# Patient Record
Sex: Female | Born: 1985 | Race: White | Hispanic: No | Smoking: Current every day smoker
Health system: Southern US, Community
[De-identification: ages and names within clinical notes are randomized; demographics above are authoritative.]

## PROBLEM LIST (undated history)

## (undated) ENCOUNTER — Ambulatory Visit: Discharge status: Absent without leave | Payer: 59

## (undated) DIAGNOSIS — Z8742 Personal history of other diseases of the female genital tract: Secondary | ICD-10-CM

## (undated) DIAGNOSIS — N200 Calculus of kidney: Secondary | ICD-10-CM

## (undated) DIAGNOSIS — J45909 Unspecified asthma, uncomplicated: Secondary | ICD-10-CM

## (undated) DIAGNOSIS — F419 Anxiety disorder, unspecified: Secondary | ICD-10-CM

## (undated) DIAGNOSIS — Z1379 Encounter for other screening for genetic and chromosomal anomalies: Secondary | ICD-10-CM

## (undated) DIAGNOSIS — G43909 Migraine, unspecified, not intractable, without status migrainosus: Secondary | ICD-10-CM

## (undated) DIAGNOSIS — R519 Headache, unspecified: Secondary | ICD-10-CM

## (undated) DIAGNOSIS — G932 Benign intracranial hypertension: Secondary | ICD-10-CM

## (undated) DIAGNOSIS — Z87442 Personal history of urinary calculi: Secondary | ICD-10-CM

## (undated) DIAGNOSIS — N390 Urinary tract infection, site not specified: Secondary | ICD-10-CM

## (undated) HISTORY — DX: Encounter for other screening for genetic and chromosomal anomalies: Z13.79

## (undated) HISTORY — PX: COLONOSCOPY WITH ESOPHAGOGASTRODUODENOSCOPY (EGD): SHX5779

## (undated) HISTORY — DX: Anxiety disorder, unspecified: F41.9

## (undated) HISTORY — PX: TONSILLECTOMY: SUR1361

## (undated) HISTORY — DX: Urinary tract infection, site not specified: N39.0

## (undated) HISTORY — DX: Calculus of kidney: N20.0

## (undated) HISTORY — PX: KNEE SURGERY: SHX244

## (undated) HISTORY — DX: Benign intracranial hypertension: G93.2

## (undated) HISTORY — DX: Headache, unspecified: R51.9

## (undated) HISTORY — DX: Personal history of other diseases of the female genital tract: Z87.42

---

## 2017-06-15 DIAGNOSIS — F419 Anxiety disorder, unspecified: Secondary | ICD-10-CM | POA: Insufficient documentation

## 2017-06-24 ENCOUNTER — Ambulatory Visit (INDEPENDENT_AMBULATORY_CARE_PROVIDER_SITE_OTHER): Payer: 59 | Admitting: Family Medicine

## 2017-06-24 ENCOUNTER — Encounter: Payer: Self-pay | Admitting: Family Medicine

## 2017-06-24 VITALS — BP 122/82 | HR 80 | Temp 97.6°F | Ht 66.5 in | Wt 256.0 lb

## 2017-06-24 DIAGNOSIS — Z13220 Encounter for screening for lipoid disorders: Secondary | ICD-10-CM

## 2017-06-24 DIAGNOSIS — Z23 Encounter for immunization: Secondary | ICD-10-CM | POA: Diagnosis not present

## 2017-06-24 DIAGNOSIS — N946 Dysmenorrhea, unspecified: Secondary | ICD-10-CM

## 2017-06-24 DIAGNOSIS — F410 Panic disorder [episodic paroxysmal anxiety] without agoraphobia: Secondary | ICD-10-CM

## 2017-06-24 DIAGNOSIS — Z7689 Persons encountering health services in other specified circumstances: Secondary | ICD-10-CM

## 2017-06-24 DIAGNOSIS — L709 Acne, unspecified: Secondary | ICD-10-CM | POA: Diagnosis not present

## 2017-06-24 DIAGNOSIS — Z86010 Personal history of colonic polyps: Secondary | ICD-10-CM

## 2017-06-24 DIAGNOSIS — L918 Other hypertrophic disorders of the skin: Secondary | ICD-10-CM | POA: Diagnosis not present

## 2017-06-24 DIAGNOSIS — Z6841 Body Mass Index (BMI) 40.0 and over, adult: Secondary | ICD-10-CM | POA: Diagnosis not present

## 2017-06-24 DIAGNOSIS — F339 Major depressive disorder, recurrent, unspecified: Secondary | ICD-10-CM

## 2017-06-24 DIAGNOSIS — G932 Benign intracranial hypertension: Secondary | ICD-10-CM

## 2017-06-24 LAB — COMPREHENSIVE METABOLIC PANEL
ALT: 15 U/L (ref 0–35)
AST: 12 U/L (ref 0–37)
Albumin: 4.5 g/dL (ref 3.5–5.2)
Alkaline Phosphatase: 60 U/L (ref 39–117)
BUN: 10 mg/dL (ref 6–23)
CO2: 26 mEq/L (ref 19–32)
Calcium: 9.9 mg/dL (ref 8.4–10.5)
Chloride: 109 mEq/L (ref 96–112)
Creatinine, Ser: 0.8 mg/dL (ref 0.40–1.20)
GFR: 87.71 mL/min (ref >60.00)
Glucose, Bld: 93 mg/dL (ref 70–99)
Potassium: 4.9 mEq/L (ref 3.5–5.1)
Sodium: 144 mEq/L (ref 135–145)
Total Bilirubin: 0.7 mg/dL (ref 0.2–1.2)
Total Protein: 7 g/dL (ref 6.0–8.3)

## 2017-06-24 LAB — HEMOGLOBIN A1C: Hgb A1c MFr Bld: 5.4 % (ref 4.6–6.5)

## 2017-06-24 LAB — VITAMIN D 25 HYDROXY (VIT D DEFICIENCY, FRACTURES): VITD: 44.12 ng/mL (ref 30.00–100.00)

## 2017-06-24 LAB — CBC
HCT: 45.2 % (ref 36.0–46.0)
Hemoglobin: 14.9 g/dL (ref 12.0–15.0)
MCHC: 32.9 g/dL (ref 30.0–36.0)
MCV: 91 fl (ref 78.0–100.0)
Platelets: 163 10*3/uL (ref 150.0–400.0)
RBC: 4.97 Mil/uL (ref 3.87–5.11)
RDW: 14 % (ref 11.5–15.5)
WBC: 8.1 10*3/uL (ref 4.0–10.5)

## 2017-06-24 LAB — LIPID PANEL
CHOL/HDL RATIO: 6
Cholesterol: 189 mg/dL (ref 0–200)
HDL: 31.6 mg/dL — AB (ref >39.00)
LDL CALC: 132 mg/dL — AB (ref 0–99)
NONHDL: 157.34
TRIGLYCERIDES: 125 mg/dL (ref 0.0–149.0)
VLDL: 25 mg/dL (ref 0.0–40.0)

## 2017-06-24 LAB — TSH: TSH: 2.16 u[IU]/mL (ref 0.35–4.50)

## 2017-06-24 MED ORDER — ALPRAZOLAM 0.5 MG PO TABS: 0.5000 mg | ORAL_TABLET | Freq: Every day | ORAL | 0 refills | Status: DC

## 2017-06-24 NOTE — Progress Notes (Signed)
Subjective:    Patient ID: Yesenia Beasley, female    DOB: 1986/07/19, 31 y.o.   MRN: [id number]  HPI This is a 31 yo female who presents today to establish care. She is originally  from [place]. She moved her with her boyfriend from [place] last month. 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 Has two dogs, enjoys sports, crafts.   Pseudotumor cerebri- Has history of pseudotumor cerebri. Has regular opthalmologic care, but has not see neurologist in long time. Is getting name of local ophthalmologist from prior provider but requests referral to neuro.   Depression/anxiety- "predispositioned" to stress, has panic disorder. Has been in CBT for many years, has appointment with Dr. Ala Bent (psychiatrist) next week. She lost her mother in 2016, has been rough couple of years. Feels like higher dose of sertraline makes her flat. Currently taking 50 mg. Takes alprazolam 0.5 mg at bedtime. When she tries to decrease, she has vertigo, nausea, increased anxiety. Used to take 0.5 mg QID. Recently has had increased anxiety around getting her medications.   Nephrolithiasis- kidney stones 2008, no recent issues  GI problems started after kidney stones, had colonoscopy and was found to have polyps. - She is due for colonoscopy, requests referral   Skin tag- on left eyelid, wishes to have removed, no drainage, no redness, getting bigger  Adult acne- thinks related to increased stress  Dysmenorrhea- unable to use hormonal contraception due to pseudotumor cerebri. Currently uses condoms. Interested in copper IUD, will let me know is she wants referral.   Past Medical History:  Diagnosis Date  . Anxiety    Past Surgical History:  Procedure Laterality Date  . KNEE SURGERY Bilateral 2002,2003,2014   Family History  Problem Relation Age of Onset  . Cancer Mother   . Heart disease Father    Social History  Substance Use Topics  . Smoking status: Current Every Day Smoker  . Smokeless tobacco: Never Used  .  Alcohol use Yes     Comment: pcc      Review of Systems Per HPI    Objective:   Physical Exam Physical Exam  Constitutional: Oriented to person, place, and time. She appears well-developed and well-nourished.  HENT:  Head: Normocephalic and atraumatic.  Eyes: Conjunctivae are normal.  Neck: Normal range of motion. Neck supple.  Cardiovascular: Normal rate, regular rhythm and normal heart sounds.   Pulmonary/Chest: Effort normal and breath sounds normal.  Musculoskeletal: Normal range of motion.  Neurological: Alert and oriented to person, place, and time.  Skin: Skin is warm and dry. Skin tag on left upper eye lid. Scattered acne along jaw line.  Psychiatric: Normal mood and affect. Behavior is normal. Judgment and thought content normal.  Vitals reviewed.     BP 122/82 (BP Location: Left Arm, Patient Position: Sitting, Cuff Size: Large)   Pulse 80   Temp 97.6 F (36.4 C) (Oral)   Ht 5' 6.5" (1.689 m)   Wt 256 lb (116.1 kg)   LMP 06/12/2017   SpO2 96%   BMI 40.70 kg/m      Assessment & Plan:  1. Encounter to establish care - will request records - Discussed and encouraged healthy lifestyle choices- adequate sleep, regular exercise, stress management and healthy food choices.   2. Dysmenorrhea - CBC - Comprehensive metabolic panel - TSH  3. Skin tag - Ambulatory referral to Dermatology  4. Adult acne - Ambulatory referral to Dermatology  5. Panic disorder - [employer] revealed one fill  of alprazolam 06/08/17 for #30 - Discussed potential for abuse and tolerance - establish with psychiatry as planned - ALPRAZolam (XANAX) 0.5 MG tablet; Take 1 tablet (0.5 mg total) by mouth at bedtime.  Dispense: 30 tablet; Refill: 0 - TSH - encouraged regular exercise, adequate sleep, healthy food choices  6. BMI 40.0-44.9, adult (HCC) - CBC - Comprehensive metabolic panel - Hemoglobin A1c - Lipid panel - TSH - VITAMIN D 25 Hydroxy (Vit-D Deficiency,  Fractures)  7. Need for influenza vaccination - Flu Vaccine QUAD 6+ mos PF IM (Fluarix Quad PF)  8. Pseudotumor cerebri - she will let me know name of local opthalamologist - Ambulatory referral to Neurology  9. History of colon polyps - Ambulatory referral to Gastroenterology  - follow up in 3 months Clarene Reamer, FNP-BC  [facility]  06/25/2017 12:19 PM

## 2017-06-24 NOTE — Patient Instructions (Signed)
It was a pleasure to meet you today! I look forward to partnering with you for your health care needs  Please schedule a follow up appointment in 3 months  Return your paperwork so I can request records  I will put in referral to dermatology, neurology, gastroenterology  Please get name of opthomalogist and I will place referral

## 2017-06-25 DIAGNOSIS — F339 Major depressive disorder, recurrent, unspecified: Secondary | ICD-10-CM | POA: Insufficient documentation

## 2017-06-25 DIAGNOSIS — F410 Panic disorder [episodic paroxysmal anxiety] without agoraphobia: Secondary | ICD-10-CM | POA: Insufficient documentation

## 2017-06-25 DIAGNOSIS — Z86010 Personal history of colonic polyps: Secondary | ICD-10-CM | POA: Insufficient documentation

## 2017-06-25 DIAGNOSIS — G932 Benign intracranial hypertension: Secondary | ICD-10-CM | POA: Insufficient documentation

## 2017-06-25 DIAGNOSIS — N946 Dysmenorrhea, unspecified: Secondary | ICD-10-CM | POA: Insufficient documentation

## 2017-07-02 ENCOUNTER — Encounter: Payer: Self-pay | Admitting: Family Medicine

## 2017-07-03 ENCOUNTER — Telehealth: Payer: Self-pay | Admitting: Family Medicine

## 2017-07-03 ENCOUNTER — Other Ambulatory Visit: Payer: Self-pay | Admitting: Family Medicine

## 2017-07-03 MED ORDER — ALBUTEROL SULFATE HFA 108 (90 BASE) MCG/ACT IN AERS: 2.0000 | INHALATION_SPRAY | Freq: Four times a day (QID) | RESPIRATORY_TRACT | 2 refills | Status: DC | PRN

## 2017-07-03 NOTE — Telephone Encounter (Signed)
Pt is asking for a prescription for Pro Air rescue inhaler, due to insurance no longer covering Ventolin inhaler.Pt can not find her current inhaler, which Ventolin and wants to have an inhaler for the weekend. Pt is willing to have a refill of Ventolin, and pay the cost her insurance will not cover in order to have a rescue inhaler for the weekend.

## 2017-07-03 NOTE — Telephone Encounter (Signed)
Please call patient and let her know that I sent in refill of albuterol- pharmacy should fill whatever generic is on her formulary.

## 2017-07-03 NOTE — Telephone Encounter (Signed)
Copied from [place]. Topic: Quick Communication - See Telephone Encounter >> Jul 03, 2017 11:49 AM Bea Graff, NT wrote: CRM for notification. See Telephone encounter for:  07/03/17. Patient is needing refill of her rescue inhaler before the weekend. Insurance wants her to use ProAir. Please call pt when ready for pick up.

## 2017-07-03 NOTE — Telephone Encounter (Signed)
Called and spoke with pt. Understanding verbalized nothing further needed at this time.

## 2017-08-05 ENCOUNTER — Other Ambulatory Visit: Payer: Self-pay | Admitting: Family Medicine

## 2017-08-05 DIAGNOSIS — F410 Panic disorder [episodic paroxysmal anxiety] without agoraphobia: Secondary | ICD-10-CM

## 2017-08-11 ENCOUNTER — Telehealth: Payer: Self-pay | Admitting: Family Medicine

## 2017-08-11 ENCOUNTER — Other Ambulatory Visit: Payer: Self-pay | Admitting: Family Medicine

## 2017-08-11 DIAGNOSIS — F410 Panic disorder [episodic paroxysmal anxiety] without agoraphobia: Secondary | ICD-10-CM

## 2017-08-11 NOTE — Telephone Encounter (Signed)
Copied from [place] [id number]. Topic: Quick Communication - See Telephone Encounter >> Aug 11, 2017  4:12 PM Bea Graff, NT wrote: CRM for notification. See Telephone encounter for: Pt requesting a refill of her Xanax. [facility] [address]. She only has 1 pill left.  08/11/17.

## 2017-08-12 ENCOUNTER — Other Ambulatory Visit: Payer: Self-pay | Admitting: Family Medicine

## 2017-08-12 DIAGNOSIS — F410 Panic disorder [episodic paroxysmal anxiety] without agoraphobia: Secondary | ICD-10-CM

## 2017-08-12 MED ORDER — ALPRAZOLAM 0.5 MG PO TABS: 0.5000 mg | ORAL_TABLET | Freq: Every day | ORAL | 1 refills | Status: DC

## 2017-08-12 NOTE — Telephone Encounter (Signed)
Pt requesting xanax refill.

## 2017-08-12 NOTE — Telephone Encounter (Signed)
Per phone note pt requesting refill xanax; pt has one pill left. Last refilled # 30 on 06/24/17 and pt new pt established on 06/24/17.Please advise.

## 2017-08-12 NOTE — Progress Notes (Signed)
Prescription called in to pharmacy

## 2017-08-12 NOTE — Telephone Encounter (Signed)
I have placed order to be phoned in today.

## 2017-08-12 NOTE — Telephone Encounter (Signed)
See refill request 08/11/17.

## 2017-08-25 ENCOUNTER — Other Ambulatory Visit: Payer: Self-pay

## 2017-08-25 DIAGNOSIS — Z86010 Personal history of colonic polyps: Secondary | ICD-10-CM

## 2017-08-25 NOTE — Progress Notes (Signed)
Gastroenterology Pre-Procedure Review  Request Date: 10/05/17 Requesting Physician: Dr. Vicente Males  PATIENT REVIEW QUESTIONS: The patient responded to the following health history questions as indicated:    1. Are you having any GI issues? no 2. Do you have a personal history of Polyps? yes (3-5 years ago) 3. Do you have a family history of Colon Cancer or Polyps? no 4. Diabetes Mellitus? no 5. Joint replacements in the past 12 months?no 6. Major health problems in the past 3 months?no 7. Any artificial heart valves, MVP, or defibrillator?no    MEDICATIONS & ALLERGIES:    Patient reports the following regarding taking any anticoagulation/antiplatelet therapy:   Plavix, Coumadin, Eliquis, Xarelto, Lovenox, Pradaxa, Brilinta, or Effient? no Aspirin? no  Patient confirms/reports the following medications:  Current Outpatient Medications  Medication Sig Dispense Refill  . albuterol (PROVENTIL HFA;VENTOLIN HFA) 108 (90 Base) MCG/ACT inhaler Inhale 2 puffs into the lungs every 6 (six) hours as needed for wheezing or shortness of breath. 1 Inhaler 2  . ALPRAZolam (XANAX) 0.5 MG tablet Take 1 tablet (0.5 mg total) by mouth at bedtime. 30 tablet 1  . sertraline (ZOLOFT) 50 MG tablet sertraline 50 mg tablet  1 tablet daily     No current facility-administered medications for this visit.     Patient confirms/reports the following allergies:  No Known Allergies  No orders of the defined types were placed in this encounter.   AUTHORIZATION INFORMATION Primary Insurance: 1D#: Group #:  Secondary Insurance: 1D#: Group #:  SCHEDULE INFORMATION: Date: 10/05/17 Time: Location:[facility]

## 2017-09-10 ENCOUNTER — Ambulatory Visit: Payer: 59 | Admitting: Neurology

## 2017-09-11 ENCOUNTER — Encounter: Payer: Self-pay | Admitting: Neurology

## 2017-10-05 ENCOUNTER — Other Ambulatory Visit: Payer: Self-pay

## 2017-10-05 ENCOUNTER — Ambulatory Visit
Admission: RE | Admit: 2017-10-05 | Discharge: 2017-10-05 | Disposition: A | Discharge status: Home / Self Care | Payer: 59 | Source: Ambulatory Visit | Attending: Gastroenterology | Admitting: Gastroenterology

## 2017-10-05 ENCOUNTER — Encounter: Payer: Self-pay | Admitting: Anesthesiology

## 2017-10-05 ENCOUNTER — Encounter
Admission: RE | Disposition: A | Discharge status: Home / Self Care | Payer: Self-pay | Source: Ambulatory Visit | Attending: Gastroenterology

## 2017-10-05 ENCOUNTER — Ambulatory Visit: Payer: 59 | Admitting: Anesthesiology

## 2017-10-05 DIAGNOSIS — F17200 Nicotine dependence, unspecified, uncomplicated: Secondary | ICD-10-CM | POA: Diagnosis not present

## 2017-10-05 DIAGNOSIS — Z8249 Family history of ischemic heart disease and other diseases of the circulatory system: Secondary | ICD-10-CM | POA: Insufficient documentation

## 2017-10-05 DIAGNOSIS — E669 Obesity, unspecified: Secondary | ICD-10-CM | POA: Diagnosis not present

## 2017-10-05 DIAGNOSIS — F419 Anxiety disorder, unspecified: Secondary | ICD-10-CM | POA: Diagnosis not present

## 2017-10-05 DIAGNOSIS — Z1211 Encounter for screening for malignant neoplasm of colon: Secondary | ICD-10-CM | POA: Insufficient documentation

## 2017-10-05 DIAGNOSIS — D125 Benign neoplasm of sigmoid colon: Secondary | ICD-10-CM | POA: Insufficient documentation

## 2017-10-05 DIAGNOSIS — Z809 Family history of malignant neoplasm, unspecified: Secondary | ICD-10-CM | POA: Insufficient documentation

## 2017-10-05 DIAGNOSIS — Z7951 Long term (current) use of inhaled steroids: Secondary | ICD-10-CM | POA: Insufficient documentation

## 2017-10-05 DIAGNOSIS — Z6841 Body Mass Index (BMI) 40.0 and over, adult: Secondary | ICD-10-CM | POA: Insufficient documentation

## 2017-10-05 DIAGNOSIS — Z86010 Personal history of colonic polyps: Secondary | ICD-10-CM | POA: Diagnosis not present

## 2017-10-05 DIAGNOSIS — F329 Major depressive disorder, single episode, unspecified: Secondary | ICD-10-CM | POA: Insufficient documentation

## 2017-10-05 DIAGNOSIS — Z79899 Other long term (current) drug therapy: Secondary | ICD-10-CM | POA: Diagnosis not present

## 2017-10-05 HISTORY — PX: COLONOSCOPY WITH PROPOFOL: SHX5780

## 2017-10-05 LAB — POCT PREGNANCY, URINE: Preg Test, Ur: NEGATIVE

## 2017-10-05 SURGERY — COLONOSCOPY WITH PROPOFOL
Anesthesia: General

## 2017-10-05 MED ORDER — PROPOFOL 500 MG/50ML IV EMUL
INTRAVENOUS | Status: AC
  Filled 2017-10-05: qty 50

## 2017-10-05 MED ORDER — PROPOFOL 10 MG/ML IV BOLUS
INTRAVENOUS | Status: DC | PRN
  Administered 2017-10-05: 30 mg via INTRAVENOUS
  Administered 2017-10-05: 20 mg via INTRAVENOUS
  Administered 2017-10-05: 40 mg via INTRAVENOUS
  Administered 2017-10-05: 80 mg via INTRAVENOUS

## 2017-10-05 MED ORDER — SODIUM CHLORIDE 0.9 % IV SOLN
INTRAVENOUS | Status: DC
  Administered 2017-10-05: 1000 mL via INTRAVENOUS

## 2017-10-05 MED ORDER — PROPOFOL 500 MG/50ML IV EMUL
INTRAVENOUS | Status: DC | PRN
  Administered 2017-10-05: 150 ug/kg/min via INTRAVENOUS

## 2017-10-05 NOTE — Op Note (Signed)
[facility] Patient Name: Yesenia Beasley Procedure Date: 10/05/2017 7:38 AM MRN: [id number] Account #: [id number] Date of Birth: 1986-01-02 Admit Type: Outpatient Age: 32 Room: [facility] ENDO ROOM 4 Gender: Female Note Status: Finalized Procedure:            Colonoscopy Indications:          High risk colon cancer surveillance: Personal history                        of colonic polyps Providers:            Jonathon Bellows MD, MD Referring MD:         Elby Beck (Referring MD) Medicines:            Monitored Anesthesia Care Complications:        No immediate complications. Procedure:            Pre-Anesthesia Assessment:                       - Prior to the procedure, a History and Physical was                        performed, and patient medications, allergies and                        sensitivities were reviewed. The patient's tolerance of                        previous anesthesia was reviewed.                       - The risks and benefits of the procedure and the                        sedation options and risks were discussed with the                        patient. All questions were answered and informed                        consent was obtained.                       - ASA Grade Assessment: III - A patient with severe                        systemic disease.                       After obtaining informed consent, the colonoscope was                        passed under direct vision. Throughout the procedure,                        the patient's blood pressure, pulse, and oxygen                        saturations were monitored continuously. The                        Colonoscope  was introduced through the anus and                        advanced to the the cecum, identified by the                        appendiceal orifice, IC valve and transillumination.                        The colonoscopy was performed with ease. The patient               tolerated the procedure well. The quality of the bowel                        preparation was good. Findings:      A 3 mm polyp was found in the sigmoid colon. The polyp was sessile. The       polyp was removed with a cold biopsy forceps. Resection and retrieval       were complete.      The exam was otherwise without abnormality on direct and retroflexion       views. Impression:           - One 3 mm polyp in the sigmoid colon, removed with a                        cold biopsy forceps. Resected and retrieved.                       - The examination was otherwise normal on direct and                        retroflexion views. Recommendation:       - Discharge patient to home (with spouse).                       - Resume previous diet.                       - Continue present medications.                       - Await pathology results.                       - Repeat colonoscopy in 5 years for surveillance.                       - I will refer her for genetic testing with the                        Oncology department as she is very young to have had                        polyps and multiple cancers affected her family members. Procedure Code(s):    --- Professional ---                       [phone], Colonoscopy, flexible; with biopsy, single or  multiple Diagnosis Code(s):    --- Professional ---                       Z86.010, Personal history of colonic polyps                       D12.5, Benign neoplasm of sigmoid colon CPT copyright 2016 American Medical Association. All rights reserved. The codes documented in this report are preliminary and upon coder review may  be revised to meet current compliance requirements. Jonathon Bellows, MD Jonathon Bellows MD, MD 10/05/2017 8:25:50 AM This report has been signed electronically. Number of Addenda: 0 Note Initiated On: 10/05/2017 7:38 AM Scope Withdrawal Time: 0 hours 13 minutes 48 seconds  Total Procedure  Duration: 0 hours 16 minutes 48 seconds       [facility]

## 2017-10-05 NOTE — Anesthesia Procedure Notes (Signed)
Date/Time: 10/05/2017 8:22 AM Performed by: Nelda Marseille, CRNA Pre-anesthesia Checklist: Patient identified, Emergency Drugs available, Suction available, Patient being monitored and Timeout performed Oxygen Delivery Method: Nasal cannula

## 2017-10-05 NOTE — Transfer of Care (Signed)
Immediate Anesthesia Transfer of Care Note  Patient: Karena Kinker  Procedure(s) Performed: COLONOSCOPY WITH PROPOFOL (N/A )  Patient Location: PACU  Anesthesia Type:General  Level of Consciousness: sedated  Airway & Oxygen Therapy: Patient Spontanous Breathing and Patient connected to nasal cannula oxygen  Post-op Assessment: Report given to RN and Post -op Vital signs reviewed and stable  Post vital signs: Reviewed and stable  Last Vitals:  Vitals:   10/05/17 0710 10/05/17 0827  BP: 124/86 107/70  Pulse: 95 77  Resp: (!) 97 (!) 22  Temp: 37.3 C (!) 35.9 C  SpO2: 97% 95%    Last Pain:  Vitals:   10/05/17 0827  TempSrc: Tympanic         Complications: No apparent anesthesia complications

## 2017-10-05 NOTE — Anesthesia Preprocedure Evaluation (Signed)
Anesthesia Evaluation  Patient identified by MRN, date of birth, ID band Patient awake    Reviewed: Allergy & Precautions, NPO status , Patient's Chart, lab work & pertinent test results, reviewed documented beta blocker date and time   Airway Mallampati: III  TM Distance: >3 FB     Dental  (+) Chipped   Pulmonary Current Smoker,           Cardiovascular      Neuro/Psych PSYCHIATRIC DISORDERS Anxiety Depression    GI/Hepatic   Endo/Other    Renal/GU      Musculoskeletal   Abdominal   Peds  Hematology   Anesthesia Other Findings Obese.  Reproductive/Obstetrics                             Anesthesia Physical Anesthesia Plan  ASA: III  Anesthesia Plan: General   Post-op Pain Management:    Induction: Intravenous  PONV Risk Score and Plan:   Airway Management Planned:   Additional Equipment:   Intra-op Plan:   Post-operative Plan:   Informed Consent: I have reviewed the patients History and Physical, chart, labs and discussed the procedure including the risks, benefits and alternatives for the proposed anesthesia with the patient or authorized representative who has indicated his/her understanding and acceptance.     Plan Discussed with: CRNA  Anesthesia Plan Comments:         Anesthesia Quick Evaluation

## 2017-10-05 NOTE — Anesthesia Postprocedure Evaluation (Signed)
Anesthesia Post Note  Patient: Yesenia Beasley  Procedure(s) Performed: COLONOSCOPY WITH PROPOFOL (N/A )  Patient location during evaluation: Endoscopy Anesthesia Type: General Level of consciousness: awake and alert Pain management: pain level controlled Vital Signs Assessment: post-procedure vital signs reviewed and stable Respiratory status: spontaneous breathing, nonlabored ventilation, respiratory function stable and patient connected to nasal cannula oxygen Cardiovascular status: blood pressure returned to baseline and stable Postop Assessment: no apparent nausea or vomiting Anesthetic complications: no     Last Vitals:  Vitals:   10/05/17 0847 10/05/17 0857  BP: 122/72 110/75  Pulse: 72 61  Resp: 14 17  Temp:    SpO2: 100% 98%    Last Pain:  Vitals:   10/05/17 0827  TempSrc: Tympanic                 Jen Eppinger S

## 2017-10-05 NOTE — H&P (Signed)
  Jonathon Bellows, MD [address], [place], [place], [place], [address] [address], [place], [place], [place], [address] Phone: [phone]  Fax: [phone]  Primary Care Physician:  Elby Beck, FNP   Pre-Procedure History & Physical: HPI:  Yesenia Beasley is a 32 y.o. female is here for an colonoscopy.   Past Medical History:  Diagnosis Date  . Anxiety     Past Surgical History:  Procedure Laterality Date  . COLONOSCOPY WITH ESOPHAGOGASTRODUODENOSCOPY (EGD)    . KNEE SURGERY Bilateral 2002,2003,2014    Prior to Admission medications   Medication Sig Start Date End Date Taking? Authorizing Provider  ALPRAZolam Duanne Moron) 0.5 MG tablet Take 1 tablet (0.5 mg total) by mouth at bedtime. 08/12/17  Yes Elby Beck, FNP  sertraline (ZOLOFT) 50 MG tablet sertraline 50 mg tablet  1 tablet daily   Yes [provider]  albuterol (PROVENTIL HFA;VENTOLIN HFA) 108 (90 Base) MCG/ACT inhaler Inhale 2 puffs into the lungs every 6 (six) hours as needed for wheezing or shortness of breath. 07/03/17   Elby Beck, FNP    Allergies as of 08/25/2017  . (No Known Allergies)    Family History  Problem Relation Age of Onset  . Cancer Mother   . Heart disease Father     Social History   Socioeconomic History  . Marital status: Divorced    Spouse name: Not on file  . Number of children: Not on file  . Years of education: Not on file  . Highest education level: Not on file  Social Needs  . Financial resource strain: Not on file  . Food insecurity - worry: Not on file  . Food insecurity - inability: Not on file  . Transportation needs - medical: Not on file  . Transportation needs - non-medical: Not on file  Occupational History  . Not on file  Tobacco Use  . Smoking status: Current Every Day Smoker  . Smokeless tobacco: Never Used  Substance and Sexual Activity  . Alcohol use: Yes    Comment: pcc  . Drug use: No  . Sexual activity: Yes  Other Topics  Concern  . Not on file  Social History Narrative  . Not on file    Review of Systems: See HPI, otherwise negative ROS  Physical Exam: BP 124/86   Pulse 95   Temp 99.2 F (37.3 C) (Tympanic)   Resp (!) 97   Ht 5\' 5"  (1.651 m)   Wt 250 lb (113.4 kg)   SpO2 97%   BMI 41.60 kg/m  General:   Alert,  pleasant and cooperative in NAD Head:  Normocephalic and atraumatic. Neck:  Supple; no masses or thyromegaly. Lungs:  Clear throughout to auscultation, normal respiratory effort.    Heart:  +S1, +S2, Regular rate and rhythm, No edema. Abdomen:  Soft, nontender and nondistended. Normal bowel sounds, without guarding, and without rebound.   Neurologic:  Alert and  oriented x4;  grossly normal neurologically.  Impression/Plan: Yesenia Beasley is here for an colonoscopy to be performed for surveillance due to prior history of colon polyps   Risks, benefits, limitations, and alternatives regarding  colonoscopy have been reviewed with the patient.  Questions have been answered.  All parties agreeable.   Jonathon Bellows, MD  10/05/2017, 8:01 AM

## 2017-10-05 NOTE — Anesthesia Post-op Follow-up Note (Signed)
Anesthesia QCDR form completed.        

## 2017-10-06 ENCOUNTER — Encounter: Payer: Self-pay | Admitting: Gastroenterology

## 2017-10-06 LAB — SURGICAL PATHOLOGY

## 2017-10-07 ENCOUNTER — Other Ambulatory Visit: Payer: Self-pay

## 2017-10-08 ENCOUNTER — Telehealth: Payer: Self-pay | Admitting: Genetic Counselor

## 2017-10-08 NOTE — Telephone Encounter (Signed)
Dr. Vicente Males is referring Yesenia Beasley for genetic counseling due to a personal and family history of colon polyps. I spoke with her today to schedule this telegenetics visit to be done by phone and she opted for an appointment on Friday 10/09/17 at 10:30am.   Steele Berg, [place], [place] Genetic Counselor Phone: [phone]

## 2017-10-09 ENCOUNTER — Encounter: Payer: Self-pay | Admitting: Genetic Counselor

## 2017-10-09 ENCOUNTER — Other Ambulatory Visit: Payer: Self-pay | Admitting: Family Medicine

## 2017-10-09 ENCOUNTER — Telehealth: Payer: Self-pay | Admitting: Genetic Counselor

## 2017-10-09 DIAGNOSIS — F410 Panic disorder [episodic paroxysmal anxiety] without agoraphobia: Secondary | ICD-10-CM

## 2017-10-09 NOTE — Telephone Encounter (Signed)
[facility] Initial Visit    Patient Name: Yesenia Beasley Patient DOB: 02-17-1986 Patient Age: 32 y.o. Phone Call Date: 10/09/2017  Referring Provider: Jonathon Bellows, MD  Reason for Visit: Evaluate for hereditary susceptibility to cancer    Assessment and Plan:  . Yesenia Beasley's history may be somewhat suggestive of a hereditary predisposition to cancer, but it is unclear what type of polyps she and her father reportedly had in their 38s. If these were all hyperplastic, she does not have a very high likelihood of having a hereditary predisposition to cancer/polyps. Given her young age and other cancers in the family, a genetic evaluation is indicated.   . Testing is recommended to determine whether she has a pathogenic mutation that will impact her screening and risk-reduction for cancer. A negative result will be generally reassuring, but she understood the importance of following Dr. Georgeann Oppenheim recommendations for when to have her next colonoscopy..  . Yesenia Beasley wished to pursue genetic testing and will schedule a lab visit for a blood sample. Analysis will include the 83 genes on [facility]'s Multi-Cancer panel (ALK, APC, ATM, AXIN2, BAP1, BARD1, BLM, BMPR1A, BRCA1, BRCA2, BRIP1, CASR, CDC73, CDH1, CDK4, CDKN1B, CDKN1C, CDKN2A, CEBPA, CHEK2, CTNNA1, DICER1, DIS3L2, EGFR, EPCAM, FH, FLCN, GATA2, GPC3, GREM1, HOXB13, HRAS, KIT, MAX, MEN1, MET, MITF, MLH1, MSH2, MSH3, MSH6, MUTYH, NBN, NF1, NF2, NTHL1, PALB2, PDGFRA, PHOX2B, PMS2, POLD1, POLE, POT1, PRKAR1A, PTCH1, PTEN, RAD50, RAD51C, RAD51D, RB1, RECQL4, RET, RUNX1, SDHA, SDHAF2, SDHB, SDHC, SDHD, SMAD4, SMARCA4, SMARCB1, SMARCE1, STK11, SUFU, TERC, TERT, TMEM127, TP53, TSC1, TSC2, VHL, WRN, WT1).   . Results should be available in approximately 2-3 weeks, at which point we will contact her and address implications for her as well as address genetic testing for at-risk family members, if needed.     Dr. Grayland Ormond  was available for questions concerning this case. Total time spent by counseling by phone was approximately 35 minutes.   _____________________________________________________________________   History of Present Illness: Yesenia Beasley, a 32 y.o. female, was referred for genetic counseling to discuss the possibility of a hereditary predisposition to cancer and discuss whether genetic testing is warranted. This was a telegenetics visit via phone.  Yesenia Beasley has no personal history of cancer. She recently had a colonoscopy where one polyp was removed from the sigmoid colon. Pathology was not yet available. She reported that at age 40 she had a colonoscopy due to GI issues and an anal fissure. This was done when she was living in [place]. She stated that 6 polyps were removed, but she did not know what type(s). At that time, she was told to return for another colonoscopy in 5 years.  Past Medical History:  Diagnosis Date  . Anxiety     Past Surgical History:  Procedure Laterality Date  . COLONOSCOPY WITH ESOPHAGOGASTRODUODENOSCOPY (EGD)    . COLONOSCOPY WITH PROPOFOL N/A 10/05/2017   Procedure: COLONOSCOPY WITH PROPOFOL;  Surgeon: Jonathon Bellows, MD;  Location: [facility] ENDOSCOPY;  Service: Gastroenterology;  Laterality: N/A;  . KNEE SURGERY Bilateral 2002,2003,2014    Family History: Significant diagnoses include the following:  Family History  Problem Relation Age of Onset  . Leukemia Mother        CMML; dx 31s; deceased 75  . Heart disease Father   . Colon polyps Father        initially in  7s; #/type unknown  . Wilm's tumor Sister        deceased at 2  . Brain cancer Maternal Grandfather        astrocytoma; deceased 83  . Heart attack Paternal Grandfather        deceased at 18  . Thyroid cancer Maternal Uncle        dx 54s  . Non-Hodgkin's lymphoma Maternal Aunt     Additionally, [name] has no children. Her living sisters are fraternal twins (age 64). Her mother had a  sister and 3 brothers. Her father had only one sister. There are no cancers in any of her grandparents.  Yesenia Beasley ancestry is Caucasian - NOS. There is no known Jewish ancestry and no consanguinity.  Discussion: We reviewed the characteristics, features and inheritance patterns of hereditary cancer syndromes with a focus on genes associated with hereditary colon cancer and polyps. We discussed her risk of harboring a mutation in the context of her personal and family history. We discussed the process of genetic testing, insurance coverage and implications of results: positive, negative and variant of unknown significance (VUS).   Yesenia Beasley questions were answered to her satisfaction today and she is welcome to call with any additional questions or concerns. Thank you for the referral and allowing [place] to share in the care of your patient.    Steele Berg, MS, [place] Certified Genetic Counselor phone: [phone]

## 2017-10-12 NOTE — Telephone Encounter (Signed)
Patient would like a refill for Alprazolam. Last refill 08/12/2017. Last OV 06/24/2017. Please advise. Thank you.

## 2017-10-18 ENCOUNTER — Encounter: Payer: Self-pay | Admitting: Gastroenterology

## 2017-10-21 ENCOUNTER — Inpatient Hospital Stay: Payer: 59 | Attending: Oncology

## 2017-11-09 ENCOUNTER — Encounter: Payer: Self-pay | Admitting: Genetic Counselor

## 2017-11-09 ENCOUNTER — Telehealth: Payer: Self-pay | Admitting: Genetic Counselor

## 2017-11-09 DIAGNOSIS — Z1379 Encounter for other screening for genetic and chromosomal anomalies: Secondary | ICD-10-CM

## 2017-11-09 HISTORY — DX: Encounter for other screening for genetic and chromosomal anomalies: Z13.79

## 2017-11-09 NOTE — Telephone Encounter (Signed)
Cancer Genetics             Telegenetics Results Disclosure   Patient Name: Yesenia Beasley Patient DOB: 1986/03/13 Patient Age: 32 y.o. Phone Call Date: 11/09/2017  Referring Provider: Jonathon Bellows, MD    [name] was called today to discuss genetic test results. Please see the Genetics telephone note from 10/09/2017 for a detailed discussion of her personal and family histories and the recommendations provided.  Genetic Testing: At the time of [name]'s telegenetics visit, she decided to pursue genetic testing of multiple genes associated with hereditary susceptibility to cancer. Testing included sequencing and deletion/duplication analysis. Testing did not reveal a pathogenic mutation in any of the genes analyzed.  A copy of the genetic test report will be scanned into Epic under the Media tab.  The genes analyzed were the 83 genes on [facility]'s Multi-Cancer panel (ALK, APC, ATM, AXIN2, BAP1, BARD1, BLM, BMPR1A, BRCA1, BRCA2, BRIP1, CASR, CDC73, CDH1, CDK4, CDKN1B, CDKN1C, CDKN2A, CEBPA, CHEK2, CTNNA1, DICER1, DIS3L2, EGFR, EPCAM, FH, FLCN, GATA2, GPC3, GREM1, HOXB13, HRAS, KIT, MAX, MEN1, MET, MITF, MLH1, MSH2, MSH3, MSH6, MUTYH, NBN, NF1, NF2, NTHL1, PALB2, PDGFRA, PHOX2B, PMS2, POLD1, POLE, POT1, PRKAR1A, PTCH1, PTEN, RAD50, RAD51C, RAD51D, RB1, RECQL4, RET, RUNX1, SDHA, SDHAF2, SDHB, SDHC, SDHD, SMAD4, SMARCA4, SMARCB1, SMARCE1, STK11, SUFU, TERC, TERT, TMEM127, TP53, TSC1, TSC2, VHL, WRN, WT1).  Since the current test is not perfect, it is possible that there may be a gene mutation that current testing cannot detect, but that chance is small. It is possible that a different genetic factor, which has not yet been discovered or is not on this panel, is responsible for the cancer diagnoses in the family. Again, the likelihood of this is low. No additional testing is recommended at this time for [name].  Cancer Screening: These results are generally reassuring. [name]  is recommended to follow the cancer screening guidelines provided by her physicians.   Family Members: Family members may be at some increased risk of developing colon polyps and cancer, over the general population risk, simply due to the family history. They are recommended to speak with their own providers about appropriate cancer screenings.  Any relative who had cancer at a young age or had a particularly rare cancer may also wish to pursue genetic testing. Genetic counselors can be located in other cities, by visiting the website of the [employer] ([employer]) and Field seismologist for a Dietitian by zip code.    Lastly, cancer genetics is a rapidly advancing field and it is possible that new genetic tests will be appropriate for [name] in the future. We encourage [name] to remain in contact with Genetics on an annual basis so her personal and family histories can be updated.    Steele Berg, MS, [place] Certified Genetic Counselor phone: [phone]

## 2018-01-05 ENCOUNTER — Encounter: Payer: Self-pay | Admitting: Gastroenterology

## 2018-01-05 ENCOUNTER — Ambulatory Visit (INDEPENDENT_AMBULATORY_CARE_PROVIDER_SITE_OTHER): Payer: 59 | Admitting: Gastroenterology

## 2018-01-05 VITALS — BP 118/83 | HR 76 | Ht 66.5 in | Wt 250.0 lb

## 2018-01-05 DIAGNOSIS — K219 Gastro-esophageal reflux disease without esophagitis: Secondary | ICD-10-CM | POA: Diagnosis not present

## 2018-01-05 MED ORDER — OMEPRAZOLE 40 MG PO CPDR: 40.0000 mg | DELAYED_RELEASE_CAPSULE | Freq: Every day | ORAL | 3 refills | Status: DC

## 2018-01-05 NOTE — Patient Instructions (Signed)

## 2018-01-05 NOTE — Progress Notes (Signed)
Jonathon Bellows MD, MRCP(U.K) [address]  [place], [place] [address]  Main: [phone]  Fax: [phone]   Gastroenterology Consultation  Referring Provider:     Elby Beck, FNP Primary Care Physician:  Elby Beck, FNP Primary Gastroenterologist:  Dr. Jonathon Bellows  Reason for Consultation:     GERD         HPI:   Zehava Beasley is a 32 y.o. y/o female referred for consultation & management  by Dr. Carlean Purl, Dalbert Batman, FNP.    She underwent a colonoscopy by me in 09/2017 due to personal history of colon polyps. I found one inflammatory polyp. Subsequent Genetic testing did not show any abnormalities for the cause of polyps at such a young age. She has been presently been referred for GERD.    Reflux:  Onset : Began a month - says she had an "attack " worst burning fire in her chest , started throwing up. She started taking prilosec, zantac, changed her diet , stopped coffee and says it took a few days to work and was feeling better. Stopped after 2 weeks of PPI and had another attack. Right now completed a month of prilosec - feels something at the back of her throat  Symptoms: as above  Recent weight gain: lost weight 10 lbs because she is terrified to eat  Medications: none  Narcotics or anticholinergics use : none  PPI /H2 blockers or Antacid  use and timing :none - Took prilosec intiially after meals but subsequently took it in the morning on an empty stomach  Dinner time : trying to eat earlier- not laying down right away   Prior EGD: yes many years back - didn't see anything  Family history of esophageal cancer:none.      Past Medical History:  Diagnosis Date  . Anxiety   . Genetic testing 11/09/2017   Multi-Cancer panel (83 genes) @ [facility] - No pathogenic mutations detected    Past Surgical History:  Procedure Laterality Date  . COLONOSCOPY WITH ESOPHAGOGASTRODUODENOSCOPY (EGD)    . COLONOSCOPY WITH PROPOFOL N/A 10/05/2017   Procedure:  COLONOSCOPY WITH PROPOFOL;  Surgeon: Jonathon Bellows, MD;  [facility];  Service: Gastroenterology;  Laterality: N/A;  . KNEE SURGERY Bilateral 2002,2003,2014    Prior to Admission medications   Medication Sig Start Date End Date Taking? Authorizing Provider  albuterol (PROVENTIL HFA;VENTOLIN HFA) 108 (90 Base) MCG/ACT inhaler Inhale 2 puffs into the lungs every 6 (six) hours as needed for wheezing or shortness of breath. 07/03/17   Elby Beck, FNP  ALPRAZolam Duanne Moron) 0.5 MG tablet TAKE 1 TABLET BY MOUTH AT BEDTIME 10/12/17   Elby Beck, FNP  Clindamycin Phosphate foam Apply ONCE daily TO affected AREA OF inner THIGHS 08/12/17   [provider]  doxycycline (VIBRAMYCIN) 100 MG capsule TAKE 1 CAPSULE BY MOUTH 2 TIMES EVERY DAY 11/08/17   [provider]  Doxycycline Hyclate 150 MG TABS TAKE ONE tablet by MOUTH ONCE daily WITH food AND drink 08/12/17   [provider]  predniSONE (DELTASONE) 20 MG tablet TAKE 2 TAB(S) ORALLY ONCE A DAY (IN THE MORNING) 11/01/17   [provider]  sertraline (ZOLOFT) 50 MG tablet sertraline 50 mg tablet  1 tablet daily    [provider]    Family History  Problem Relation Age of Onset  . Leukemia Mother        CMML; dx 18s; deceased 45  . Heart disease Father   .  Colon polyps Father        initially in 73s; #/type unknown  . Wilm's tumor Sister        deceased at 2  . Brain cancer Maternal Grandfather        astrocytoma; deceased 33  . Heart attack Paternal Grandfather        deceased at 26  . Thyroid cancer Maternal Uncle        dx 57s  . Non-Hodgkin's lymphoma Maternal Aunt      Social History   Tobacco Use  . Smoking status: Current Every Day Smoker  . Smokeless tobacco: Never Used  Substance Use Topics  . Alcohol use: Yes    Comment: pcc  . Drug use: No    Allergies as of 01/05/2018  . (No Known Allergies)    Review of Systems:    All systems reviewed and negative  except where noted in HPI.   Physical Exam:  There were no vitals taken for this visit. No LMP recorded. Psych:  Alert and cooperative. Normal mood and affect. General:   Alert,  Well-developed, well-nourished, pleasant and cooperative in NAD Head:  Normocephalic and atraumatic. Eyes:  Sclera clear, no icterus.   Conjunctiva pink. Ears:  Normal auditory acuity. Nose:  No deformity, discharge, or lesions. Mouth:  No deformity or lesions,oropharynx pink & moist. Neck:  Supple; no masses or thyromegaly. Lungs:  Respirations even and unlabored.  Clear throughout to auscultation.   No wheezes, crackles, or rhonchi. No acute distress. Heart:  Regular rate and rhythm; no murmurs, clicks, rubs, or gallops. Abdomen:  Normal bowel sounds.  No bruits.  Soft, non-tender and non-distended without masses, hepatosplenomegaly or hernias noted.  No guarding or rebound tenderness.    Neurologic:  Alert and oriented x3;  grossly normal neurologically. Skin:  Intact without significant lesions or rashes. No jaundice. Lymph Nodes:  No significant cervical adenopathy. Psych:  Alert and cooperative. Normal mood and affect.  Imaging Studies: No results found.  Assessment and Plan:   Yesenia Beasley is a 32 y.o. y/o female has been referred for GERD  GERD : Counseled on life style changes, suggest to use PPI first thing in the morning on empty stomach and eat 30 minutes after. Advised on the use of a wedge pillow at night , avoid meals for 2 hours prior to bed time. Weight loss .Discussed the risks and benefits of long term PPI use including but not limited to bone loss, chronic kidney disease, infections , low magnesium . Aim to use at the lowest dose for the shortest period of time . Commenced on Prilosec 40 ng daily and zantac 150 mg at night .If no better will consider an EGD  Follow up in 8 weeks  Dr Jonathon Bellows MD,MRCP(U.K)

## 2018-01-06 ENCOUNTER — Encounter: Payer: Self-pay | Admitting: Family Medicine

## 2018-01-06 ENCOUNTER — Ambulatory Visit (INDEPENDENT_AMBULATORY_CARE_PROVIDER_SITE_OTHER): Payer: 59 | Admitting: Family Medicine

## 2018-01-06 VITALS — BP 122/78 | HR 78 | Temp 97.7°F | Ht 66.5 in | Wt 250.5 lb

## 2018-01-06 DIAGNOSIS — F339 Major depressive disorder, recurrent, unspecified: Secondary | ICD-10-CM

## 2018-01-06 DIAGNOSIS — F410 Panic disorder [episodic paroxysmal anxiety] without agoraphobia: Secondary | ICD-10-CM | POA: Diagnosis not present

## 2018-01-06 DIAGNOSIS — K219 Gastro-esophageal reflux disease without esophagitis: Secondary | ICD-10-CM | POA: Insufficient documentation

## 2018-01-06 MED ORDER — ALPRAZOLAM 0.5 MG PO TABS: ORAL_TABLET | ORAL | 2 refills | Status: DC

## 2018-01-06 MED ORDER — SERTRALINE HCL 100 MG PO TABS: 100.0000 mg | ORAL_TABLET | Freq: Every day | ORAL | 3 refills | Status: DC

## 2018-01-06 NOTE — Progress Notes (Signed)
ooooooooooooooooooooooooooooooooooooooooooooooooooooooooooooooooooooooooooooooooooo

## 2018-01-06 NOTE — Patient Instructions (Signed)
Good to see you today!!  Hang in there.   For acute GERD, can try Gaviscon or Mylanta (liquid formulas that work quickly)  Please schedule follow up in 2 months to see how increased sertraline is working and also appointment in 6 months for complete physical  Please be in touch if worsening symptoms, or concerns.

## 2018-01-06 NOTE — Progress Notes (Signed)
Subjective:    Patient ID: Yesenia Beasley, female    DOB: 18-Sep-1985, 32 y.o.   MRN: [id number]  HPI This is a 32 yo female who presents today for follow up of GERD and anxiety.   GERD- has had several bad episodes. Saw GI (Dr. Jonathon Bellows) yesterday and was started on omeprazole 40 mg qam and ranitidine 150 mg qpm. Was also given recommendations about lifestyle modifications to decrease symptoms.   Anxiety- Has seen psychiatry in [place], can't remember name, but did not feel that it was a good fit. Has an appointment with different provider in June (not sure who). Continues to take alprazolam at bed time. Has occasionally taken an extra dose for panic attacks. She is under increased stress related to her position being eliminated at work and not knowing what is going to happen. Her boyfriend is very supportive. She is meditating and walking daily. She has many years of CBT and feels that she would not benefit from additional therapy at this time. She is requesting increase in sertraline from 50 mg to 100. Has previously been on 150 but was able to slowly wean herself down. Feels like she needs a little more to help with sense of panic she is currently having.   Past Medical History:  Diagnosis Date  . Anxiety   . Genetic testing 11/09/2017   Multi-Cancer panel (83 genes) @ [facility] - No pathogenic mutations detected   Past Surgical History:  Procedure Laterality Date  . COLONOSCOPY WITH ESOPHAGOGASTRODUODENOSCOPY (EGD)    . COLONOSCOPY WITH PROPOFOL N/A 10/05/2017   Procedure: COLONOSCOPY WITH PROPOFOL;  Surgeon: Jonathon Bellows, MD;  Location: [facility] ENDOSCOPY;  Service: Gastroenterology;  Laterality: N/A;  . KNEE SURGERY Bilateral 2002,2003,2014   Family History  Problem Relation Age of Onset  . Leukemia Mother        CMML; dx 67s; deceased 43  . Heart disease Father   . Colon polyps Father        initially in 63s; #/type unknown  . Wilm's tumor Sister        deceased at 2  . Brain cancer  Maternal Grandfather        astrocytoma; deceased 36  . Heart attack Paternal Grandfather        deceased at 60  . Thyroid cancer Maternal Uncle        dx 37s  . Non-Hodgkin's lymphoma Maternal Aunt    Social History   Tobacco Use  . Smoking status: Current Every Day Smoker  . Smokeless tobacco: Never Used  Substance Use Topics  . Alcohol use: Yes    Comment: pcc  . Drug use: No      Review of Systems Per HPI    Objective:   Physical Exam Physical Exam  Vitals reviewed. Constitutional: Oriented to person, place, and time. Appears well-developed and well-nourished.  HENT:  Head: Normocephalic and atraumatic.  Eyes: Conjunctivae are normal.  Neck: Normal range of motion. Neck supple.  Cardiovascular: Normal rate.   Pulmonary/Chest: Effort normal.  Musculoskeletal: Normal range of motion.  Neurological: Alert and oriented to person, place, and time.  Psychiatric: Normal mood and affect. Behavior is normal. Judgment and thought content normal.   BP 122/78 (BP Location: Right Arm, Patient Position: Sitting, Cuff Size: Large)   Pulse 78   Temp 97.7 F (36.5 C) (Oral)   Ht 5' 6.5" (1.689 m)   Wt 250 lb 8 oz (113.6 kg)   LMP 12/29/2017   SpO2 94%  BMI 39.83 kg/m  Wt Readings from Last 3 Encounters:  01/06/18 250 lb 8 oz (113.6 kg)  01/05/18 250 lb (113.4 kg)  10/05/17 250 lb (113.4 kg)   Depression screen [facility] 2/9 01/06/2018 06/24/2017  Decreased Interest 1 1  Down, Depressed, Hopeless 1 1  PHQ - 2 Score 2 2  Altered sleeping 3 2  Tired, decreased energy 2 2  Change in appetite 2 2  Feeling bad or failure about yourself  0 1  Trouble concentrating 1 2  Moving slowly or fidgety/restless 1 0  Suicidal thoughts 0 0  PHQ-9 Score 11 11  Difficult doing work/chores Somewhat difficult -       Assessment & Plan:  1. Panic disorder - encouraged continued self care measures - follow up with psychiatry as scheduled - will increase sertraline to 100 mg daily -  follow up in 2 months, sooner if worsening symptoms - ALPRAZolam (XANAX) 0.5 MG tablet; Take 1-2 tablets daily as needed for anxiety and sleep  Dispense: 45 tablet; Refill: 2 - sertraline (ZOLOFT) 100 MG tablet; Take 1 tablet (100 mg total) by mouth daily.  Dispense: 90 tablet; Refill: 3  2. Depression, recurrent ([place]) - sertraline (ZOLOFT) 100 MG tablet; Take 1 tablet (100 mg total) by mouth daily.  Dispense: 90 tablet; Refill: 3  3. Gastroesophageal reflux disease, esophagitis presence not specified - continue treatment and follow up per GI   Clarene Reamer, FNP-BC  [facility], [place]  01/06/2018 8:38 AM

## 2018-02-05 ENCOUNTER — Other Ambulatory Visit: Payer: Self-pay | Admitting: Family Medicine

## 2018-02-26 ENCOUNTER — Encounter (HOSPITAL_COMMUNITY): Payer: Self-pay | Admitting: Emergency Medicine

## 2018-02-26 ENCOUNTER — Emergency Department (HOSPITAL_COMMUNITY)
Admission: EM | Admit: 2018-02-26 | Discharge: 2018-02-26 | Disposition: A | Discharge status: Home / Self Care | Payer: 59 | Attending: Emergency Medicine | Admitting: Emergency Medicine

## 2018-02-26 ENCOUNTER — Emergency Department (HOSPITAL_COMMUNITY): Payer: 59

## 2018-02-26 DIAGNOSIS — Z79899 Other long term (current) drug therapy: Secondary | ICD-10-CM | POA: Insufficient documentation

## 2018-02-26 DIAGNOSIS — F17210 Nicotine dependence, cigarettes, uncomplicated: Secondary | ICD-10-CM | POA: Insufficient documentation

## 2018-02-26 DIAGNOSIS — R079 Chest pain, unspecified: Secondary | ICD-10-CM | POA: Diagnosis present

## 2018-02-26 LAB — CBC
HCT: 46.9 % — ABNORMAL HIGH (ref 36.0–46.0)
Hemoglobin: 15.1 g/dL — ABNORMAL HIGH (ref 12.0–15.0)
MCH: 28.3 pg (ref 26.0–34.0)
MCHC: 32.2 g/dL (ref 30.0–36.0)
MCV: 88 fL (ref 78.0–100.0)
PLATELETS: 179 10*3/uL (ref 150–400)
RBC: 5.33 MIL/uL — AB (ref 3.87–5.11)
RDW: 12.7 % (ref 11.5–15.5)
WBC: 8.5 10*3/uL (ref 4.0–10.5)

## 2018-02-26 LAB — I-STAT TROPONIN, ED
TROPONIN I, POC: 0.01 ng/mL (ref 0.00–0.08)
Troponin i, poc: 0 ng/mL (ref 0.00–0.08)

## 2018-02-26 LAB — BASIC METABOLIC PANEL
Anion gap: 8 (ref 5–15)
BUN: 9 mg/dL (ref 6–20)
CALCIUM: 9.2 mg/dL (ref 8.9–10.3)
CHLORIDE: 108 mmol/L (ref 101–111)
CO2: 22 mmol/L (ref 22–32)
CREATININE: 0.86 mg/dL (ref 0.44–1.00)
GFR calc non Af Amer: >60 mL/min (ref >60)
Glucose, Bld: 100 mg/dL — ABNORMAL HIGH (ref 65–99)
Potassium: 4.2 mmol/L (ref 3.5–5.1)
SODIUM: 138 mmol/L (ref 135–145)

## 2018-02-26 LAB — I-STAT BETA HCG BLOOD, ED (MC, WL, AP ONLY)

## 2018-02-26 IMAGING — CR DG CHEST 2V
2 series · 2 of 2 positions shown · non-contrast
Comparison: No prior.

CLINICAL DATA: Chest pain.

EXAM:
CHEST - 2 VIEW

[chest pa]
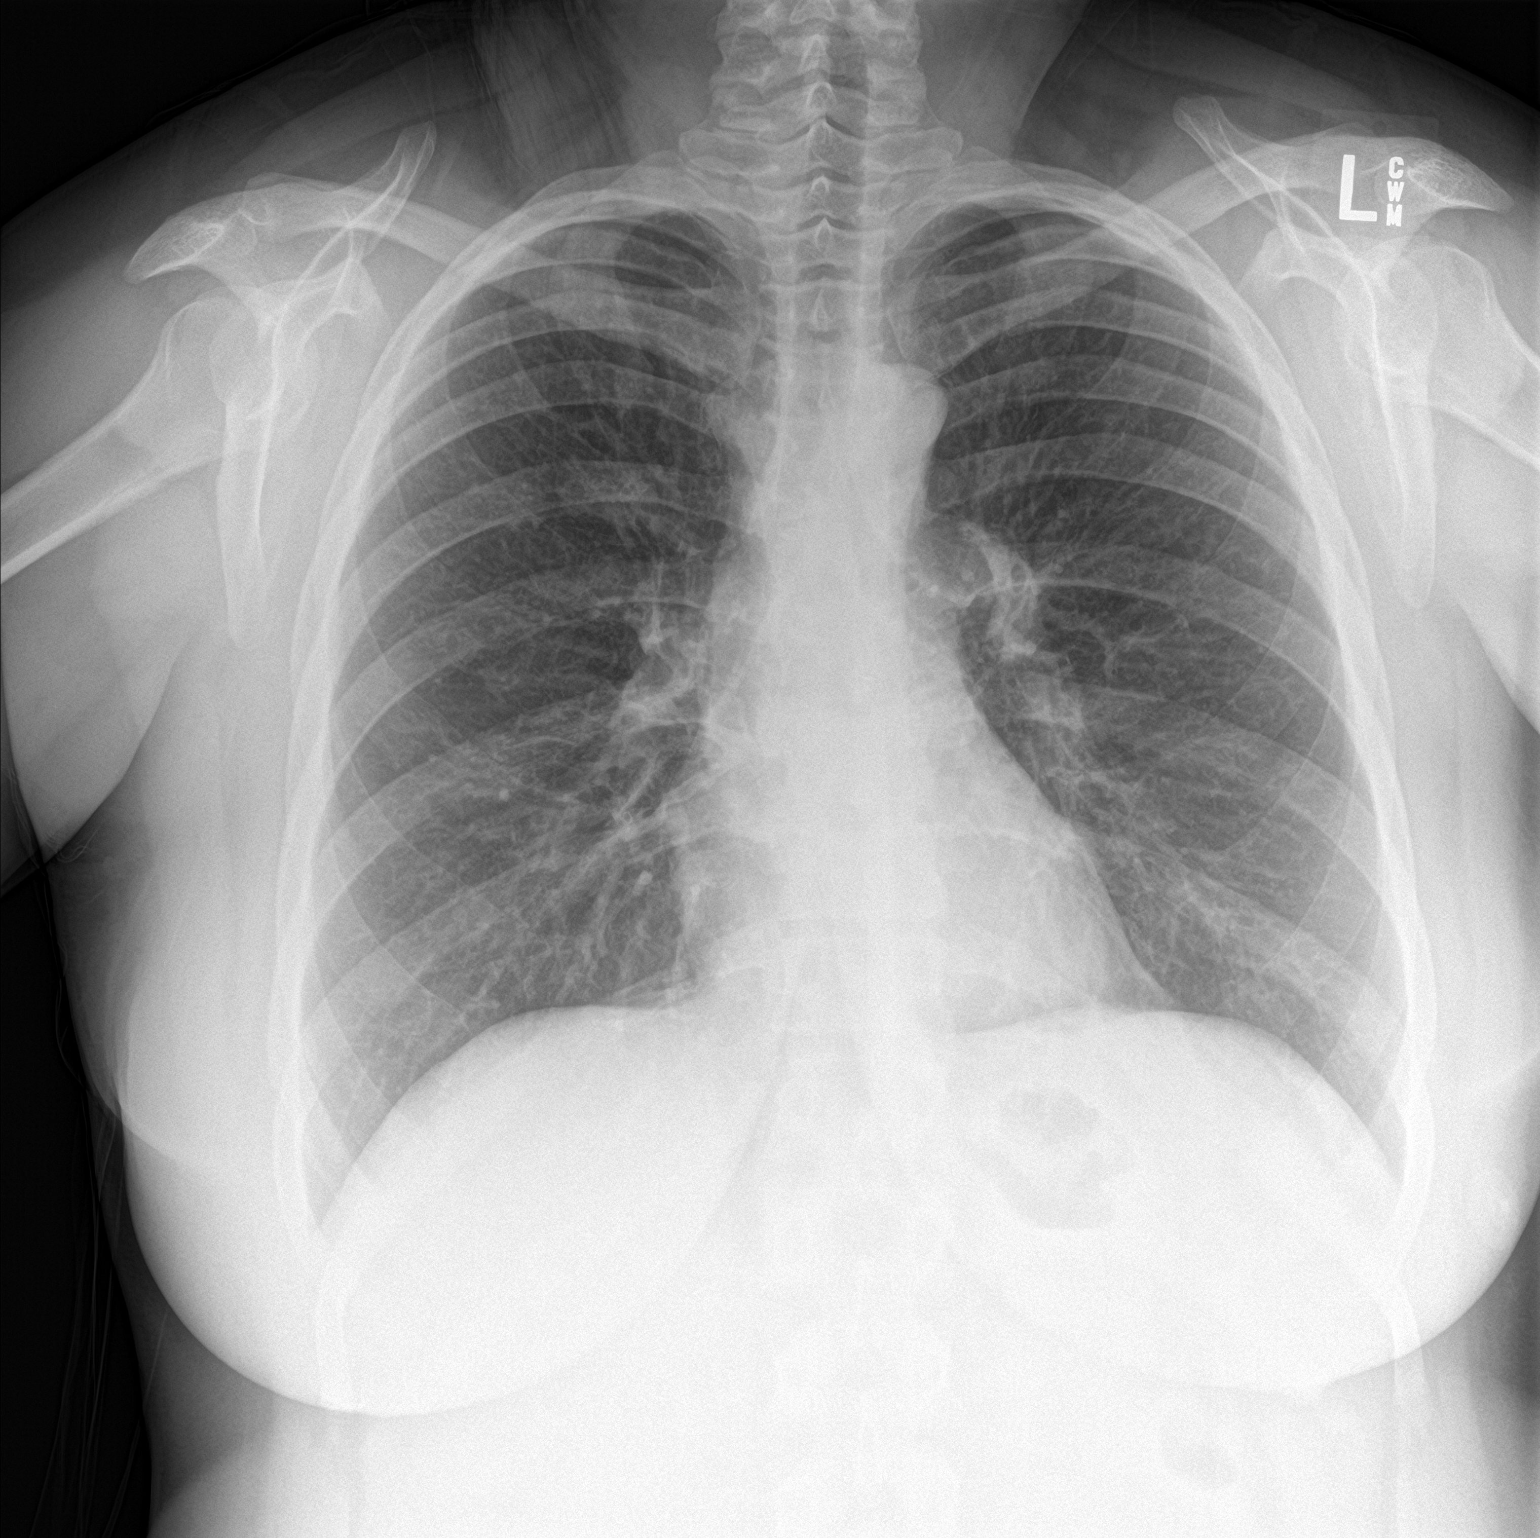

[chest lat]
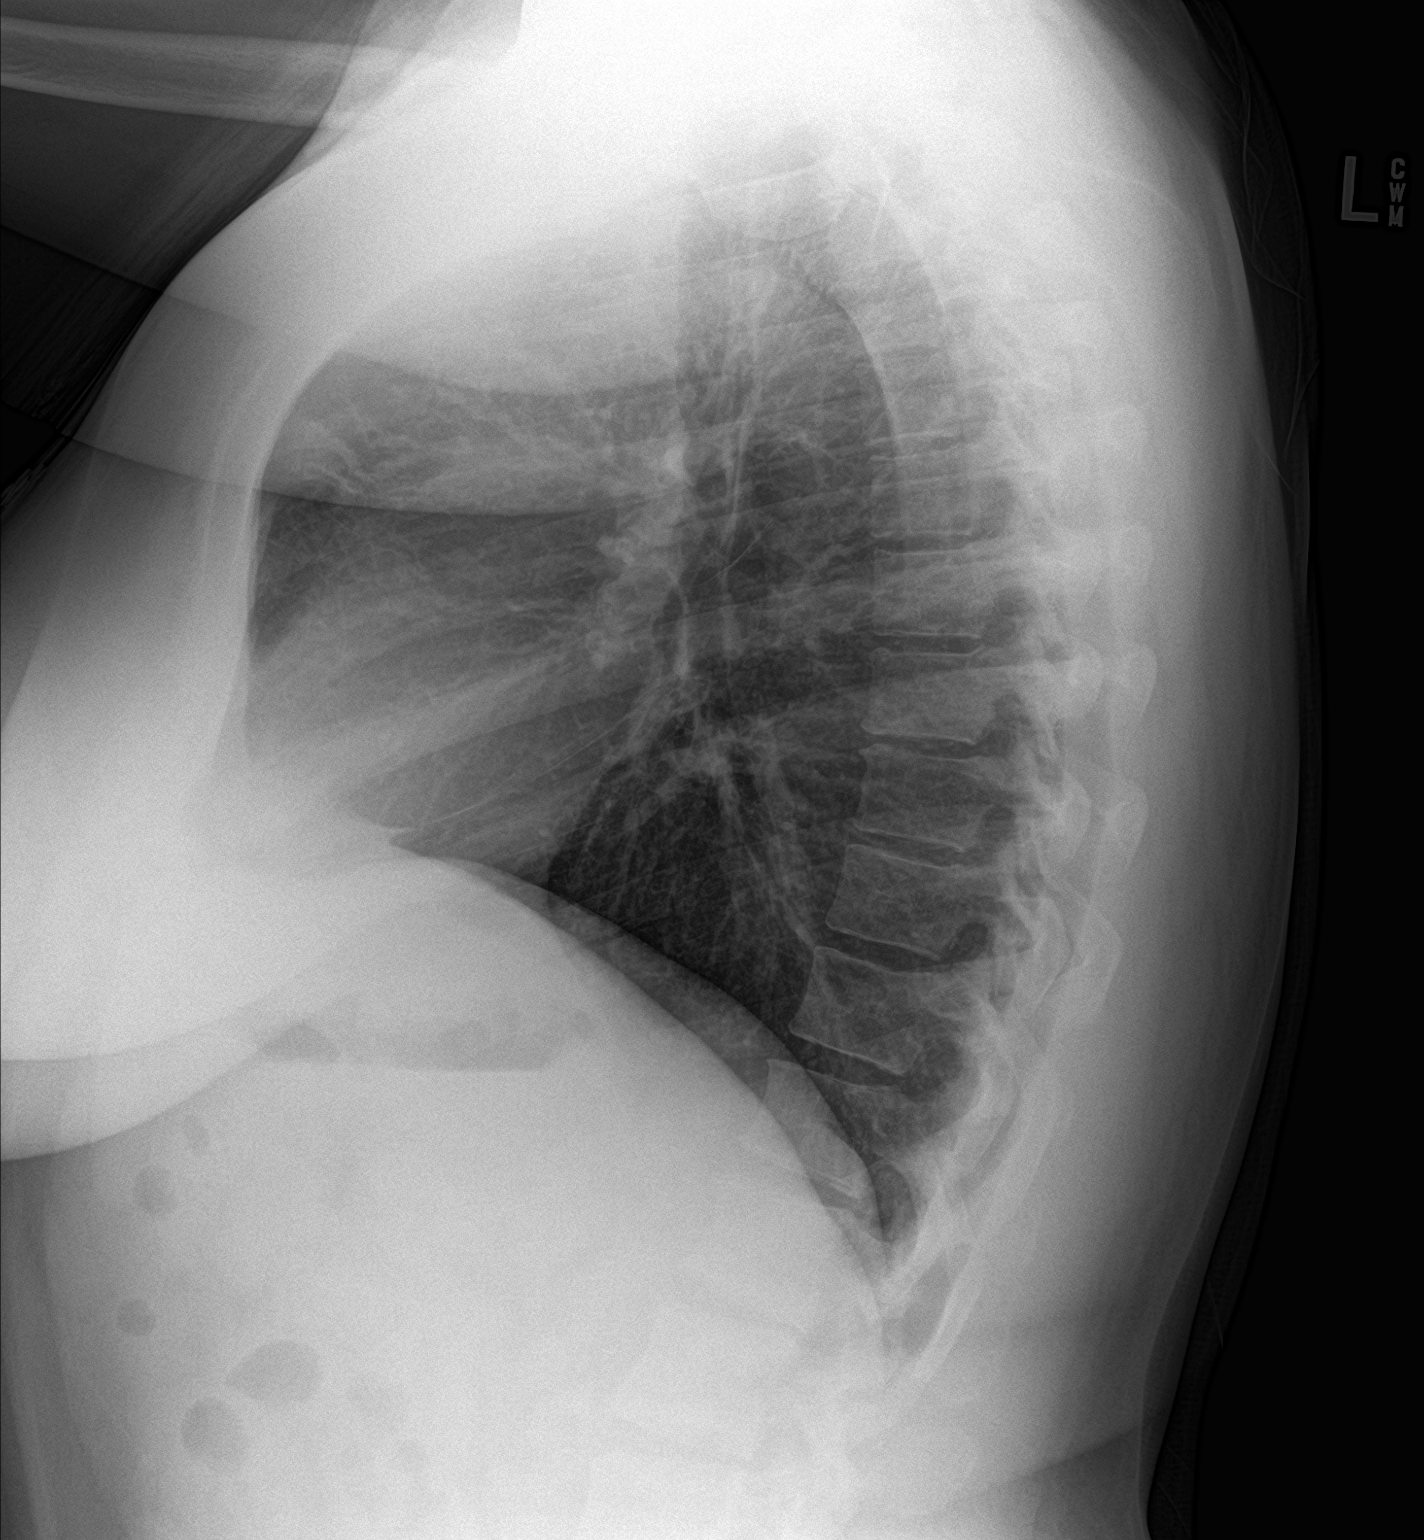

[2 of 2 positions shown; findings below may reference images not displayed]

FINDINGS: Mediastinum and hilar structures normal. Heart size normal. Mild
atelectatic changes noted anteriorly over the lungs on lateral view.
Atelectatic changes may be in the left perihilar region. No focal
alveolar infiltrate. No pleural effusion or pneumothorax.
IMPRESSION: Mild atelectatic changes noted anteriorly over the lungs on lateral
view. Atelectatic changes may be in the left perihilar region. No
focal alveolar infiltrate. Heart size normal.

## 2018-02-26 MED ORDER — METHOCARBAMOL 500 MG PO TABS: 500.0000 mg | ORAL_TABLET | Freq: Two times a day (BID) | ORAL | 0 refills | Status: DC

## 2018-02-26 MED ORDER — ALBUTEROL SULFATE (2.5 MG/3ML) 0.083% IN NEBU
5.0000 mg | INHALATION_SOLUTION | Freq: Once | RESPIRATORY_TRACT | Status: AC
Start: 2018-02-26 — End: 2018-02-26
  Administered 2018-02-26: 5 mg via RESPIRATORY_TRACT
  Filled 2018-02-26: qty 6

## 2018-02-26 MED ORDER — IPRATROPIUM BROMIDE 0.02 % IN SOLN
0.5000 mg | Freq: Once | RESPIRATORY_TRACT | Status: AC
  Administered 2018-02-26: 0.5 mg via RESPIRATORY_TRACT
  Filled 2018-02-26: qty 2.5

## 2018-02-26 NOTE — ED Provider Notes (Signed)
[place] EMERGENCY DEPARTMENT Provider Note   CSN: [id number] Arrival date & time: 02/26/18  1052     History   Chief Complaint Chief Complaint  Patient presents with  . Chest Pain    HPI Yesenia Beasley is a 32 y.o. female with a history of pseudotumor cerebri, asthma, history of colon polyps, dysmenorrhea, panic disorder, anxiety, and depression who presents to the emergency department with a chief complaint of chest pain.  The patient endorses left-sided chest pain that began last night and has been constant since onset.  She reports that after the onset of the left-sided chest pain that she noticed that it began to radiate to the right side of her chest.  She characterizes the pain as achy.  She also endorses some right-sided rib pain that radiates to the right posterior ribs.  No known aggravating or alleviating factors.  She denies dyspnea, neck pain, pain down the left arm, but she does state that her left arm has intermittently felt tingly, but she thinks that it may be related to her anxiety because the numbness did not start until after she started thinking about coming to the ED this morning. Tingling has since resolved.  He also reports that she felt kind of clammy when the pain began, which is since improved.  She has a history of GERD, but states that the current symptoms feel different than her heartburn.  She denies abdominal pain, nausea, vomiting, diarrhea, back pain, palpitations, cough, or leg swelling.  No treatment prior to arrival.  No history of similar pain.  She denies recent dyspnea or chest pain with exertion.  She states that her paternal grandfather passed away at age 19 from an MI and her father had an MI in his late 8s.  She is followed by gastroenterology and primary care but does not have a cardiologist.  The history is provided by the patient. No language interpreter was used.  Chest Pain   Associated symptoms include numbness. Pertinent  negatives include no abdominal pain, no back pain, no cough, no fever, no headaches, no nausea, no shortness of breath, no vomiting and no weakness.    Past Medical History:  Diagnosis Date  . Anxiety   . Genetic testing 11/09/2017   Multi-Cancer panel (83 genes) @ [facility] - No pathogenic mutations detected    Patient Active Problem List   Diagnosis Date Noted  . Gastroesophageal reflux disease 01/06/2018  . Genetic testing 11/09/2017  . Dysmenorrhea 06/25/2017  . Panic disorder 06/25/2017  . Pseudotumor cerebri 06/25/2017  . History of colon polyps 06/25/2017  . Depression, recurrent ([place]) 06/25/2017  . Anxiety 06/15/2017    Past Surgical History:  Procedure Laterality Date  . COLONOSCOPY WITH ESOPHAGOGASTRODUODENOSCOPY (EGD)    . COLONOSCOPY WITH PROPOFOL N/A 10/05/2017   Procedure: COLONOSCOPY WITH PROPOFOL;  Surgeon: Jonathon Bellows, MD;  Location: [facility] ENDOSCOPY;  Service: Gastroenterology;  Laterality: N/A;  . KNEE SURGERY Bilateral 2002,2003,2014     OB History   None      Home Medications    Prior to Admission medications   Medication Sig Start Date End Date Taking? Authorizing Provider  ALPRAZolam Duanne Moron) 0.5 MG tablet Take 1-2 tablets daily as needed for anxiety and sleep 01/06/18   Elby Beck, FNP  Clindamycin Phosphate foam Apply ONCE daily TO affected AREA OF inner THIGHS 08/12/17   [provider]  omeprazole (PRILOSEC) 40 MG capsule Take 1 capsule (40 mg total) by mouth daily. 01/05/18 03/07/18  Jonathon Bellows, MD  PROAIR HFA 108 [phone] Base) MCG/ACT inhaler TAKE 2 PUFFS BY MOUTH EVERY 6 HOURS AS NEEDED FOR WHEEZE OR SHORTNESS OF BREATH 02/05/18   Elby Beck, FNP  sertraline (ZOLOFT) 100 MG tablet Take 1 tablet (100 mg total) by mouth daily. 01/06/18   Elby Beck, FNP    Family History Family History  Problem Relation Age of Onset  . Leukemia Mother        CMML; dx 73s; deceased 71  . Heart disease Father   . Colon polyps Father         initially in 74s; #/type unknown  . Wilm's tumor Sister        deceased at 2  . Brain cancer Maternal Grandfather        astrocytoma; deceased 5  . Heart attack Paternal Grandfather        deceased at 2  . Thyroid cancer Maternal Uncle        dx 47s  . Non-Hodgkin's lymphoma Maternal Aunt     Social History Social History   Tobacco Use  . Smoking status: Current Every Day Smoker  . Smokeless tobacco: Never Used  Substance Use Topics  . Alcohol use: Yes    Comment: pcc  . Drug use: No     Allergies   Patient has no known allergies.   Review of Systems Review of Systems  Constitutional: Negative for activity change, chills and fever.  Respiratory: Negative for cough and shortness of breath.   Cardiovascular: Positive for chest pain.  Gastrointestinal: Negative for abdominal distention, abdominal pain, blood in stool, diarrhea, nausea and vomiting.  Genitourinary: Negative for dysuria.  Musculoskeletal: Negative for back pain.  Skin: Negative for rash.  Allergic/Immunologic: Negative for immunocompromised state.  Neurological: Positive for numbness. Negative for syncope, weakness and headaches.  Psychiatric/Behavioral: Negative for confusion.     Physical Exam Updated Vital Signs BP 110/71   Pulse 63   Temp 98.1 F (36.7 C)   Resp 12   LMP 02/26/2018   SpO2 100%   Physical Exam  Constitutional: No distress.  Comfortable appearing.  No acute distress.  HENT:  Head: Normocephalic.  Eyes: Pupils are equal, round, and reactive to light. Conjunctivae and EOM are normal. Right eye exhibits no discharge. Left eye exhibits no discharge. No scleral icterus.  Neck: Normal range of motion. Neck supple. No JVD present.  Cardiovascular: Normal rate, regular rhythm, normal heart sounds and intact distal pulses. Exam reveals no gallop and no friction rub.  No murmur heard. Pulmonary/Chest: Effort normal. No stridor. No respiratory distress. She has wheezes. She has no  rales. She exhibits no tenderness.  Wheezing present in the bilateral mid lungs and bases with inspiration and expiration.  Abdominal: Soft. She exhibits no distension and no mass. There is no tenderness. There is no rebound and no guarding. No hernia.  Musculoskeletal: Normal range of motion. She exhibits no edema, tenderness or deformity.  No lower extremity edema.  Neurological: She is alert.  Skin: Skin is warm. No rash noted. She is not diaphoretic.  Psychiatric: Her behavior is normal.  Nursing note and vitals reviewed.  ED Treatments / Results  Labs (all labs ordered are listed, but only abnormal results are displayed) Labs Reviewed  BASIC METABOLIC PANEL - Abnormal; Notable for the following components:      Result Value   Glucose, Bld 100 (*)    All other components within normal limits  CBC - Abnormal; Notable  for the following components:   RBC 5.33 (*)    Hemoglobin 15.1 (*)    HCT 46.9 (*)    All other components within normal limits  I-STAT TROPONIN, ED  I-STAT BETA HCG BLOOD, ED ([facility], [facility], [facility] ONLY)  I-STAT TROPONIN, ED    EKG EKG Interpretation  Date/Time:  Friday February 26 2018 10:58:23 EDT Ventricular Rate:  74 PR Interval:  142 QRS Duration: 78 QT Interval:  382 QTC Calculation: 424 R Axis:   46 Text Interpretation:  Normal sinus rhythm Cannot rule out Anterior infarct , age undetermined Abnormal ECG Confirmed by Dene Gentry [phone]) on 02/26/2018 12:48:01 PM   Radiology Dg Chest 2 View  Result Date: 02/26/2018 CLINICAL DATA:  Chest pain. EXAM: CHEST - 2 VIEW COMPARISON:  No prior. FINDINGS: Mediastinum and hilar structures normal. Heart size normal. Mild atelectatic changes noted anteriorly over the lungs on lateral view. Atelectatic changes may be in the left perihilar region. No focal alveolar infiltrate. No pleural effusion or pneumothorax. IMPRESSION: Mild atelectatic changes noted anteriorly over the lungs on lateral view. Atelectatic changes may be in  the left perihilar region. No focal alveolar infiltrate. Heart size normal. Electronically Signed   By: Marcello Moores  Register   On: 02/26/2018 11:32    Procedures Procedures (including critical care time)  Medications Ordered in ED Medications  albuterol (PROVENTIL) (2.5 MG/3ML) 0.083% nebulizer solution 5 mg (5 mg Nebulization Given 02/26/18 1349)  ipratropium (ATROVENT) nebulizer solution 0.5 mg (0.5 mg Nebulization Given 02/26/18 1349)     Initial Impression / Assessment and Plan / ED Course  I have reviewed the triage vital signs and the nursing notes.  Pertinent labs & imaging results that were available during my care of the patient were reviewed by me and considered in my medical decision making (see chart for details).     32 year old female with a history of pseudotumor cerebri, asthma, history of colon polyps, dysmenorrhea, panic disorder, anxiety, and depression presents to the emergency department with a chief complaint of chest pain, onset last night.  On exam, the patient did have wheezing in the bilateral lung fields.  DuoNeb given.  On re-evaluation, wheezing has resolved.  Patient is to be discharged with recommendation to follow up with PCP in regards to today's hospital visit. Chest pain is not likely of cardiac or pulmonary etiology d/t presentation, PERC negative, VSS, no tracheal deviation, no JVD or new murmur, RRR, breath sounds equal bilaterally, EKG without acute abnormalities, negative troponin, and negative CXR. Pt has been advised to return to the ED if CP becomes exertional, associated with diaphoresis or nausea, radiates to left jaw/arm, worsens or becomes concerning in any way.   Pt appears reliable for follow up and is agreeable to discharge.  Will discharge with Robaxin and Tylenol for musculoskeletal pain and follow-up to her PCP.  Final Clinical Impressions(s) / ED Diagnoses   Final diagnoses:  Nonspecific chest pain    ED Discharge Orders    None        Joanne Gavel, PA-C 02/26/18 1436    Valarie Merino, MD 02/28/18 1655

## 2018-02-26 NOTE — ED Triage Notes (Signed)
Pt reports centralized/left chest pain starting last night, 7/10 that is constant pressure. No shortness of breath, no nausea, or dizziness.

## 2018-02-26 NOTE — Discharge Instructions (Addendum)
Thank you for allowing me to provide your care today in the emergency department.  Take thousand milligrams of Tylenol every 8 hours for the next 5 days to see if your symptoms improve.  You can also take 1 tablet of Robaxin 2 times daily.  This is a muscle relaxer and can help with musculoskeletal pain.  Use 2 puffs of your albuterol every 4 hours as needed for wheezing or shortness of breath.  Follow-up with your primary care provider if your symptoms do not improve in the next 4 to 5 days with this regimen.  Return to the emergency department if you develop difficulty breathing, severe shortness of breath, chest pain that worsens with activity, if you pass out, or develop other new, concerning symptoms.

## 2018-02-26 NOTE — ED Notes (Signed)
Pt stable, ambulatory, states understanding of discharge instructions 

## 2018-03-05 ENCOUNTER — Encounter: Payer: Self-pay | Admitting: Family Medicine

## 2018-03-05 ENCOUNTER — Ambulatory Visit (INDEPENDENT_AMBULATORY_CARE_PROVIDER_SITE_OTHER): Payer: 59 | Admitting: Family Medicine

## 2018-03-05 VITALS — BP 114/76 | HR 77 | Temp 97.6°F | Ht 66.5 in | Wt 247.0 lb

## 2018-03-05 DIAGNOSIS — N3090 Cystitis, unspecified without hematuria: Secondary | ICD-10-CM | POA: Diagnosis not present

## 2018-03-05 DIAGNOSIS — R300 Dysuria: Secondary | ICD-10-CM

## 2018-03-05 LAB — POC URINALSYSI DIPSTICK (AUTOMATED)
Bilirubin, UA: NEGATIVE
Glucose, UA: NEGATIVE
KETONES UA: NEGATIVE
LEUKOCYTES UA: NEGATIVE
Nitrite, UA: NEGATIVE
PH UA: 6.5 (ref 5.0–8.0)
PROTEIN UA: NEGATIVE
RBC UA: NEGATIVE
Spec Grav, UA: <=1.005 — AB (ref 1.010–1.025)
Urobilinogen, UA: 0.2 E.U./dL

## 2018-03-05 MED ORDER — SULFAMETHOXAZOLE-TRIMETHOPRIM 800-160 MG PO TABS: 1.0000 | ORAL_TABLET | Freq: Two times a day (BID) | ORAL | 0 refills | Status: DC

## 2018-03-05 NOTE — Patient Instructions (Addendum)
Good to see you today  If fever over 101, worsening pain, vomiting that won't stop- go to ER  For pain- tylenol, advil (if your stomach can tolerate)    Urinary Tract Infection, Adult A urinary tract infection (UTI) is an infection of any part of the urinary tract. The urinary tract includes the:  Kidneys.  Ureters.  Bladder.  Urethra.  These organs make, store, and get rid of pee (urine) in the body. Follow these instructions at home:  Take over-the-counter and prescription medicines only as told by your doctor.  If you were prescribed an antibiotic medicine, take it as told by your doctor. Do not stop taking the antibiotic even if you start to feel better.  Avoid the following drinks: ? Alcohol. ? Caffeine. ? Tea. ? Carbonated drinks.  Drink enough fluid to keep your pee clear or pale yellow.  Keep all follow-up visits as told by your doctor. This is important.  Make sure to: ? Empty your bladder often and completely. Do not to hold pee for long periods of time. ? Empty your bladder before and after sex. ? Wipe from front to back after a bowel movement if you are female. Use each tissue one time when you wipe. Contact a doctor if:  You have back pain.  You have a fever.  You feel sick to your stomach (nauseous).  You throw up (vomit).  Your symptoms do not get better after 3 days.  Your symptoms go away and then come back. Get help right away if:  You have very bad back pain.  You have very bad lower belly (abdominal) pain.  You are throwing up and cannot keep down any medicines or water. This information is not intended to replace advice given to you by your health care provider. Make sure you discuss any questions you have with your health care provider. Document Released: 02/11/2008 Document Revised: 01/31/2016 Document Reviewed: 07/16/2015 Elsevier Interactive Patient Education  [employer].

## 2018-03-05 NOTE — Progress Notes (Signed)
Subjective:    Patient ID: Yesenia Beasley, female    DOB: 1986-05-23, 32 y.o.   MRN: [id number]  HPI This is a 32 yo female who presents today with flank pain and dysuria. Urinary frequency, symptoms started yesterday. She did a home test which was positive for leuk/nit. Took AZO. Pain has since shifted to left flank. Some decrease in lower abdominal pressure. No temperature. Mild nausea, no vomiting. Has not taken anything for pain. Started cranberry pills. Has never had UTI before. Recently did not void after intercourse.    Past Medical History:  Diagnosis Date  . Anxiety   . Genetic testing 11/09/2017   Multi-Cancer panel (83 genes) @ [facility] - No pathogenic mutations detected   Past Surgical History:  Procedure Laterality Date  . COLONOSCOPY WITH ESOPHAGOGASTRODUODENOSCOPY (EGD)    . COLONOSCOPY WITH PROPOFOL N/A 10/05/2017   Procedure: COLONOSCOPY WITH PROPOFOL;  Surgeon: Jonathon Bellows, MD;  Location: [facility] ENDOSCOPY;  Service: Gastroenterology;  Laterality: N/A;  . KNEE SURGERY Bilateral 2002,2003,2014   Family History  Problem Relation Age of Onset  . Leukemia Mother        CMML; dx 69s; deceased 34  . Heart disease Father   . Colon polyps Father        initially in 14s; #/type unknown  . Wilm's tumor Sister        deceased at 2  . Brain cancer Maternal Grandfather        astrocytoma; deceased 76  . Heart attack Paternal Grandfather        deceased at 41  . Thyroid cancer Maternal Uncle        dx 94s  . Non-Hodgkin's lymphoma Maternal Aunt    Social History   Tobacco Use  . Smoking status: Current Every Day Smoker  . Smokeless tobacco: Never Used  Substance Use Topics  . Alcohol use: Yes    Comment: pcc  . Drug use: No      Review of Systems     Objective:   Physical Exam Physical Exam  Constitutional: She is oriented to person, place, and time. She appears well-developed and well-nourished. No distress.  HENT:  Head: Normocephalic and atraumatic.    Cardiovascular: Normal rate, regular rhythm and normal heart sounds.   Pulmonary/Chest: Effort normal and breath sounds normal.  Abdominal: Soft. She exhibits no distension. There is no tenderness. There is no rebound, no guarding and no CVA tenderness.  Neurological: She is alert and oriented to person, place, and time.  Skin: Skin is warm and dry. She is not diaphoretic.  Psychiatric: She has a normal mood and affect. Her behavior is normal. Judgment and thought content normal.  Vitals reviewed.   BP 114/76 (BP Location: Right Arm, Patient Position: Sitting, Cuff Size: Large)   Pulse 77   Temp 97.6 F (36.4 C) (Oral)   Ht 5' 6.5" (1.689 m)   Wt 247 lb (112 kg)   LMP 02/26/2018   SpO2 97%   BMI 39.27 kg/m  Results for orders placed or performed in visit on 03/05/18  POCT Urinalysis Dipstick (Automated)  Result Value Ref Range   Color, UA yellow    Clarity, UA clear    Glucose, UA Negative Negative   Bilirubin, UA neg    Ketones, UA neg    Spec Grav, UA <=1.005 (A) 1.010 - 1.025   Blood, UA neg    pH, UA 6.5 5.0 - 8.0   Protein, UA Negative Negative   Urobilinogen,  UA 0.2 0.2 or 1.0 E.U./dL   Nitrite, UA neg    Leukocytes, UA Negative Negative       Assessment & Plan:  1. Dysuria - POCT Urinalysis Dipstick (Automated)  2. Cystitis - will start treatment based on home tests and symptoms - RTC/ER precautions reviewed - sulfamethoxazole-trimethoprim (BACTRIM DS,SEPTRA DS) 800-160 MG tablet; Take 1 tablet by mouth 2 (two) times daily.  Dispense: 10 tablet; Refill: 0 - Urine Culture   Clarene Reamer, FNP-BC  [facility], [facility]  03/05/2018 11:13 AM

## 2018-03-07 LAB — URINE CULTURE
MICRO NUMBER:: 90774924
SPECIMEN QUALITY:: ADEQUATE

## 2018-03-08 ENCOUNTER — Encounter: Payer: Self-pay | Admitting: Gastroenterology

## 2018-03-08 ENCOUNTER — Ambulatory Visit (INDEPENDENT_AMBULATORY_CARE_PROVIDER_SITE_OTHER): Payer: 59 | Admitting: Gastroenterology

## 2018-03-08 ENCOUNTER — Encounter: Payer: Self-pay | Admitting: Family Medicine

## 2018-03-08 VITALS — BP 124/82 | HR 101 | Temp 98.0°F | Ht 66.0 in | Wt 245.4 lb

## 2018-03-08 DIAGNOSIS — K219 Gastro-esophageal reflux disease without esophagitis: Secondary | ICD-10-CM | POA: Diagnosis not present

## 2018-03-08 NOTE — Patient Instructions (Signed)
F/U 6 MONTHS  

## 2018-03-08 NOTE — Progress Notes (Signed)
Jonathon Bellows MD, MRCP(U.K) [address]  [place]  [place], [place] [address]  Main: [phone]  Fax: [phone]   Primary Care Physician: Elby Beck, FNP  Primary Gastroenterologist:  Dr. Jonathon Bellows   Chief Complaint  Patient presents with  . Follow-up    reflux    HPI: Yesenia Beasley is a 32 y.o. female   Summary of history :   She is here today to follow up for GERD. She underwent a colonoscopy by me in 09/2017 due to personal history of colon polyps. I found one inflammatory polyp. Subsequent Genetic testing did not show any abnormalities for the cause of polyps at such a young age.  She has had reflux since 11/2017 , described it as a fire in her chest .She started taking prilosec, zantac, changed her diet , stopped coffee and says it took a few days to work and was feeling better. Stopped after 2 weeks of PPI and had another attack.   Interval history   01/05/2018-  7/1//2019  She presented to the ER on 02/26/18 for chest pain on the left side . Wheezing noted bilaterally , given nebs and wheezing resolved.Treated for musculoskeletal chest pain.   Taking Prilosec in the am and Zantac at night .  Lost 5 lbs since last visit. Changed her diet . Thinks it has helped.Overall the medications have helped.    Current Outpatient Medications  Medication Sig Dispense Refill  . ALPRAZolam (XANAX) 0.5 MG tablet Take 1-2 tablets daily as needed for anxiety and sleep 45 tablet 2  . Clindamycin Phosphate foam Apply ONCE daily TO affected AREA OF inner THIGHS  3  . PROAIR HFA 108 (90 Base) MCG/ACT inhaler TAKE 2 PUFFS BY MOUTH EVERY 6 HOURS AS NEEDED FOR WHEEZE OR SHORTNESS OF BREATH 8.5 Inhaler 2  . sertraline (ZOLOFT) 100 MG tablet Take 1 tablet (100 mg total) by mouth daily. 90 tablet 3  . sulfamethoxazole-trimethoprim (BACTRIM DS,SEPTRA DS) 800-160 MG tablet Take 1 tablet by mouth 2 (two) times daily. 10 tablet 0  . methocarbamol (ROBAXIN) 500 MG tablet Take 1 tablet  (500 mg total) by mouth 2 (two) times daily. (Patient not taking: Reported on 03/05/2018) 20 tablet 0  . omeprazole (PRILOSEC) 40 MG capsule Take 1 capsule (40 mg total) by mouth daily. 90 capsule 3   No current facility-administered medications for this visit.     Allergies as of 03/08/2018  . (No Known Allergies)    ROS:  General: Negative for anorexia, weight loss, fever, chills, fatigue, weakness. ENT: Negative for hoarseness, difficulty swallowing , nasal congestion. CV: Negative for chest pain, angina, palpitations, dyspnea on exertion, peripheral edema.  Respiratory: Negative for dyspnea at rest, dyspnea on exertion, cough, sputum, wheezing.  GI: See history of present illness. GU:  Negative for dysuria, hematuria, urinary incontinence, urinary frequency, nocturnal urination.  Endo: Negative for unusual weight change.    Physical Examination:   BP 124/82   Pulse (!) 101   Temp 98 F (36.7 C) (Oral)   Ht 5\' 6"  (1.676 m)   Wt 245 lb 6.4 oz (111.3 kg)   LMP 02/26/2018   BMI 39.61 kg/m   General: Well-nourished, well-developed in no acute distress.  Eyes: No icterus. Conjunctivae pink. Mouth: Oropharyngeal mucosa moist and pink , no lesions erythema or exudate. Lungs: Clear to auscultation bilaterally. Non-labored. Heart: Regular rate and rhythm, no murmurs rubs or gallops.  Abdomen: Bowel sounds are normal, nontender, nondistended, no hepatosplenomegaly or masses,  no abdominal bruits or hernia , no rebound or guarding.   Extremities: No lower extremity edema. No clubbing or deformities. Neuro: Alert and oriented x 3.  Grossly intact. Skin: Warm and dry, no jaundice.   Psych: Alert and cooperative, normal mood and affect.   Imaging Studies: Dg Chest 2 View  Result Date: 02/26/2018 CLINICAL DATA:  Chest pain. EXAM: CHEST - 2 VIEW COMPARISON:  No prior. FINDINGS: Mediastinum and hilar structures normal. Heart size normal. Mild atelectatic changes noted anteriorly over  the lungs on lateral view. Atelectatic changes may be in the left perihilar region. No focal alveolar infiltrate. No pleural effusion or pneumothorax. IMPRESSION: Mild atelectatic changes noted anteriorly over the lungs on lateral view. Atelectatic changes may be in the left perihilar region. No focal alveolar infiltrate. Heart size normal. Electronically Signed   By: Marcello Moores  Register   On: 02/26/2018 11:32    Assessment and Plan:   Yesenia Beasley is a 32 y.o. y/o female to follow up for GERD . Doing well. Lost weight . Compliant to life style changes. At next visit will aim to decrease dose of Prilosec and keep on Zantac.     Dr Jonathon Bellows  MD,MRCP [facility]) Follow up in 6 months

## 2018-03-10 ENCOUNTER — Ambulatory Visit: Payer: 59 | Admitting: Family Medicine

## 2018-03-17 ENCOUNTER — Ambulatory Visit: Payer: 59 | Admitting: Family Medicine

## 2018-03-23 ENCOUNTER — Encounter: Payer: Self-pay | Admitting: Family Medicine

## 2018-03-23 ENCOUNTER — Ambulatory Visit (HOSPITAL_COMMUNITY)
Admission: EM | Admit: 2018-03-23 | Discharge: 2018-03-23 | Disposition: A | Discharge status: Home / Self Care | Payer: 59 | Attending: Family Medicine | Admitting: Family Medicine

## 2018-03-23 ENCOUNTER — Other Ambulatory Visit: Payer: Self-pay

## 2018-03-23 ENCOUNTER — Encounter (HOSPITAL_COMMUNITY): Payer: Self-pay | Admitting: Emergency Medicine

## 2018-03-23 ENCOUNTER — Ambulatory Visit (INDEPENDENT_AMBULATORY_CARE_PROVIDER_SITE_OTHER): Payer: 59

## 2018-03-23 DIAGNOSIS — R509 Fever, unspecified: Secondary | ICD-10-CM

## 2018-03-23 DIAGNOSIS — R5383 Other fatigue: Secondary | ICD-10-CM | POA: Diagnosis not present

## 2018-03-23 DIAGNOSIS — R062 Wheezing: Secondary | ICD-10-CM | POA: Diagnosis not present

## 2018-03-23 DIAGNOSIS — M791 Myalgia, unspecified site: Secondary | ICD-10-CM

## 2018-03-23 DIAGNOSIS — Z3202 Encounter for pregnancy test, result negative: Secondary | ICD-10-CM

## 2018-03-23 LAB — POCT URINALYSIS DIP (DEVICE)
Bilirubin Urine: NEGATIVE
Glucose, UA: NEGATIVE mg/dL
Ketones, ur: NEGATIVE mg/dL
LEUKOCYTES UA: NEGATIVE
NITRITE: NEGATIVE
PROTEIN: NEGATIVE mg/dL
Specific Gravity, Urine: 1.025 (ref 1.005–1.030)
UROBILINOGEN UA: 0.2 mg/dL (ref 0.0–1.0)
pH: 6 (ref 5.0–8.0)

## 2018-03-23 LAB — POCT PREGNANCY, URINE: Preg Test, Ur: NEGATIVE

## 2018-03-23 IMAGING — DX DG CHEST 2V
2 series · 2 of 2 positions shown · non-contrast
Comparison: Prior chest x-ray [DATE]

CLINICAL DATA: 31-year-old female with persistent fatigue, fever,
cough and joint aches. Recently treated for UTI.

EXAM:
CHEST - 2 VIEW

[chest pa]
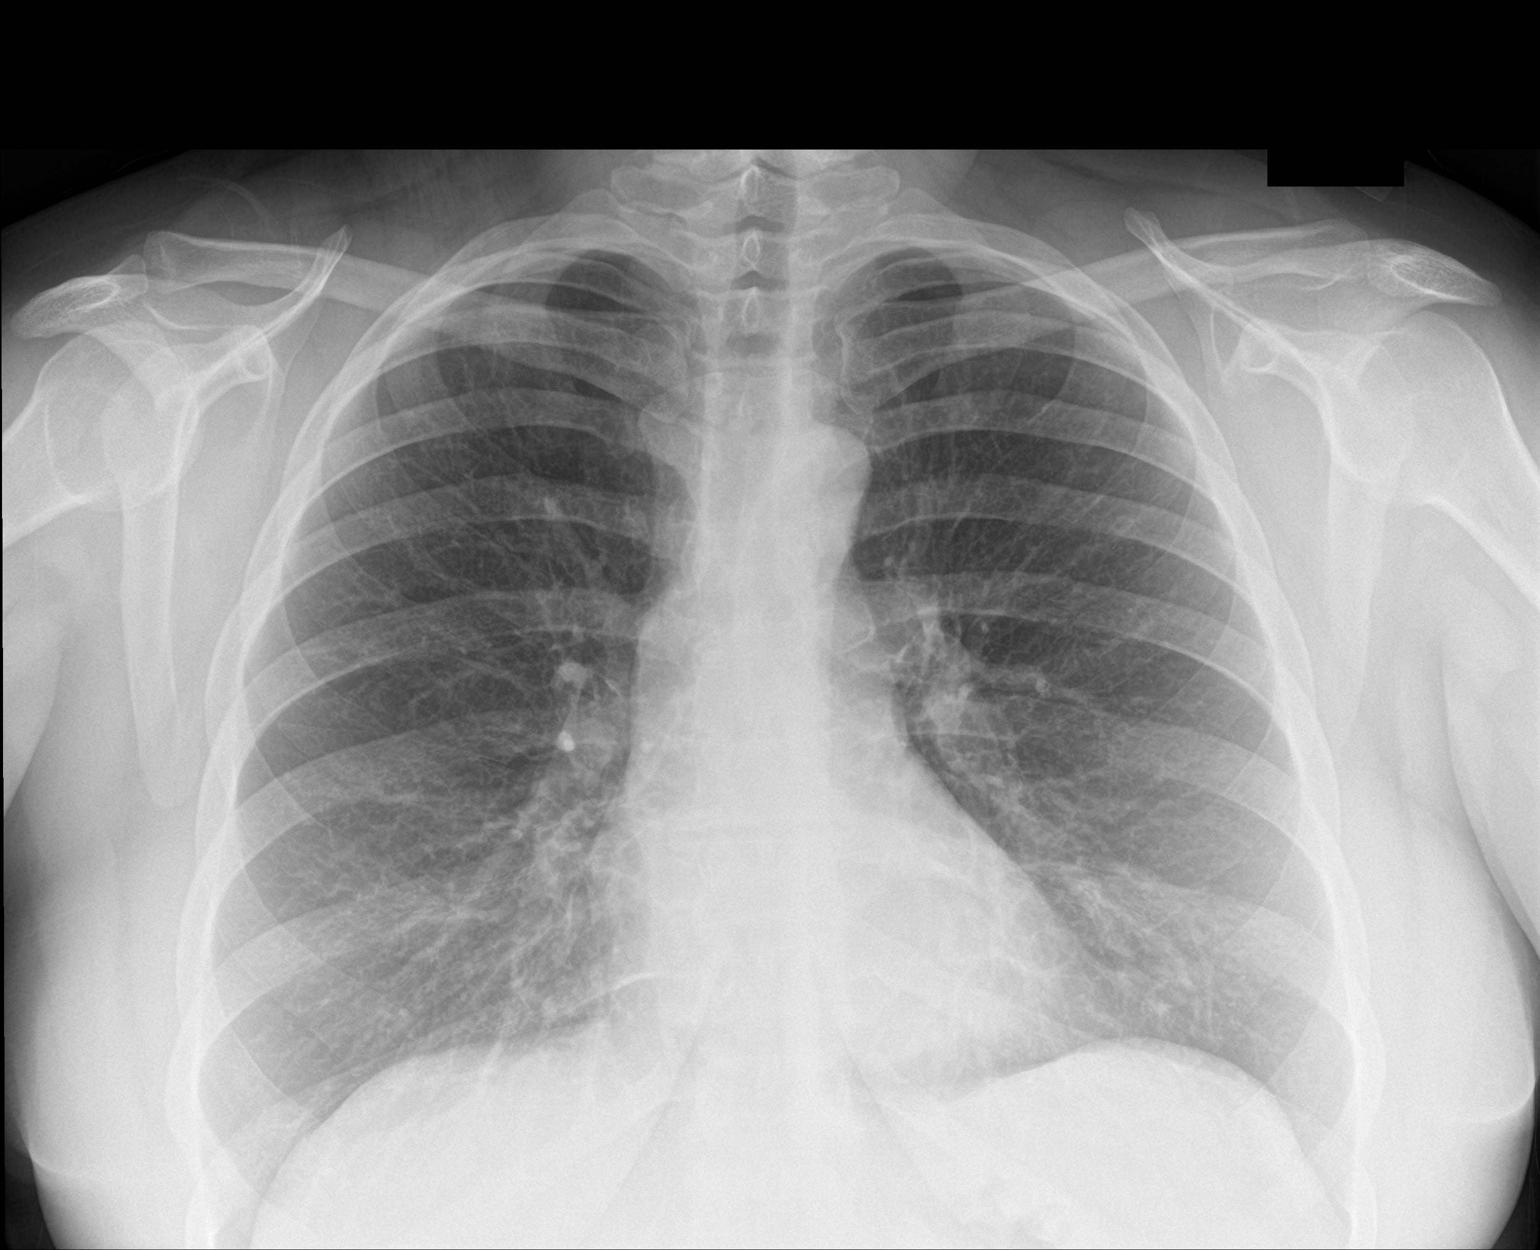

[chest lat]
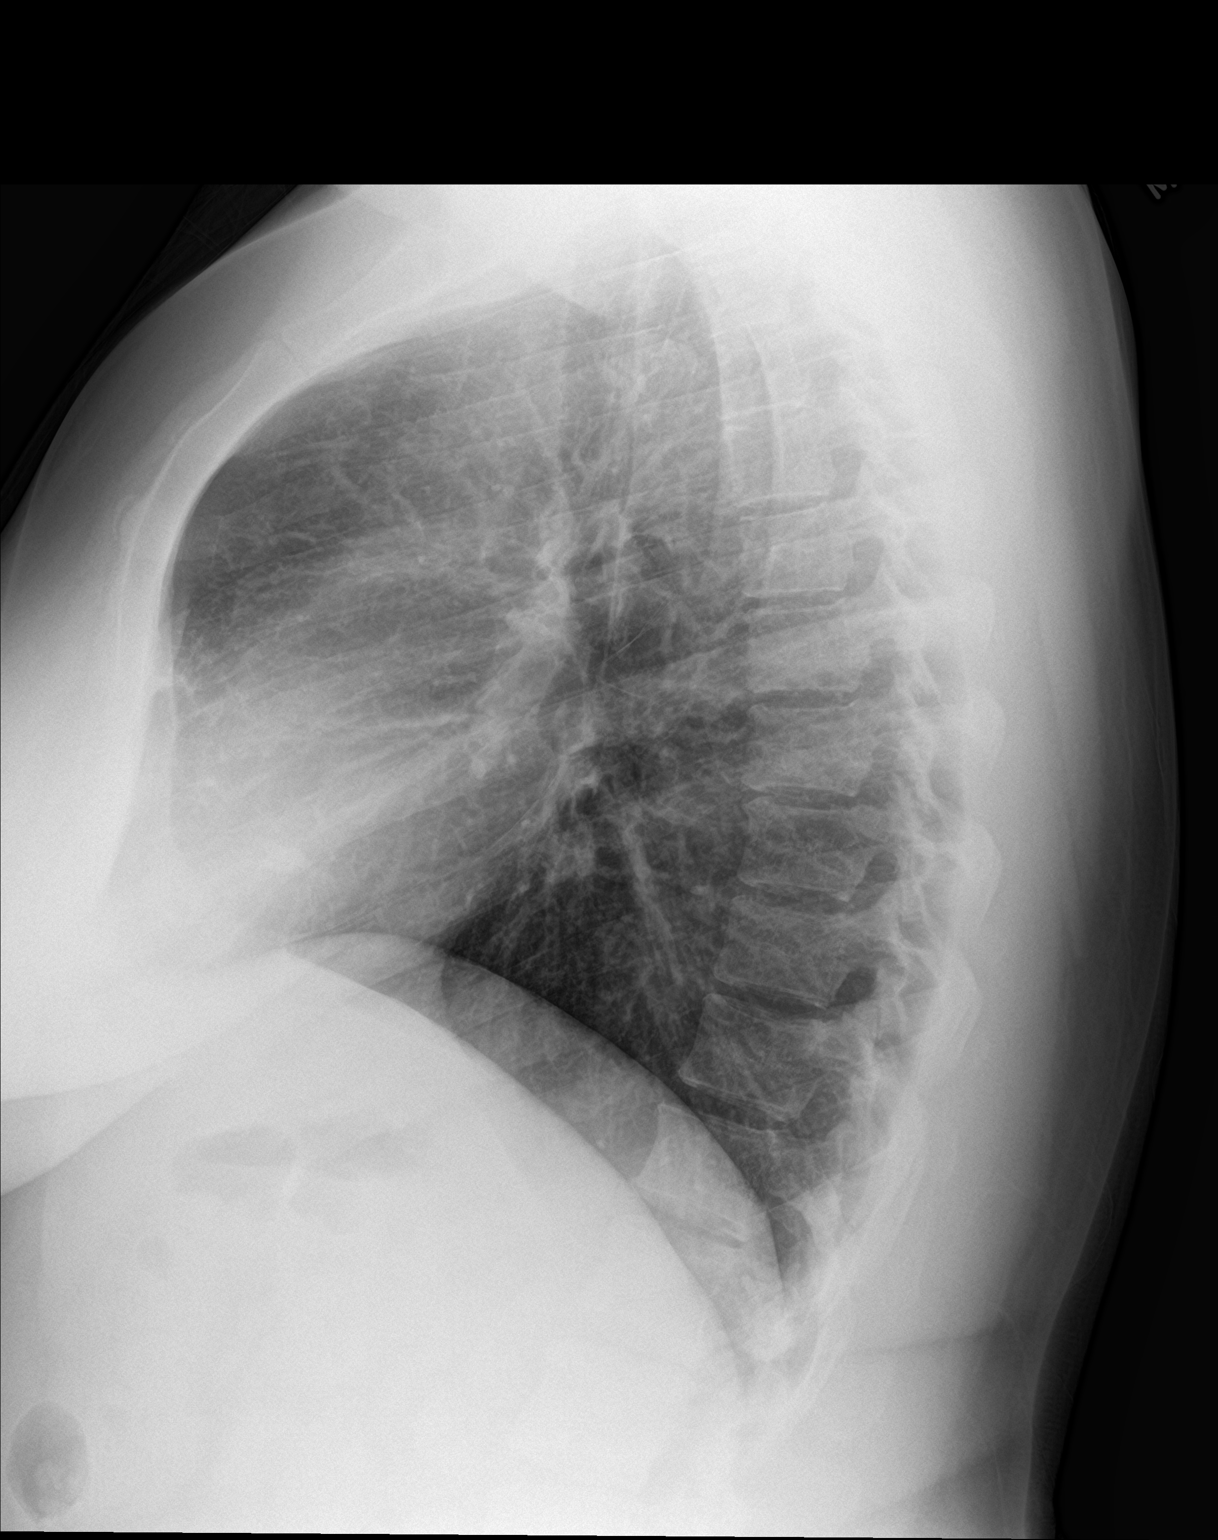

[2 of 2 positions shown; findings below may reference images not displayed]

FINDINGS: Interval development of patchy airspace opacities in the right lower
lobe on the lateral view which were not present on the recent prior
chest x-ray. The lungs are otherwise clear. The cardiac and
mediastinal contours are normal. No acute osseous abnormality.
Visualized upper abdominal bowel gas pattern is normal.
IMPRESSION: New patchy airspace opacities in the posterior inferior right lower
lobe compared to [DATE]. Imaging findings are concerning for
early bronchopneumonia.

## 2018-03-23 MED ORDER — IPRATROPIUM-ALBUTEROL 0.5-2.5 (3) MG/3ML IN SOLN
3.0000 mL | Freq: Once | RESPIRATORY_TRACT | Status: AC
  Administered 2018-03-23: 3 mL via RESPIRATORY_TRACT

## 2018-03-23 MED ORDER — IPRATROPIUM-ALBUTEROL 0.5-2.5 (3) MG/3ML IN SOLN
RESPIRATORY_TRACT | Status: AC
  Filled 2018-03-23: qty 3

## 2018-03-23 NOTE — Discharge Instructions (Addendum)
It was nice meeting you!!  Your chest x-ray was normal. Your lung sounds improved after the breathing treatment. Your urine was clear no infection. This could be a viral illness.  I will have you follow-up with your primary care on Friday as scheduled.  They may want to do further work-up at that time.

## 2018-03-23 NOTE — ED Provider Notes (Signed)
[place]    CSN: [id number] Arrival date & time: 03/23/18  1053     History   Chief Complaint Chief Complaint  Patient presents with  . Fever  . Fatigue    HPI Yesenia Beasley is a 32 y.o. female.   Patient is a 32 year old female that presents today with 2 weeks of low-grade fever, fatigue, body aches, diaphoresis.  She was treated for a urinary tract infection on 03/05/2018 and reports her urinary symptoms have resolved but she is still not feeling well.  She has had a mild cough with phlegm production.  She is a smoker.  Highest fever reported was 100.    She denies any sore throat, ear pain, runny nose, nasal congestion, facial pain.  She denies any chest pain, shortness of breath or palpatations.  She denies any back pain, abdominal pain, pelvic pain, dysuria, hematuria, urinary frequency. She denies any tick bites, recent travels or sick contacts.  She took a pregnancy test yesterday at home with negative results and started her menstrual period today.  She took ibuprofen last night for her fever.  She has also had a work-up 1 week prior on 02/26/2018 in the ER for chest pain.  No concern for PE, ACS or pulmonary etiology at that time. She was given a breathing treatment.   She states that she felt the same way when she had pneumonia in college.       Past Medical History:  Diagnosis Date  . Anxiety   . Genetic testing 11/09/2017   Multi-Cancer panel (83 genes) @ [facility] - No pathogenic mutations detected    Patient Active Problem List   Diagnosis Date Noted  . Gastroesophageal reflux disease 01/06/2018  . Genetic testing 11/09/2017  . Dysmenorrhea 06/25/2017  . Panic disorder 06/25/2017  . Pseudotumor cerebri 06/25/2017  . History of colon polyps 06/25/2017  . Depression, recurrent ([facility]) 06/25/2017  . Anxiety 06/15/2017    Past Surgical History:  Procedure Laterality Date  . COLONOSCOPY WITH ESOPHAGOGASTRODUODENOSCOPY (EGD)    . COLONOSCOPY WITH  PROPOFOL N/A 10/05/2017   Procedure: COLONOSCOPY WITH PROPOFOL;  Surgeon: Jonathon Bellows, MD;  Location: [facility] ENDOSCOPY;  Service: Gastroenterology;  Laterality: N/A;  . KNEE SURGERY Bilateral 2002,2003,2014    OB History   None      Home Medications    Prior to Admission medications   Medication Sig Start Date End Date Taking? Authorizing Provider  ALPRAZolam Duanne Moron) 0.5 MG tablet Take 1-2 tablets daily as needed for anxiety and sleep 01/06/18  Yes Elby Beck, FNP  cyclobenzaprine (FLEXERIL) 5 MG tablet Take 5 mg by mouth 3 (three) times daily as needed for muscle spasms.   Yes [provider]  omeprazole (PRILOSEC) 40 MG capsule Take 1 capsule (40 mg total) by mouth daily. 01/05/18 03/23/18 Yes Jonathon Bellows, MD  PROAIR HFA 108 [phone] Base) MCG/ACT inhaler TAKE 2 PUFFS BY MOUTH EVERY 6 HOURS AS NEEDED FOR WHEEZE OR SHORTNESS OF BREATH 02/05/18  Yes Elby Beck, FNP  sertraline (ZOLOFT) 100 MG tablet Take 1 tablet (100 mg total) by mouth daily. 01/06/18  Yes Elby Beck, FNP  Clindamycin Phosphate foam Apply ONCE daily TO affected AREA OF inner THIGHS 08/12/17   [provider]  methocarbamol (ROBAXIN) 500 MG tablet Take 1 tablet (500 mg total) by mouth 2 (two) times daily. Patient not taking: Reported on 03/05/2018 02/26/18   McDonald, Mia A, PA-C  sulfamethoxazole-trimethoprim (BACTRIM DS,SEPTRA DS) 800-160 MG tablet Take 1  tablet by mouth 2 (two) times daily. 03/05/18   Elby Beck, FNP    Family History Family History  Problem Relation Age of Onset  . Leukemia Mother        CMML; dx 82s; deceased 66  . Heart disease Father   . Colon polyps Father        initially in 52s; #/type unknown  . Wilm's tumor Sister        deceased at 2  . Brain cancer Maternal Grandfather        astrocytoma; deceased 8  . Heart attack Paternal Grandfather        deceased at 77  . Thyroid cancer Maternal Uncle        dx 73s  . Non-Hodgkin's lymphoma Maternal Aunt      Social History Social History   Tobacco Use  . Smoking status: Current Every Day Smoker  . Smokeless tobacco: Never Used  Substance Use Topics  . 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 Drug use: No     Allergies   Patient has no known allergies.   Review of Systems Review of Systems   Physical Exam Triage Vital Signs ED Triage Vitals  Enc Vitals Group     BP 03/23/18 1113 (!) 154/97     Pulse Rate 03/23/18 1113 94     Resp 03/23/18 1113 16     Temp 03/23/18 1113 98.2 F (36.8 C)     Temp Source 03/23/18 1113 Oral     SpO2 03/23/18 1113 97 %     Weight --      Height --      Head Circumference --      Peak Flow --      Pain Score 03/23/18 1115 0     Pain Loc --      Pain Edu? --      Excl. in [place]? --    No data found.  Updated Vital Signs BP (!) 154/97 (BP Location: Left Arm)   Pulse 94   Temp 98.2 F (36.8 C) (Oral)   Resp 16   LMP 03/23/2018 (Exact Date)   SpO2 97%   Visual Acuity Right Eye Distance:   Left Eye Distance:   Bilateral Distance:    Right Eye Near:   Left Eye Near:    Bilateral Near:     Physical Exam  Constitutional: She is oriented to person, place, and time. She appears well-developed and well-nourished.  HENT:  Head: Normocephalic and atraumatic.  Neck: Normal range of motion.  Cardiovascular: Normal rate, regular rhythm and normal heart sounds.  Pulmonary/Chest: Effort normal.  Bilateral inspiratory and expiratory wheezing.   Musculoskeletal: Normal range of motion.  Neurological: She is alert and oriented to person, place, and time.  Skin: Skin is warm and dry. Capillary refill takes less than 2 seconds. No rash noted. No erythema. No pallor.  Psychiatric: She has a normal mood and affect.     UC Treatments / Results  Labs (all labs ordered are listed, but only abnormal results are displayed) Labs Reviewed  POCT URINALYSIS DIP (DEVICE) - Abnormal; Notable for the following components:      Result Value    Hgb urine dipstick MODERATE (*)    All other components within normal limits  POCT PREGNANCY, URINE    EKG None  Radiology Dg Chest 2 View  Result Date: 03/23/2018 CLINICAL DATA:  32 year old female with persistent fatigue, fever, cough and  joint aches. Recently treated for UTI. EXAM: CHEST - 2 VIEW COMPARISON:  Prior chest x-ray 02/26/2018 FINDINGS: Interval development of patchy airspace opacities in the right lower lobe on the lateral view which were not present on the recent prior chest x-ray. The lungs are otherwise clear. The cardiac and mediastinal contours are normal. No acute osseous abnormality. Visualized upper abdominal bowel gas pattern is normal. IMPRESSION: New patchy airspace opacities in the posterior inferior right lower lobe compared to 02/26/2018. Imaging findings are concerning for early bronchopneumonia. Electronically Signed   By: Jacqulynn Cadet M.D.   On: 03/23/2018 12:12    Procedures Procedures (including critical care time)  Medications Ordered in UC Medications  ipratropium-albuterol (DUONEB) 0.5-2.5 (3) MG/3ML nebulizer solution 3 mL (3 mLs Nebulization Given 03/23/18 1210)    Initial Impression / Assessment and Plan / UC Course  I have reviewed the triage vital signs and the nursing notes.  Pertinent labs & imaging results that were available during my care of the patient were reviewed by me and considered in my medical decision making (see chart for details).     Based on history and clinical presentation will check chest x-ray to rule out pneumonia.  X-ray negative for pneumonia, bronchitis.  Showed mild atelectasis.  This could be due to patient's history of smoking.  DuoNeb given for wheezing.  Lungs clear after DuoNeb.  Urine was negative for infection.  Moderate hemoglobin likely from her menstrual starting this a.m.  No concern for kidney stone, pyelonephritis.   No concern for mono, strep throat, Lyme's disease.   No concern today or further  testing warranted.  Patient has an appointment on Friday with her primary care provider.  Patient agreeable with plan.  Return precautions given     Final Clinical Impressions(s) / UC Diagnoses   Final diagnoses:  Fatigue, unspecified type     Discharge Instructions     It was nice meeting you!!  Your chest x-ray was normal. Your lung sounds improved after the breathing treatment. Your urine was clear no infection. This could be a viral illness.  I will have you follow-up with your primary care on Friday as scheduled.  They may want to do further work-up at that time.     ED Prescriptions    None     Controlled Substance Prescriptions [place] Controlled Substance Registry consulted? Not Applicable   Orvan July, NP 03/23/18 1754

## 2018-03-23 NOTE — ED Triage Notes (Signed)
Pt diagnosed with UTI a few weeks ago and completed course of abx.  Since that time she has been feeling fatigued, has had a temp of 99.9, and she has been clammy.  Pt is concerned about her BP today as well.

## 2018-03-24 ENCOUNTER — Telehealth (HOSPITAL_COMMUNITY): Payer: Self-pay | Admitting: Family Medicine

## 2018-03-24 ENCOUNTER — Other Ambulatory Visit: Payer: Self-pay | Admitting: Family Medicine

## 2018-03-24 MED ORDER — AZITHROMYCIN 250 MG PO TABS: 250.0000 mg | ORAL_TABLET | Freq: Every day | ORAL | 0 refills | Status: DC

## 2018-03-24 MED ORDER — FLUCONAZOLE 150 MG PO TABS: 150.0000 mg | ORAL_TABLET | Freq: Once | ORAL | 0 refills | Status: AC

## 2018-03-24 NOTE — Telephone Encounter (Signed)
Spoke with pt over the phone and she is not feeling better. Called in [place] to the pharmacy. She will continue to follow up with PCP Friday.

## 2018-03-26 ENCOUNTER — Encounter

## 2018-03-26 ENCOUNTER — Encounter: Payer: Self-pay | Admitting: Family Medicine

## 2018-03-26 ENCOUNTER — Ambulatory Visit (INDEPENDENT_AMBULATORY_CARE_PROVIDER_SITE_OTHER): Payer: 59 | Admitting: Family Medicine

## 2018-03-26 VITALS — BP 120/78 | HR 77 | Temp 98.0°F | Ht 66.0 in | Wt 245.0 lb

## 2018-03-26 DIAGNOSIS — R5383 Other fatigue: Secondary | ICD-10-CM | POA: Diagnosis not present

## 2018-03-26 LAB — CBC WITH DIFFERENTIAL/PLATELET
BASOS ABS: 0.1 10*3/uL (ref 0.0–0.1)
Basophils Relative: 0.8 % (ref 0.0–3.0)
EOS PCT: 2.9 % (ref 0.0–5.0)
Eosinophils Absolute: 0.3 10*3/uL (ref 0.0–0.7)
HEMATOCRIT: 42.9 % (ref 36.0–46.0)
Hemoglobin: 14.5 g/dL (ref 12.0–15.0)
Lymphocytes Relative: 22.1 % (ref 12.0–46.0)
Lymphs Abs: 2 10*3/uL (ref 0.7–4.0)
MCHC: 33.7 g/dL (ref 30.0–36.0)
MCV: 87.3 fl (ref 78.0–100.0)
MONOS PCT: 4.6 % (ref 3.0–12.0)
Monocytes Absolute: 0.4 10*3/uL (ref 0.1–1.0)
NEUTROS ABS: 6.4 10*3/uL (ref 1.4–7.7)
Neutrophils Relative %: 69.6 % (ref 43.0–77.0)
Platelets: 187 10*3/uL (ref 150.0–400.0)
RBC: 4.92 Mil/uL (ref 3.87–5.11)
RDW: 13.7 % (ref 11.5–15.5)
WBC: 9.2 10*3/uL (ref 4.0–10.5)

## 2018-03-26 LAB — COMPREHENSIVE METABOLIC PANEL
ALT: 18 U/L (ref 0–35)
AST: 11 U/L (ref 0–37)
Albumin: 4.2 g/dL (ref 3.5–5.2)
Alkaline Phosphatase: 74 U/L (ref 39–117)
BILIRUBIN TOTAL: 0.4 mg/dL (ref 0.2–1.2)
BUN: 10 mg/dL (ref 6–23)
CALCIUM: 9.3 mg/dL (ref 8.4–10.5)
CO2: 28 mEq/L (ref 19–32)
Chloride: 106 mEq/L (ref 96–112)
Creatinine, Ser: 0.87 mg/dL (ref 0.40–1.20)
GFR: 79.25 mL/min (ref >60.00)
Glucose, Bld: 100 mg/dL — ABNORMAL HIGH (ref 70–99)
Potassium: 4 mEq/L (ref 3.5–5.1)
Sodium: 139 mEq/L (ref 135–145)
Total Protein: 6.9 g/dL (ref 6.0–8.3)

## 2018-03-26 LAB — TSH: TSH: 1.48 u[IU]/mL (ref 0.35–4.50)

## 2018-03-26 NOTE — Progress Notes (Signed)
   Subjective:    Patient ID: Yesenia Beasley, female    DOB: 1986-06-06, 32 y.o.   MRN: [id number]  HPI This is a 32 yo female who presents today with fatigue with recent illnesses. Has had UTI and URI. Currently on day 3 of Zpack. Had fever last night to 101. Feels better today. No cough or SOB. Family history of leukemia and lymphoma so is concerned about her fatigue although she understands likely related to recent illness.   Past Medical History:  Diagnosis Date  . Anxiety   . Genetic testing 11/09/2017   Multi-Cancer panel (83 genes) @ [facility] - No pathogenic mutations detected   Past Surgical History:  Procedure Laterality Date  . COLONOSCOPY WITH ESOPHAGOGASTRODUODENOSCOPY (EGD)    . COLONOSCOPY WITH PROPOFOL N/A 10/05/2017   Procedure: COLONOSCOPY WITH PROPOFOL;  Surgeon: Jonathon Bellows, MD;  Location: [facility] ENDOSCOPY;  Service: Gastroenterology;  Laterality: N/A;  . KNEE SURGERY Bilateral 2002,2003,2014   Family History  Problem Relation Age of Onset  . Leukemia Mother        CMML; dx 94s; deceased 25  . Heart disease Father   . Colon polyps Father        initially in 27s; #/type unknown  . Wilm's tumor Sister        deceased at 2  . Brain cancer Maternal Grandfather        astrocytoma; deceased 62  . Heart attack Paternal Grandfather        deceased at 37  . Thyroid cancer Maternal Uncle        dx 81s  . Non-Hodgkin's lymphoma Maternal Aunt    Social History   Tobacco Use  . Smoking status: Current Every Day Smoker  . Smokeless tobacco: Never Used  Substance Use Topics  . 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 Drug use: No      Review of Systems Pr HPI    Objective:   Physical Exam Physical Exam  Vitals reviewed. Constitutional: Oriented to person, place, and time. Appears well-developed and well-nourished.  HENT:  Head: Normocephalic and atraumatic.  Eyes: Conjunctivae are normal.  Neck: Normal range of motion. Neck supple.  Cardiovascular: Normal  rate.   Pulmonary/Chest: Effort normal.  Musculoskeletal: Normal range of motion.  Neurological: Alert and oriented to person, place, and time.  Skin: Skin is warm and dry.  Psychiatric: Normal mood and affect. Behavior is normal. Judgment and thought content normal.      BP 120/78 (BP Location: Right Arm, Patient Position: Sitting, Cuff Size: Large)   Pulse 77   Temp 98 F (36.7 C) (Oral)   Ht 5\' 6"  (1.676 m)   Wt 245 lb (111.1 kg)   LMP 03/23/2018 (Exact Date)   SpO2 96%   BMI 39.54 kg/m  Wt Readings from Last 3 Encounters:  03/26/18 245 lb (111.1 kg)  03/08/18 245 lb 6.4 oz (111.3 kg)  03/05/18 247 lb (112 kg)       Assessment & Plan:  1. Other fatigue - continue good hydration, rest - TSH - CBC with Differential - Comprehensive metabolic panel   Clarene Reamer, FNP-BC  [facility]  03/26/2018 11:26 AM

## 2018-03-26 NOTE — Patient Instructions (Signed)
Good to see you today ° °I will notify you of labs °

## 2018-04-30 ENCOUNTER — Other Ambulatory Visit: Payer: Self-pay | Admitting: Family Medicine

## 2018-04-30 DIAGNOSIS — F410 Panic disorder [episodic paroxysmal anxiety] without agoraphobia: Secondary | ICD-10-CM

## 2018-05-03 NOTE — Telephone Encounter (Signed)
Refilled 01/06/18 #45 refills 2  Last OV 03/26/18

## 2018-05-07 IMAGING — US US PELVIS COMPLETE TRANSABD/TRANSVAG
1 series · 14 of 25 positions shown · non-contrast
Comparison: None

CLINICAL DATA: Abnormal uterine bleeding, abdominal bloating



[Series 1: us pelvis complete transabd/transvag · 0.28mm/px · 14 of 70 slices shown]
[im 1/70]
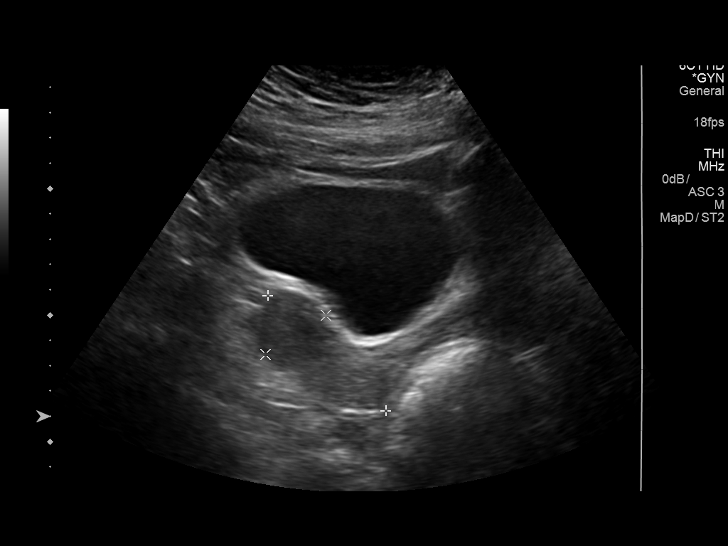
[im 6/70]
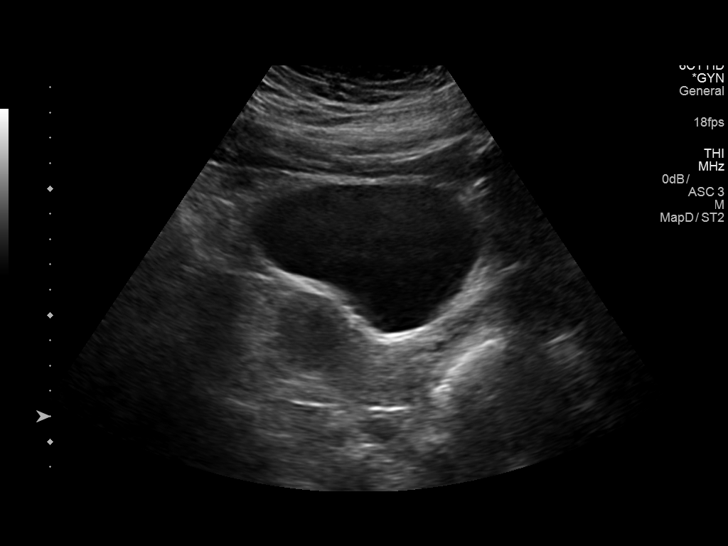
[im 12/70]
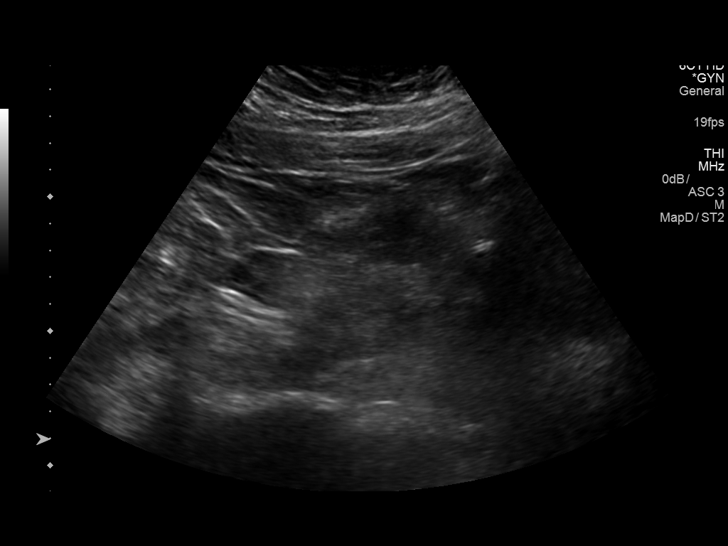
[im 18/70]
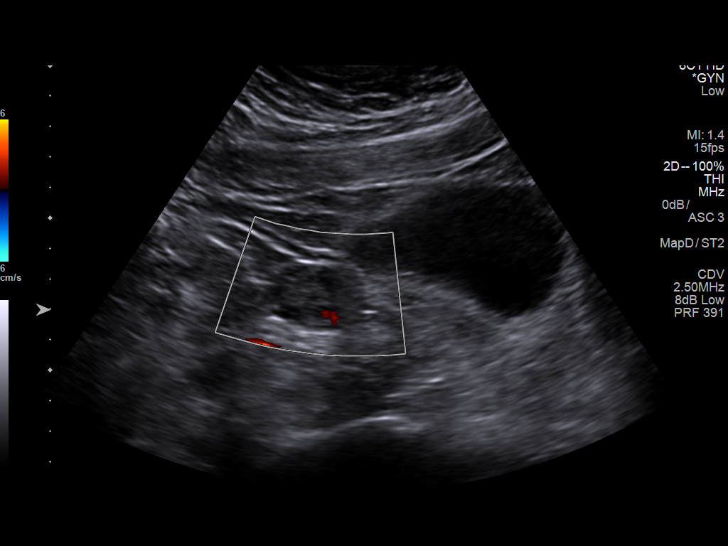
[im 24/70]
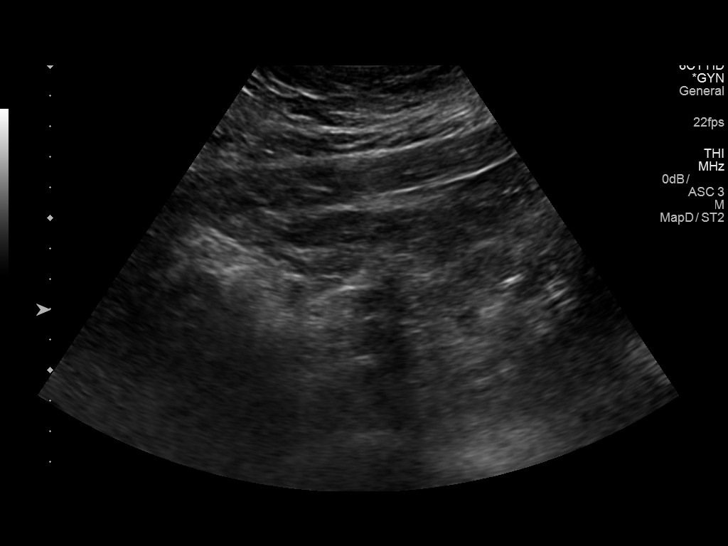
[im 26/70]
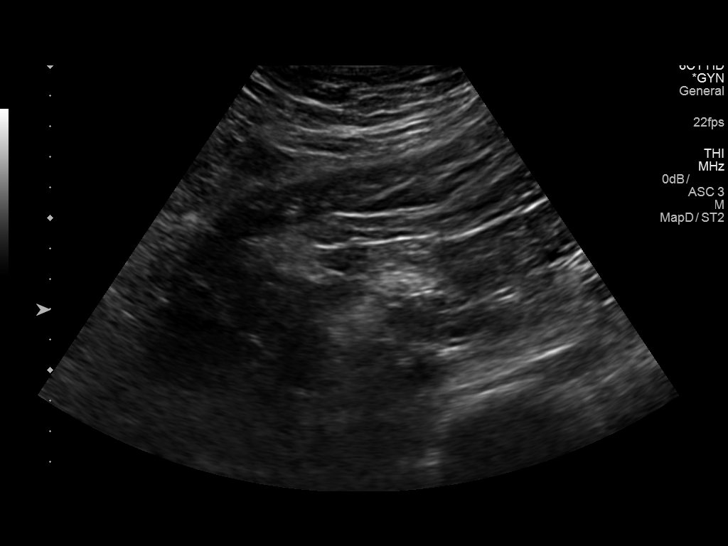
[im 32/70]
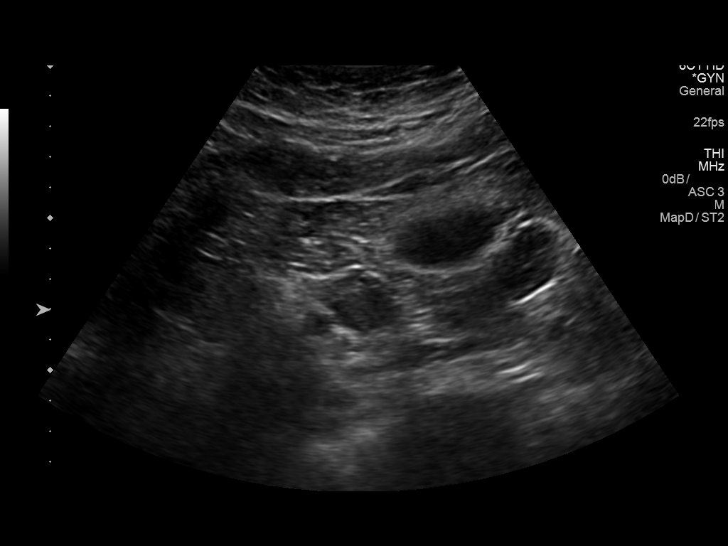
[im 38/70]
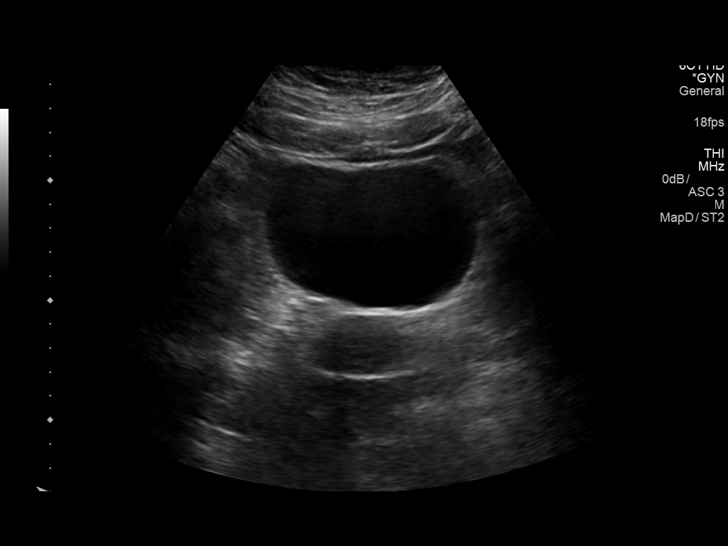
[im 44/70]
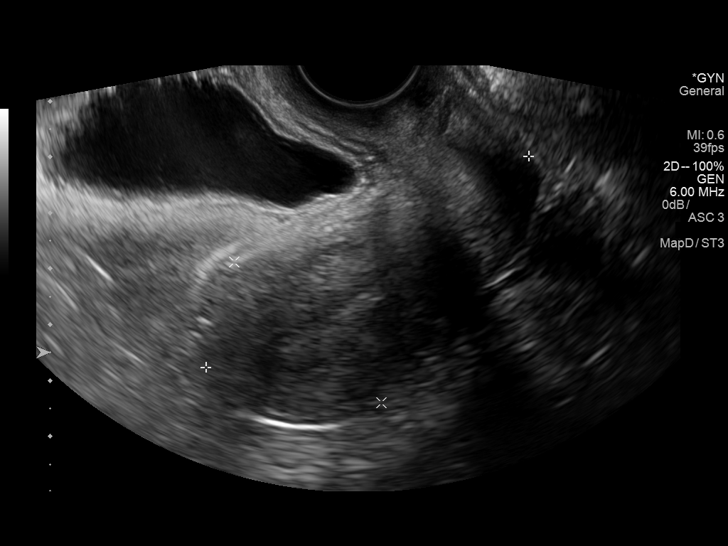
[im 47/70]
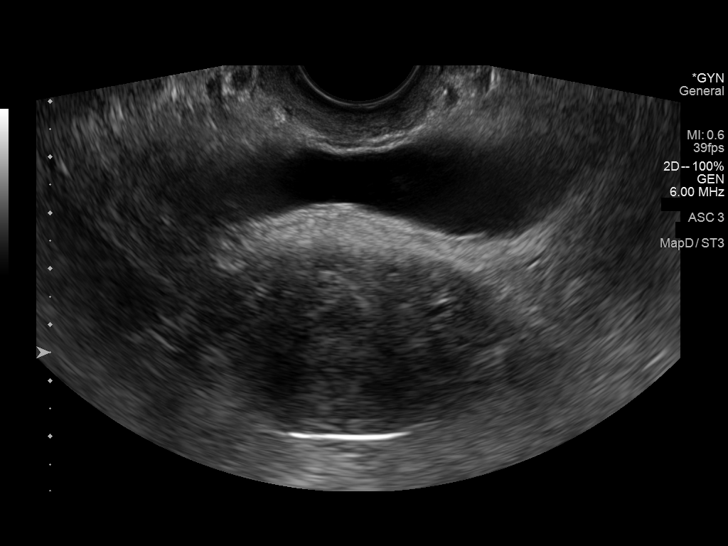
[im 52/70]
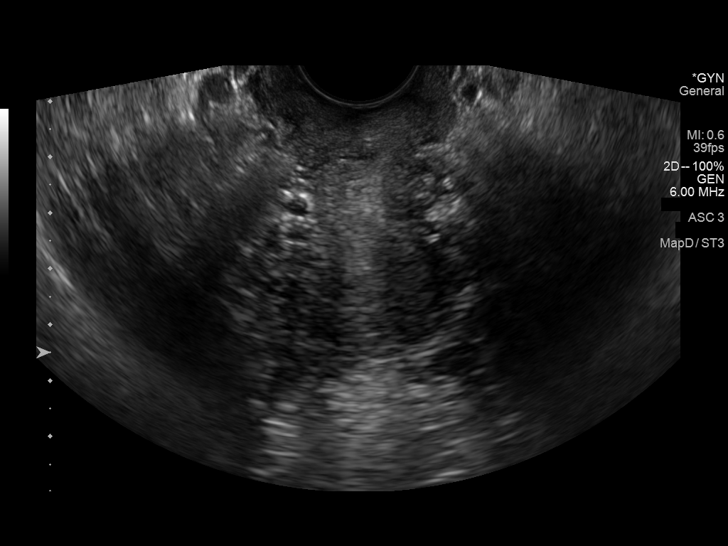
[im 58/70]
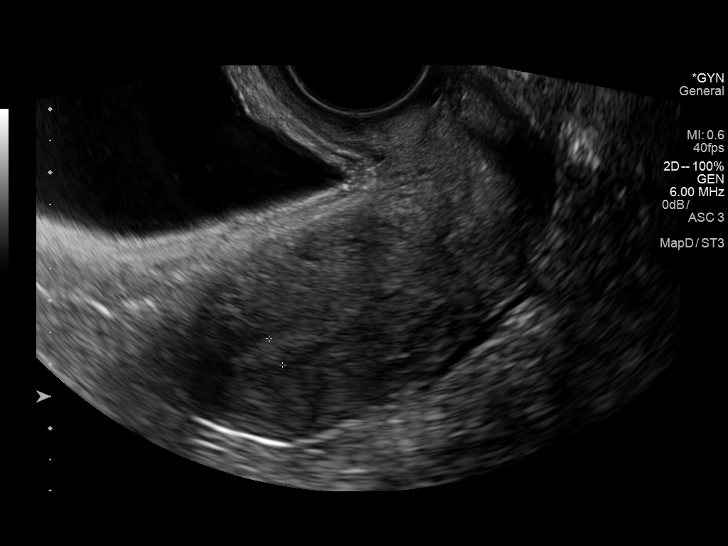
[im 64/70]
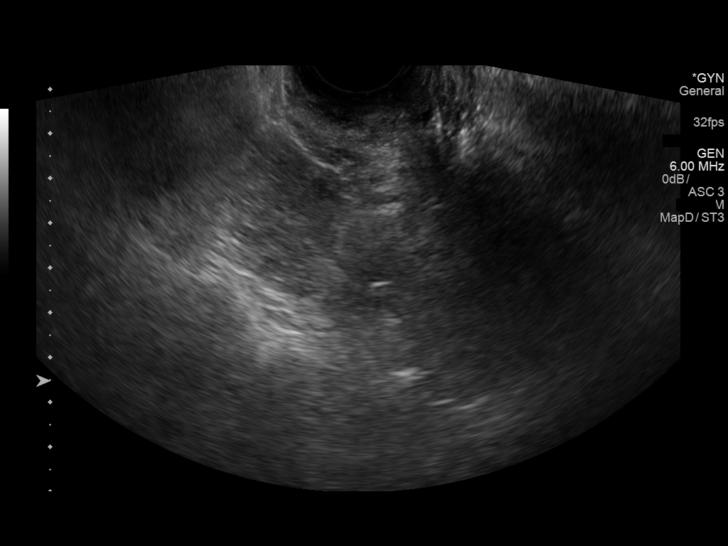
[im 70/70]
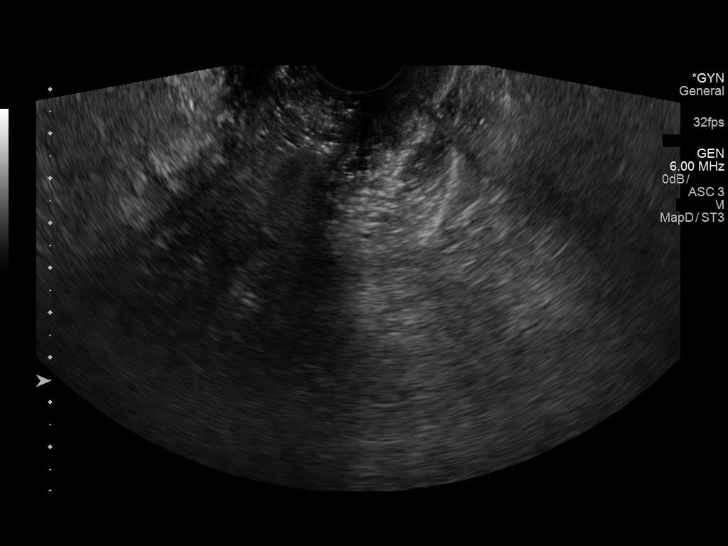

[14 of 25 positions shown; findings below may reference images not displayed]

FINDINGS: Uterus

Measurements: 6.9 x 3.6 x 4.3 cm.  Normal morphology without mass

Endometrium

Thickness: 5 mm thick, normal. No endometrial fluid or focal
abnormality

Right ovary

Measurements: 4.0 x 2.1 x 2.5 cm.  Normal morphology without mass

Left ovary

Measurements: 3.7 x 2.4 x 2.2 cm.  Normal morphology without mass.

Other findings

Trace free pelvic fluid, potentially physiologic. No adnexal masses.
IMPRESSION: Normal exam.

## 2018-05-07 IMAGING — US US THYROID
1 series · 14 of 25 positions shown · non-contrast
Comparison: None.

CLINICAL DATA: Palpable abnormality. Thyroid enlargement. Neck
fullness.

EXAM:
THYROID ULTRASOUND
TECHNIQUE: Ultrasound examination of the thyroid gland and adjacent soft
tissues was performed.

[Series 1: us thyroid · 0.04mm/px · 14 of 48 slices shown]
[im 1/48]
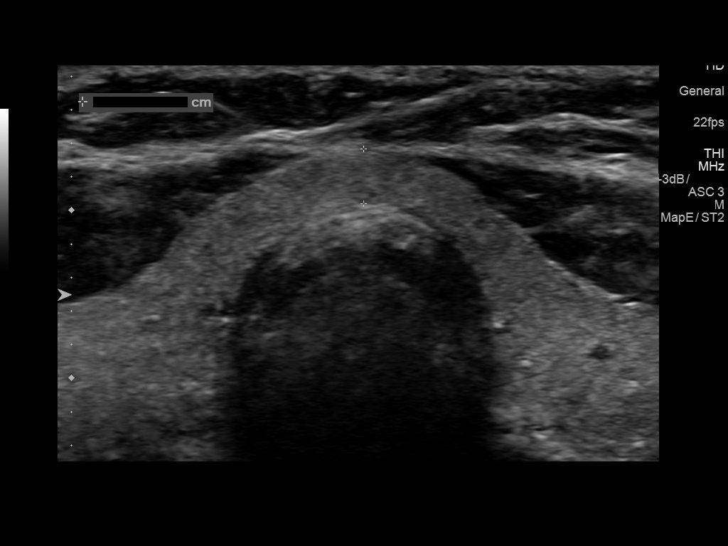
[im 4/48]
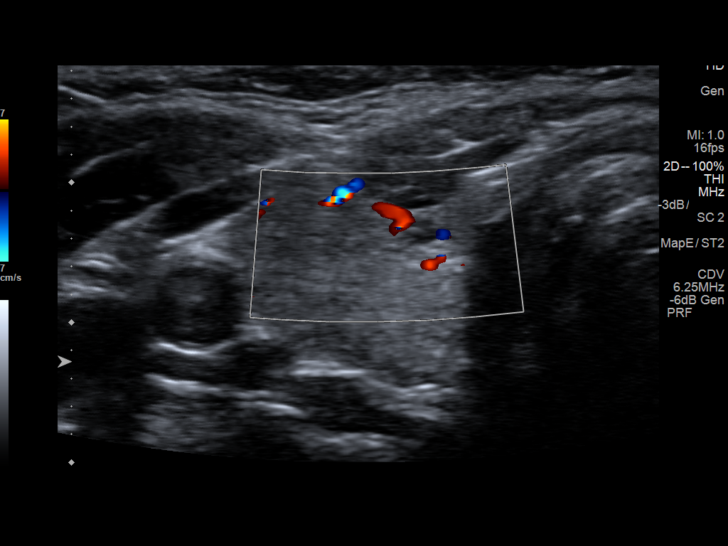
[im 8/48]
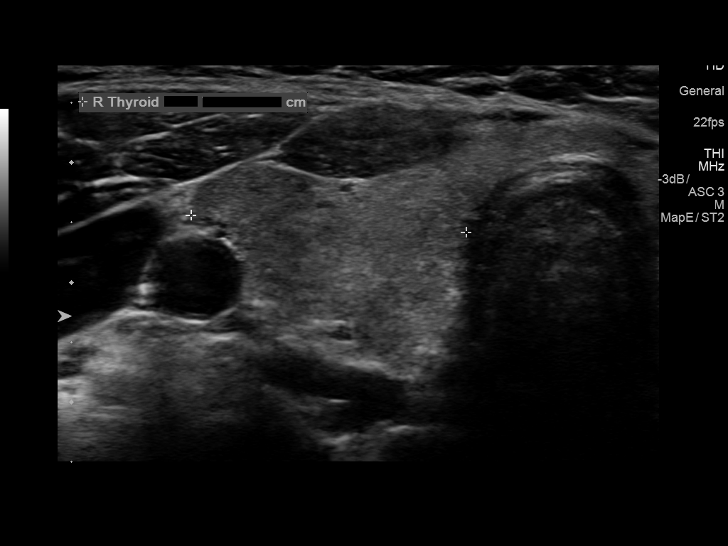
[im 12/48]
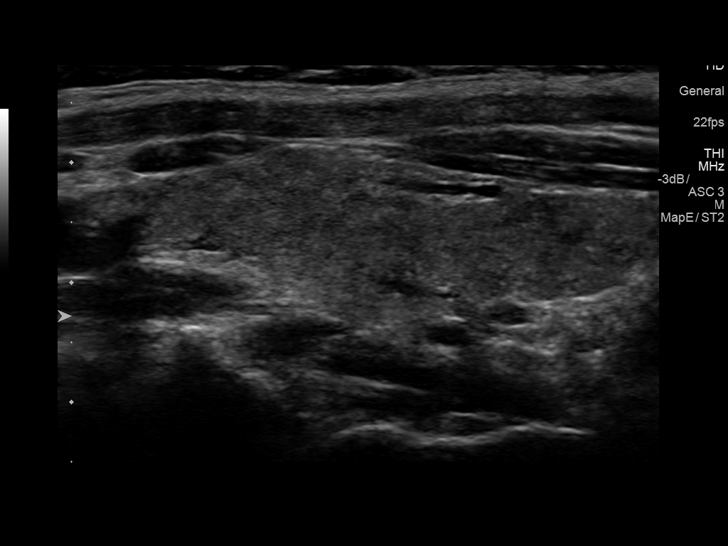
[im 16/48]
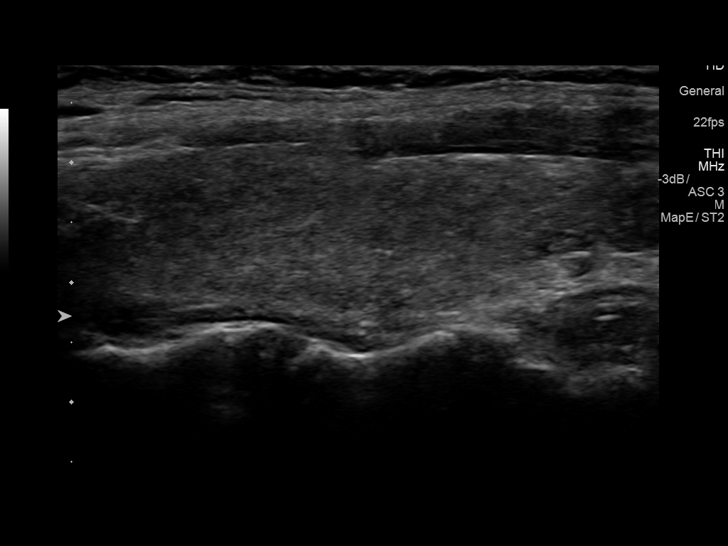
[im 18/48]
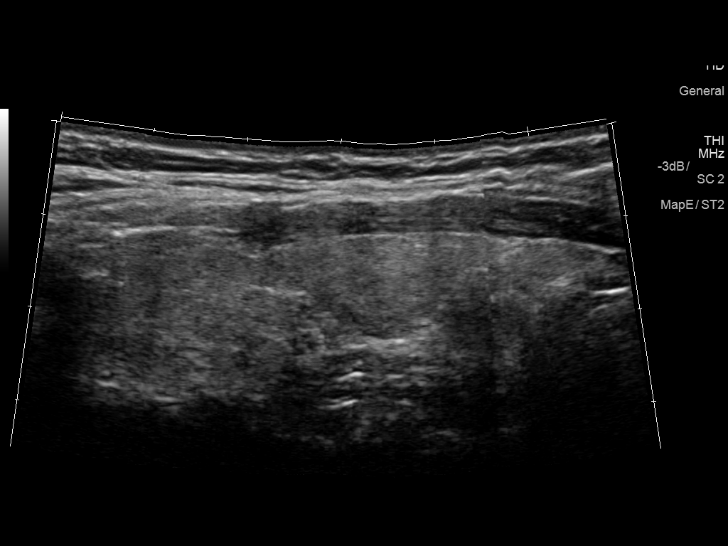
[im 22/48]
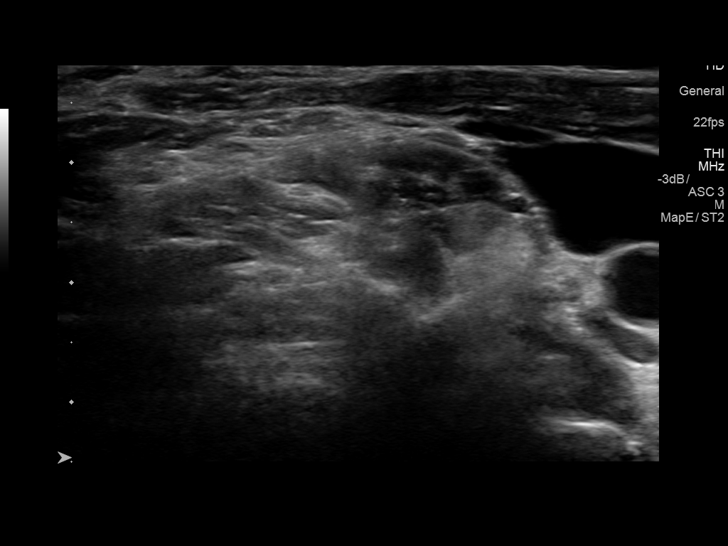
[im 26/48]
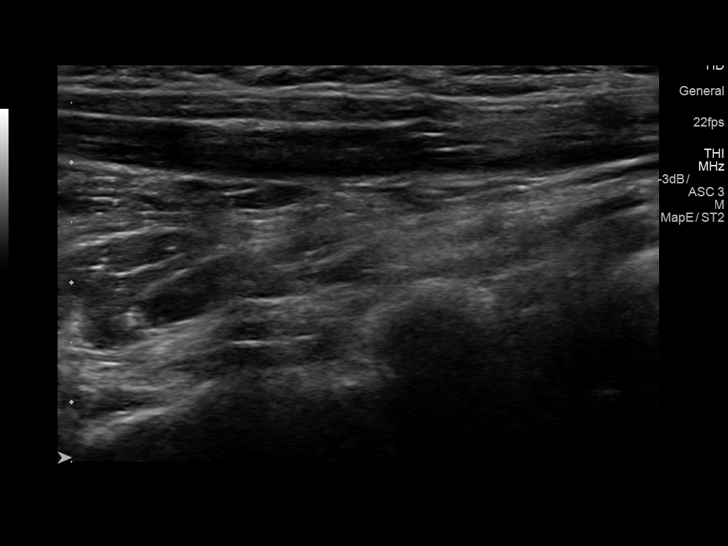
[im 30/48]
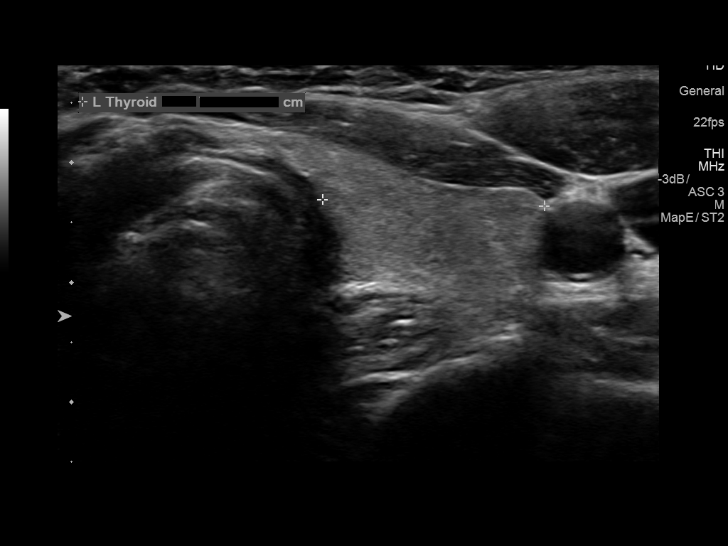
[im 32/48]
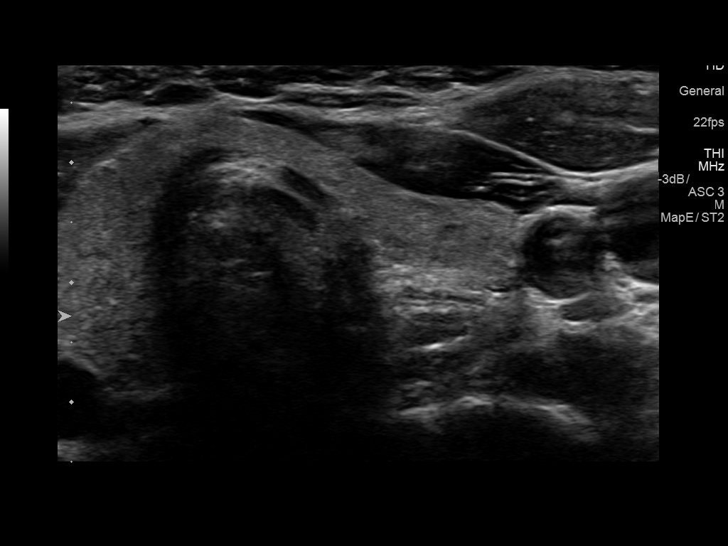
[im 36/48]
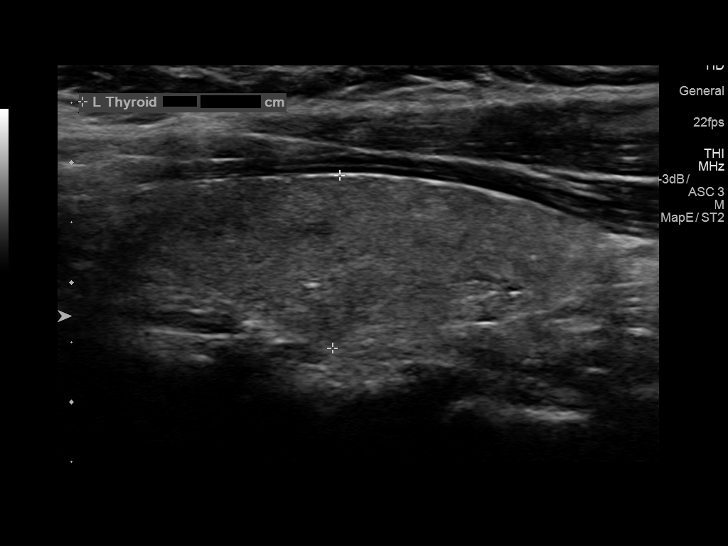
[im 40/48]
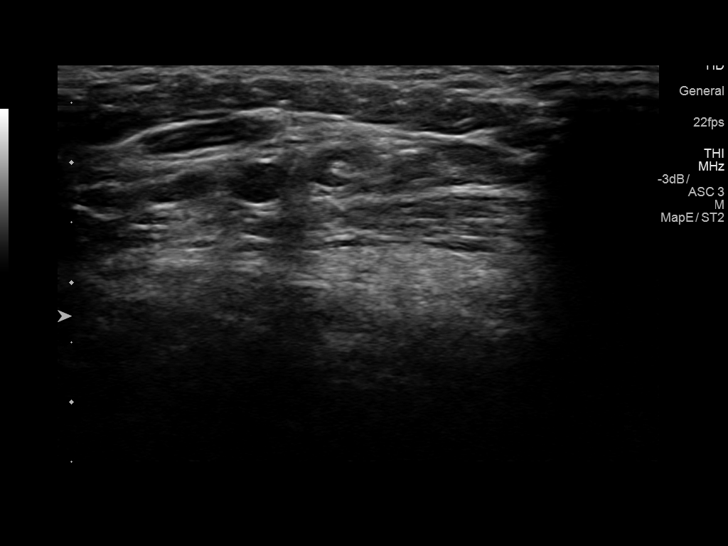
[im 44/48]
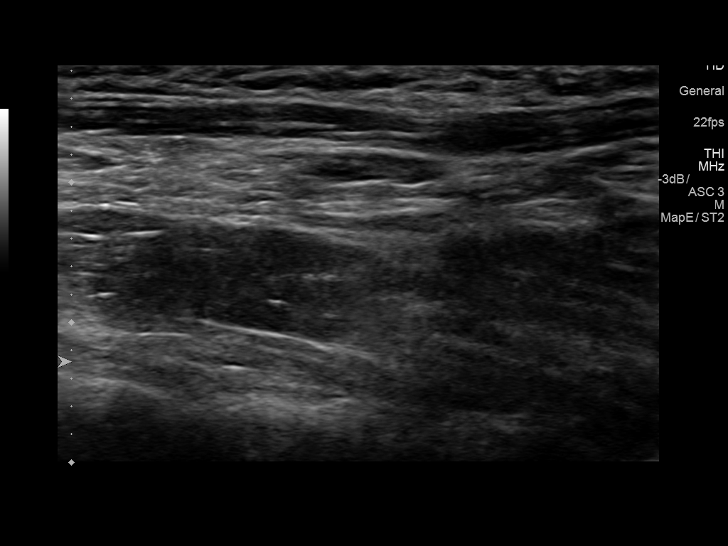
[im 48/48]
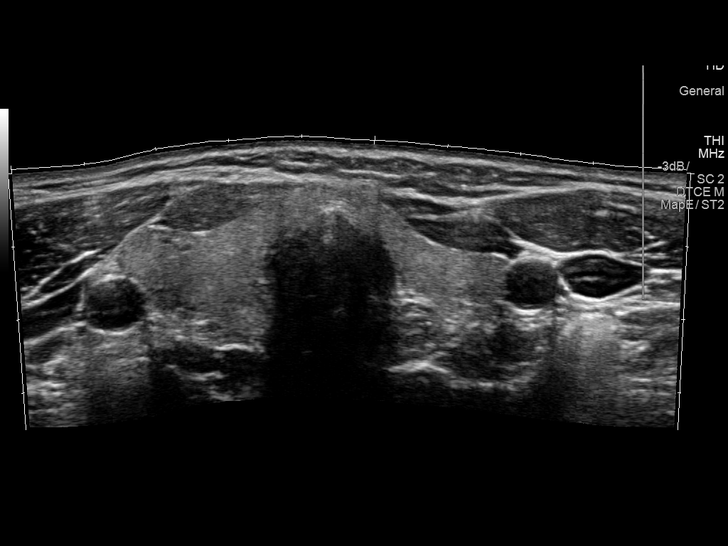

[14 of 25 positions shown; findings below may reference images not displayed]

FINDINGS: Parenchymal Echotexture: Mildly heterogenous

Isthmus: 0.3 cm

Right lobe: 5.6 x 1.7 x 2.3 cm

Left lobe: 3.9 x 1.4 x 1.9 cm

_________________________________________________________

Estimated total number of nodules >/= 1 cm: 0

Number of spongiform nodules >/=  2 cm not described below (TR1): 0

Number of mixed cystic and solid nodules >/= 1.5 cm not described
below (TR2): 0

_________________________________________________________

Small right lobe nodule measures 0.4 cm and does not meet criteria
for biopsy nor follow-up. Subcentimeter short axis diameter lymph
nodes are identified.
IMPRESSION: There are no suspicious nodules which meet criteria for biopsy or
follow-up. No abnormal soft tissue masses in the neck are
identified.

The above is in keeping with the ACR TI-RADS recommendations - [HOSPITAL] [CG];[DATE].

## 2018-05-23 ENCOUNTER — Other Ambulatory Visit: Payer: Self-pay | Admitting: Family Medicine

## 2018-05-26 ENCOUNTER — Ambulatory Visit (HOSPITAL_COMMUNITY)
Admission: EM | Admit: 2018-05-26 | Discharge: 2018-05-26 | Disposition: A | Discharge status: Home / Self Care | Payer: 59 | Attending: Family Medicine | Admitting: Family Medicine

## 2018-05-26 ENCOUNTER — Encounter (HOSPITAL_COMMUNITY): Payer: Self-pay | Admitting: Emergency Medicine

## 2018-05-26 DIAGNOSIS — B9689 Other specified bacterial agents as the cause of diseases classified elsewhere: Secondary | ICD-10-CM | POA: Diagnosis not present

## 2018-05-26 DIAGNOSIS — J329 Chronic sinusitis, unspecified: Secondary | ICD-10-CM | POA: Diagnosis not present

## 2018-05-26 MED ORDER — METHYLPREDNISOLONE ACETATE 40 MG/ML IJ SUSP
INTRAMUSCULAR | Status: AC
  Filled 2018-05-26: qty 1

## 2018-05-26 MED ORDER — FLUTICASONE PROPIONATE 50 MCG/ACT NA SUSP: 1.0000 | Freq: Every day | NASAL | 2 refills | Status: DC

## 2018-05-26 MED ORDER — CETIRIZINE HCL 5 MG PO TABS: 5.0000 mg | ORAL_TABLET | Freq: Every day | ORAL | 1 refills | Status: DC

## 2018-05-26 MED ORDER — METHYLPREDNISOLONE ACETATE 40 MG/ML IJ SUSP
40.0000 mg | Freq: Once | INTRAMUSCULAR | Status: AC
  Administered 2018-05-26: 40 mg via INTRAMUSCULAR

## 2018-05-26 MED ORDER — AMOXICILLIN-POT CLAVULANATE 875-125 MG PO TABS: 1.0000 | ORAL_TABLET | Freq: Two times a day (BID) | ORAL | 0 refills | Status: DC

## 2018-05-26 NOTE — Discharge Instructions (Addendum)
It was nice meeting you!!  We will go ahead and treat you for a sinus infection.  Steroid injection in the clinic for pain and inflammation.  Continue the tylenol as needed.  I would like for you to also try Flonase and zyrtec daily to see if this helps.  Follow up as needed for continued or worsening symptoms

## 2018-05-26 NOTE — ED Triage Notes (Signed)
Pt states for the last ten days she thought she had a sinus infection, a lot of facial pain, L ear pain, L neck pain, sore thraot

## 2018-05-26 NOTE — ED Provider Notes (Signed)
[place]    CSN: [id number] Arrival date & time: 05/26/18  1128     History   Chief Complaint Chief Complaint  Patient presents with  . Facial Pain  . Otalgia    HPI Yesenia Beasley is a 32 y.o. female.   Patient is a 32 year old female who presents with 10 days of left-sided maxillary sinus pain, pressure, swelling, left ear pain, left sore throat.  Symptoms have been constant and remain the same.  She reports some intermittent fevers and fatigue.  She denies any cough, congestion.  She has been taking Tylenol for pain which helps.   ROS per HPI      Past Medical History:  Diagnosis Date  . Anxiety   . Genetic testing 11/09/2017   Multi-Cancer panel (83 genes) @ [facility] - No pathogenic mutations detected    Patient Active Problem List   Diagnosis Date Noted  . Gastroesophageal reflux disease 01/06/2018  . Genetic testing 11/09/2017  . Dysmenorrhea 06/25/2017  . Panic disorder 06/25/2017  . Pseudotumor cerebri 06/25/2017  . History of colon polyps 06/25/2017  . Depression, recurrent ([place]) 06/25/2017  . Anxiety 06/15/2017    Past Surgical History:  Procedure Laterality Date  . COLONOSCOPY WITH ESOPHAGOGASTRODUODENOSCOPY (EGD)    . COLONOSCOPY WITH PROPOFOL N/A 10/05/2017   Procedure: COLONOSCOPY WITH PROPOFOL;  Surgeon: Jonathon Bellows, MD;  Location: [facility] ENDOSCOPY;  Service: Gastroenterology;  Laterality: N/A;  . KNEE SURGERY Bilateral 2002,2003,2014    OB History   None      Home Medications    Prior to Admission medications   Medication Sig Start Date End Date Taking? Authorizing Provider  ALPRAZolam Duanne Moron) 0.5 MG tablet Take 1-2 tablets (0.5-1 mg total) by mouth 2 (two) times daily as needed for anxiety. 05/03/18  Yes Elby Beck, FNP  omeprazole (PRILOSEC) 40 MG capsule Take 1 capsule (40 mg total) by mouth daily. 01/05/18 05/26/18 Yes Jonathon Bellows, MD  sertraline (ZOLOFT) 100 MG tablet Take 1 tablet (100 mg total) by mouth daily. 01/06/18   Yes Elby Beck, FNP  albuterol (PROVENTIL HFA;VENTOLIN HFA) 108 (90 Base) MCG/ACT inhaler TAKE 2 PUFFS BY MOUTH EVERY 6 HOURS AS NEEDED FOR WHEEZE OR SHORTNESS OF BREATH Patient not taking: Reported on 05/26/2018 05/24/18   Elby Beck, FNP  amoxicillin-clavulanate (AUGMENTIN) 875-125 MG tablet Take 1 tablet by mouth every 12 (twelve) hours. 05/26/18   Loura Halt A, NP  azithromycin (ZITHROMAX) 250 MG tablet Take 1 tablet (250 mg total) by mouth daily. Take first 2 tablets together, then 1 every day until finished. Patient not taking: Reported on 05/26/2018 03/24/18   Loura Halt A, NP  cetirizine (ZYRTEC) 5 MG tablet Take 1 tablet (5 mg total) by mouth daily. 05/26/18   Loura Halt A, NP  Clindamycin Phosphate foam Apply ONCE daily TO affected AREA OF inner THIGHS 08/12/17   [provider]  cyclobenzaprine (FLEXERIL) 5 MG tablet Take 5 mg by mouth 3 (three) times daily as needed for muscle spasms.    [provider]  fluticasone (FLONASE) 50 MCG/ACT nasal spray Place 1 spray into both nostrils daily. 05/26/18   Orvan July, NP    Family History Family History  Problem Relation Age of Onset  . Leukemia Mother        CMML; dx 50s; deceased 41  . Heart disease Father   . Colon polyps Father        initially in 79s; #/type unknown  . Wilm's tumor  Sister        deceased at 19  . Brain cancer Maternal Grandfather        astrocytoma; deceased 77  . Heart attack Paternal Grandfather        deceased at 52  . Thyroid cancer Maternal Uncle        dx 61s  . Non-Hodgkin's lymphoma Maternal Aunt     Social History Social History   Tobacco Use  . Smoking status: Current Every Day Smoker  . Smokeless tobacco: Never Used  Substance Use Topics  . 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 Drug use: No     Allergies   Patient has no known allergies.   Review of Systems Review of Systems     Physical Exam Triage Vital Signs ED Triage Vitals [05/26/18  1138]  Enc Vitals Group     BP 139/75     Pulse Rate 77     Resp 16     Temp 98.1 F (36.7 C)     Temp Source Oral     SpO2 99 %     Weight      Height      Head Circumference      Peak Flow      Pain Score      Pain Loc      Pain Edu?      Excl. in [place]?    No data found.  Updated Vital Signs BP 139/75   Pulse 77   Temp 98.1 F (36.7 C) (Oral)   Resp 16   LMP 05/24/2018   SpO2 99%   Visual Acuity Right Eye Distance:   Left Eye Distance:   Bilateral Distance:    Right Eye Near:   Left Eye Near:    Bilateral Near:     Physical Exam  Constitutional: She is oriented to person, place, and time. She appears well-developed and well-nourished.  Very pleasant. Non toxic or ill appearing.     HENT:  Head: Normocephalic and atraumatic.  Mouth/Throat: Oropharynx is clear and moist.  Bilateral TMs normal.  External ears normal.  Without posterior oropharyngeal erythema, tonsillar swelling or exudates. No lesions.   Mild left sided maxillary tenderness and swelling.      Eyes: Conjunctivae are normal.  Cardiovascular: Normal rate and regular rhythm.  Pulmonary/Chest:  Lungs clear in all fields. No dyspnea or distress. No retractions or nasal flaring.     Musculoskeletal: Normal range of motion.  Lymphadenopathy:    She has cervical adenopathy.  Neurological: She is alert and oriented to person, place, and time.  Skin: Skin is warm and dry.  Psychiatric: She has a normal mood and affect.  Nursing note and vitals reviewed.    UC Treatments / Results  Labs (all labs ordered are listed, but only abnormal results are displayed) Labs Reviewed - No data to display  EKG None  Radiology No results found.  Procedures Procedures (including critical care time)  Medications Ordered in UC Medications  methylPREDNISolone acetate (DEPO-MEDROL) injection 40 mg (40 mg Intramuscular Given 05/26/18 1240)    Initial Impression / Assessment and Plan / UC Course  I  have reviewed the triage vital signs and the nursing notes.  Pertinent labs & imaging results that were available during my care of the patient were reviewed by me and considered in my medical decision making (see chart for details).     Will go ahead and treat for bacterial sinusitis  with Augmentin Steroid injection in the clinic for inflammation and swelling. Flonase and zyrtec daily for symptoms. Follow up as needed for continued or worsening symptoms   Final Clinical Impressions(s) / UC Diagnoses   Final diagnoses:  Bacterial sinusitis     Discharge Instructions     It was nice meeting you!!  We will go ahead and treat you for a sinus infection.  Steroid injection in the clinic for pain and inflammation.  Continue the tylenol as needed.  I would like for you to also try Flonase and zyrtec daily to see if this helps.  Follow up as needed for continued or worsening symptoms     ED Prescriptions    Medication Sig Dispense Auth. Provider   amoxicillin-clavulanate (AUGMENTIN) 875-125 MG tablet Take 1 tablet by mouth every 12 (twelve) hours. 14 tablet Larrisha Babineau A, NP   fluticasone (FLONASE) 50 MCG/ACT nasal spray Place 1 spray into both nostrils daily. 16 g Tabor Denham A, NP   cetirizine (ZYRTEC) 5 MG tablet Take 1 tablet (5 mg total) by mouth daily. 30 tablet Loura Halt A, NP     Controlled Substance Prescriptions [place] Controlled Substance Registry consulted? Not Applicable   Orvan July, NP 05/26/18 1243

## 2018-06-12 ENCOUNTER — Emergency Department: Payer: 59

## 2018-06-12 ENCOUNTER — Emergency Department
Admission: EM | Admit: 2018-06-12 | Discharge: 2018-06-12 | Disposition: A | Discharge status: Home / Self Care | Payer: 59 | Attending: Emergency Medicine | Admitting: Emergency Medicine

## 2018-06-12 ENCOUNTER — Other Ambulatory Visit: Payer: Self-pay

## 2018-06-12 DIAGNOSIS — R5383 Other fatigue: Secondary | ICD-10-CM | POA: Diagnosis not present

## 2018-06-12 DIAGNOSIS — G4452 New daily persistent headache (NDPH): Secondary | ICD-10-CM | POA: Insufficient documentation

## 2018-06-12 DIAGNOSIS — Z79899 Other long term (current) drug therapy: Secondary | ICD-10-CM | POA: Diagnosis not present

## 2018-06-12 DIAGNOSIS — R140 Abdominal distension (gaseous): Secondary | ICD-10-CM | POA: Diagnosis not present

## 2018-06-12 DIAGNOSIS — R635 Abnormal weight gain: Secondary | ICD-10-CM | POA: Insufficient documentation

## 2018-06-12 DIAGNOSIS — R51 Headache: Secondary | ICD-10-CM | POA: Diagnosis present

## 2018-06-12 DIAGNOSIS — F17200 Nicotine dependence, unspecified, uncomplicated: Secondary | ICD-10-CM | POA: Diagnosis not present

## 2018-06-12 DIAGNOSIS — R509 Fever, unspecified: Secondary | ICD-10-CM

## 2018-06-12 LAB — COMPREHENSIVE METABOLIC PANEL
ALK PHOS: 59 U/L (ref 38–126)
ALT: 13 U/L (ref 0–44)
ANION GAP: 7 (ref 5–15)
AST: 11 U/L — ABNORMAL LOW (ref 15–41)
Albumin: 4 g/dL (ref 3.5–5.0)
BUN: 12 mg/dL (ref 6–20)
CO2: 26 mmol/L (ref 22–32)
Calcium: 9 mg/dL (ref 8.9–10.3)
Chloride: 106 mmol/L (ref 98–111)
Creatinine, Ser: 0.8 mg/dL (ref 0.44–1.00)
Glucose, Bld: 90 mg/dL (ref 70–99)
Potassium: 4.4 mmol/L (ref 3.5–5.1)
SODIUM: 139 mmol/L (ref 135–145)
Total Bilirubin: 0.7 mg/dL (ref 0.3–1.2)
Total Protein: 6.6 g/dL (ref 6.5–8.1)

## 2018-06-12 LAB — CBC WITH DIFFERENTIAL/PLATELET
BASOS ABS: 0.1 10*3/uL (ref 0–0.1)
BASOS PCT: 1 %
EOS ABS: 0.3 10*3/uL (ref 0–0.7)
Eosinophils Relative: 3 %
HEMATOCRIT: 41.9 % (ref 35.0–47.0)
HEMOGLOBIN: 13.6 g/dL (ref 12.0–16.0)
Lymphocytes Relative: 26 %
Lymphs Abs: 3 10*3/uL (ref 1.0–3.6)
MCH: 28.3 pg (ref 26.0–34.0)
MCHC: 32.3 g/dL (ref 32.0–36.0)
MCV: 87.7 fL (ref 80.0–100.0)
Monocytes Absolute: 0.7 10*3/uL (ref 0.2–0.9)
Monocytes Relative: 6 %
NEUTROS ABS: 7.7 10*3/uL — AB (ref 1.4–6.5)
NEUTROS PCT: 64 %
Platelets: 175 10*3/uL (ref 150–440)
RBC: 4.78 MIL/uL (ref 3.80–5.20)
RDW: 14.8 % — ABNORMAL HIGH (ref 11.5–14.5)
WBC: 11.7 10*3/uL — AB (ref 3.6–11.0)

## 2018-06-12 LAB — URINALYSIS, COMPLETE (UACMP) WITH MICROSCOPIC
Bacteria, UA: NONE SEEN
Bilirubin Urine: NEGATIVE
GLUCOSE, UA: NEGATIVE mg/dL
HGB URINE DIPSTICK: NEGATIVE
Ketones, ur: NEGATIVE mg/dL
LEUKOCYTES UA: NEGATIVE
NITRITE: NEGATIVE
PH: 7 (ref 5.0–8.0)
Protein, ur: NEGATIVE mg/dL
Specific Gravity, Urine: 1.018 (ref 1.005–1.030)

## 2018-06-12 LAB — HCG, QUANTITATIVE, PREGNANCY

## 2018-06-12 LAB — POCT PREGNANCY, URINE: Preg Test, Ur: NEGATIVE

## 2018-06-12 LAB — T4, FREE: Free T4: 0.84 ng/dL (ref 0.82–1.77)

## 2018-06-12 LAB — TSH: TSH: 4.132 u[IU]/mL (ref 0.350–4.500)

## 2018-06-12 IMAGING — CT CT HEAD W/O CM
3 series · 16 of 47 positions shown, 19 images · non-contrast
Comparison: None.

CLINICAL DATA: Headache, chronic, normal neuro exam

EXAM:
CT HEAD WITHOUT CONTRAST
TECHNIQUE: Contiguous axial images were obtained from the base of the skull
through the vertex without intravenous contrast.

[Series 3: head wo · axial · 0.43mm/px · z∈[+698,+823]mm · 10 of 30 slices shown, 13 images]
[im 3/30  brain]
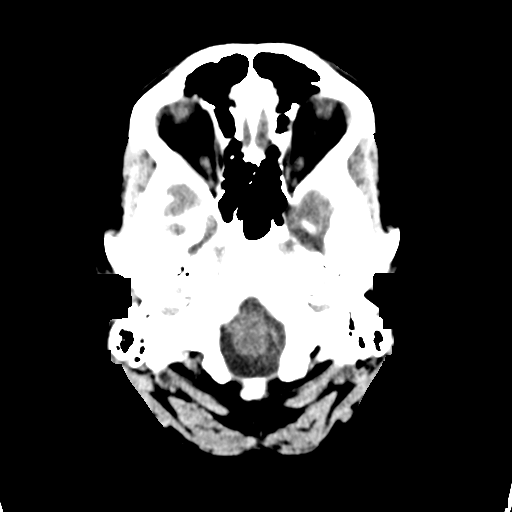
[im 3/30  bone]
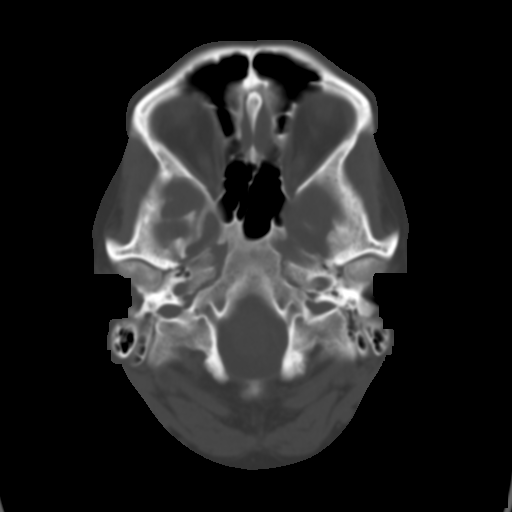
[im 6/30  brain]
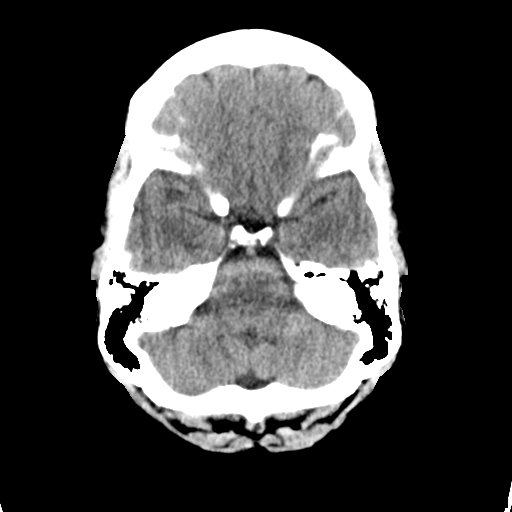
[im 9/30  brain]
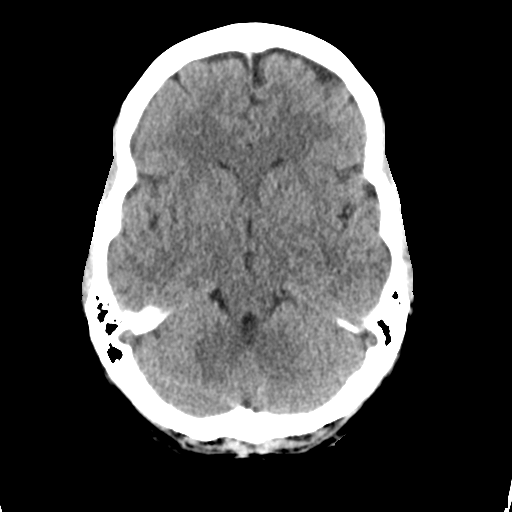
[im 11/30  brain]
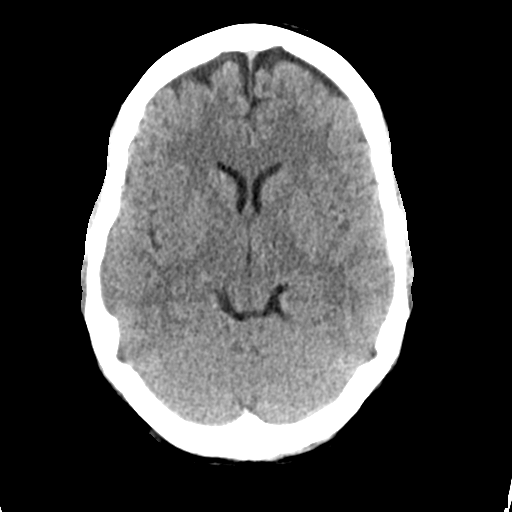
[im 14/30  brain]
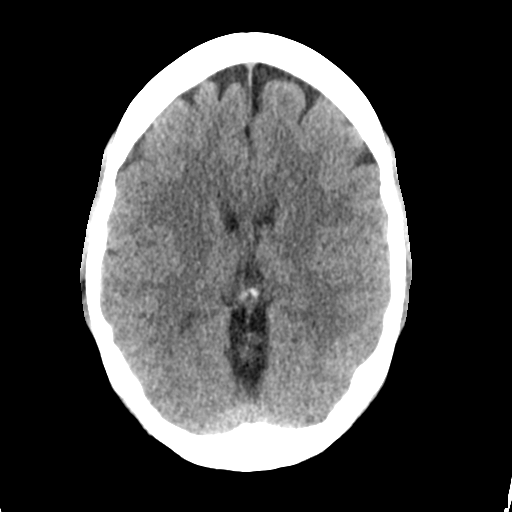
[im 14/30  bone]
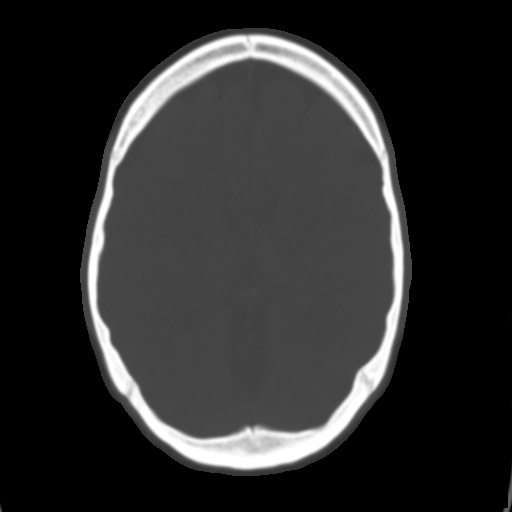
[im 17/30  brain]
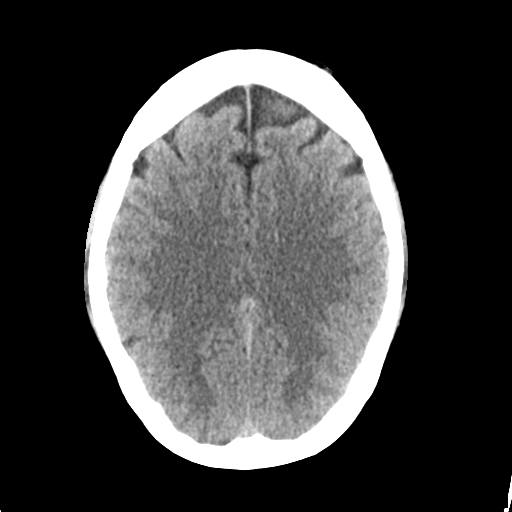
[im 20/30  brain]
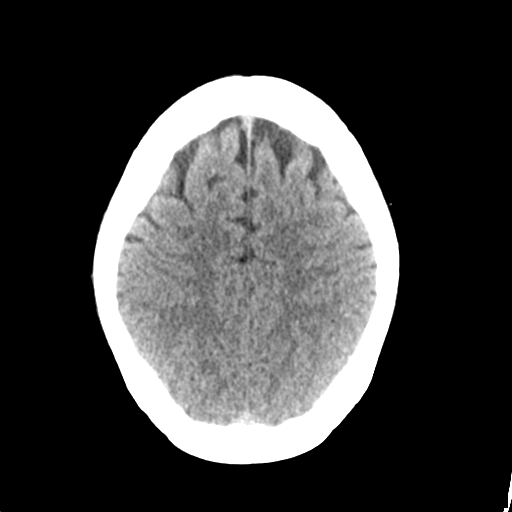
[im 23/30  brain]
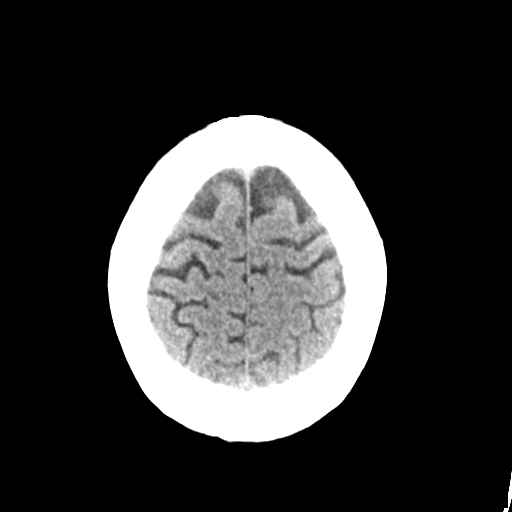
[im 25/30  brain]
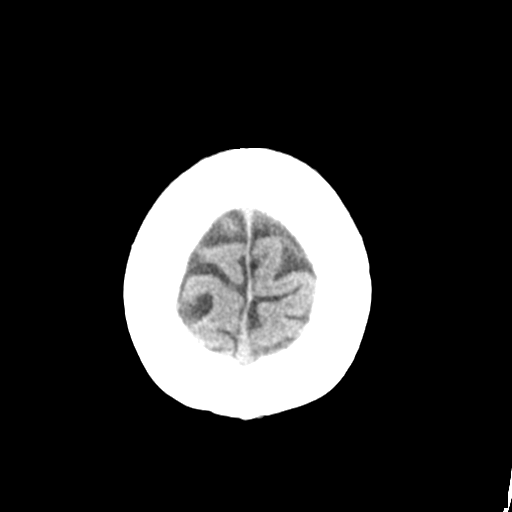
[im 25/30  bone]
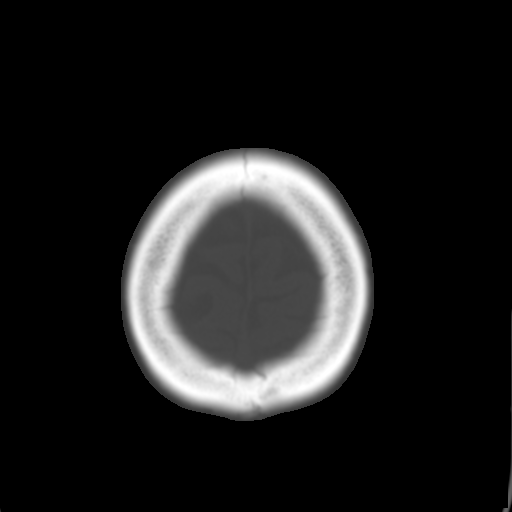
[im 28/30  brain]
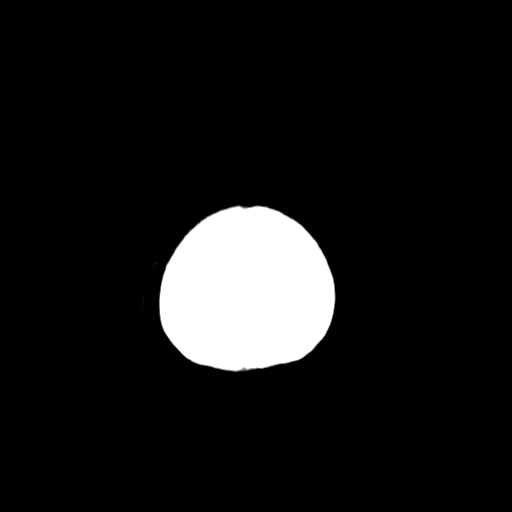

[Series 4: coronal soft tissue · coronal · 0.29mm/px · 3 of 65 slices shown]
[im 22/65  brain]
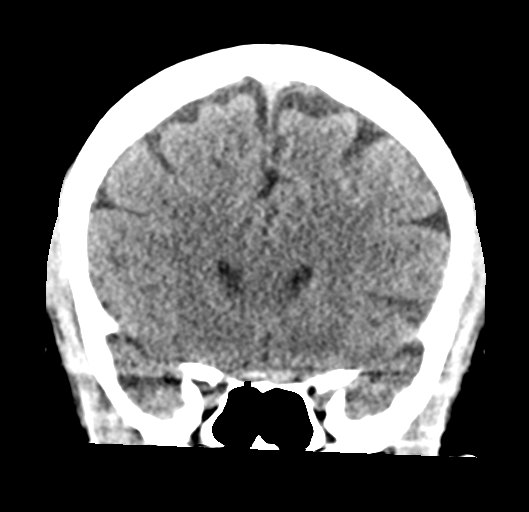
[im 29/65  brain]
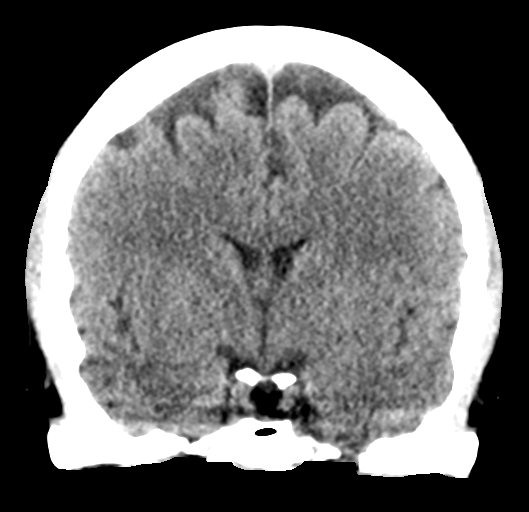
[im 36/65  brain]
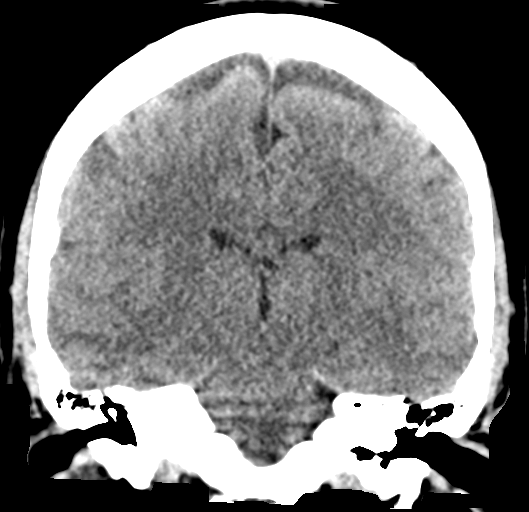

[Series 5: sagittal soft tissue · sagittal · 0.29mm/px · 3 of 53 slices shown]
[im 18/53  brain]
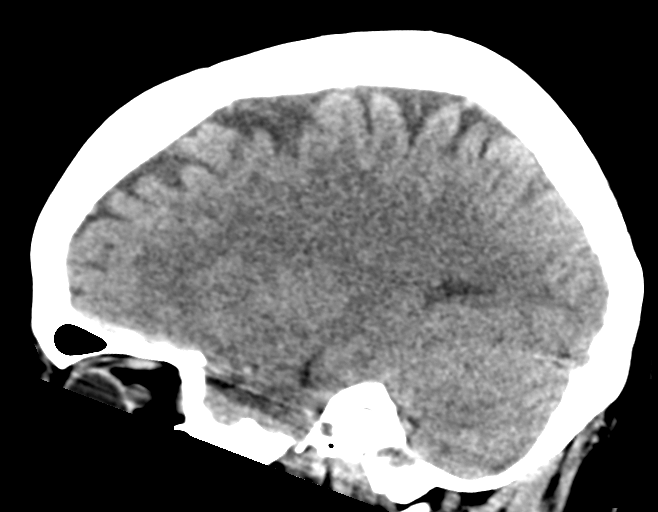
[im 27/53  brain]
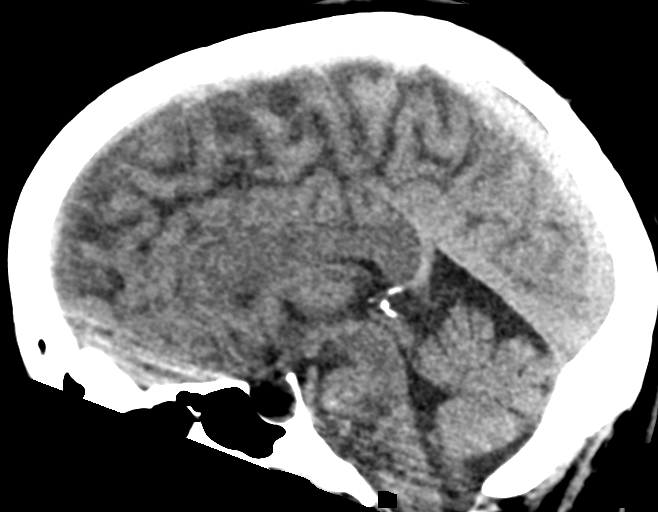
[im 35/53  brain]
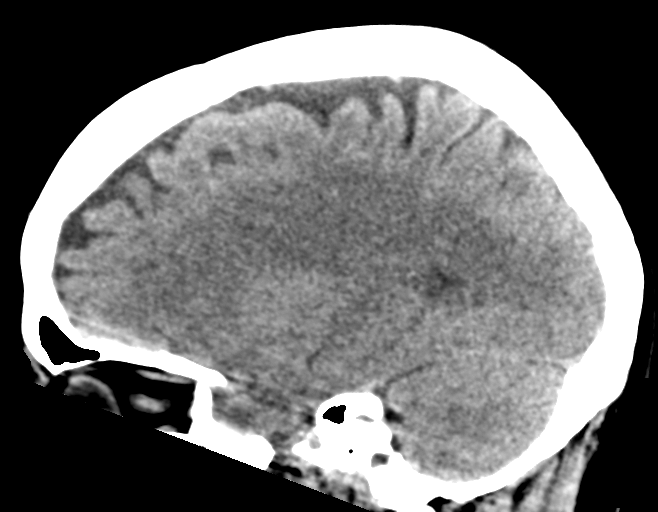

[16 of 47 positions shown; findings below may reference images not displayed]

FINDINGS: Brain: No intracranial hemorrhage, mass effect, or midline shift. No
hydrocephalus. The basilar cisterns are patent. Incidental 1 cm
peripherally calcified pineal cyst, no clinical significance. No
evidence of territorial infarct or acute ischemia. No extra-axial or
intracranial fluid collection.

Vascular: No hyperdense vessel or unexpected calcification.

Skull: No fracture or focal lesion.

Sinuses/Orbits: Paranasal sinuses and mastoid air cells are clear.
The visualized orbits are unremarkable.

Other: None.
IMPRESSION: No acute intracranial abnormality or explanation for headache.

## 2018-06-12 MED ORDER — SODIUM CHLORIDE 0.9 % IV BOLUS
1000.0000 mL | Freq: Once | INTRAVENOUS | Status: AC
  Administered 2018-06-12: 1000 mL via INTRAVENOUS

## 2018-06-12 MED ORDER — BUTALBITAL-APAP-CAFFEINE 50-325-40 MG PO TABS: 1.0000 | ORAL_TABLET | Freq: Four times a day (QID) | ORAL | 0 refills | Status: DC | PRN

## 2018-06-12 NOTE — ED Provider Notes (Signed)
[facility] Emergency Department Provider Note  ____________________________________________  Time seen: Approximately 9:14 PM  I have reviewed the triage vital signs and the nursing notes.   HISTORY  Chief Complaint Fever and Headache    HPI Yesenia Beasley is a 32 y.o. female who presents the emergency department complaining of new daily headache, chronic daily low-grade temperature, mild burning sensation.  Patient presents stating that over the past year she has had multiple infectious complaints to include her first UTI, pneumonia, sinus infection.  Patient initially thought most of her complaints revolves around her infections.  Patient states that she has had a new daily occipital headache, some bloating, intermittent fatigue, possible weight gain, heat intolerance, changes in hair texture.  Patient is concerned as she has a history of pseudotumor cerebri, dysmenorrhea, GERD, as well as anxiety and panic attacks.   Patient is most concerned with new onset of daily headaches that are not typical with her pseudotumor cerebri.  Patient denies any headache currently, visual changes, chest pain, shortness of breath, abdominal pain, nausea or vomiting, diarrhea or constipation, dysuria, polyuria, hematuria.   Past Medical History:  Diagnosis Date  . Anxiety   . Genetic testing 11/09/2017   Multi-Cancer panel (83 genes) @ [facility] - No pathogenic mutations detected    Patient Active Problem List   Diagnosis Date Noted  . Gastroesophageal reflux disease 01/06/2018  . Genetic testing 11/09/2017  . Dysmenorrhea 06/25/2017  . Panic disorder 06/25/2017  . Pseudotumor cerebri 06/25/2017  . History of colon polyps 06/25/2017  . Depression, recurrent ([place]) 06/25/2017  . Anxiety 06/15/2017    Past Surgical History:  Procedure Laterality Date  . COLONOSCOPY WITH ESOPHAGOGASTRODUODENOSCOPY (EGD)    . COLONOSCOPY WITH PROPOFOL N/A 10/05/2017   Procedure: COLONOSCOPY  WITH PROPOFOL;  Surgeon: Jonathon Bellows, MD;  Location: [facility] ENDOSCOPY;  Service: Gastroenterology;  Laterality: N/A;  . KNEE SURGERY Bilateral 2002,2003,2014    Prior to Admission medications   Medication Sig Start Date End Date Taking? Authorizing Provider  albuterol (PROVENTIL HFA;VENTOLIN HFA) 108 (90 Base) MCG/ACT inhaler TAKE 2 PUFFS BY MOUTH EVERY 6 HOURS AS NEEDED FOR WHEEZE OR SHORTNESS OF BREATH Patient not taking: Reported on 05/26/2018 05/24/18   Elby Beck, FNP  ALPRAZolam Duanne Moron) 0.5 MG tablet Take 1-2 tablets (0.5-1 mg total) by mouth 2 (two) times daily as needed for anxiety. 05/03/18   Elby Beck, FNP  butalbital-acetaminophen-caffeine (FIORICET, ESGIC) [phone] MG tablet Take 1 tablet by mouth every 6 (six) hours as needed for headache. 06/12/18 06/12/19  Cuthriell, Charline Bills, PA-C  cetirizine (ZYRTEC) 5 MG tablet Take 1 tablet (5 mg total) by mouth daily. 05/26/18   Loura Halt A, NP  cyclobenzaprine (FLEXERIL) 5 MG tablet Take 5 mg by mouth 3 (three) times daily as needed for muscle spasms.    [provider]  fluticasone (FLONASE) 50 MCG/ACT nasal spray Place 1 spray into both nostrils daily. 05/26/18   Loura Halt A, NP  omeprazole (PRILOSEC) 40 MG capsule Take 1 capsule (40 mg total) by mouth daily. 01/05/18 05/26/18  Jonathon Bellows, MD  sertraline (ZOLOFT) 100 MG tablet Take 1 tablet (100 mg total) by mouth daily. 01/06/18   Elby Beck, FNP    Allergies Patient has no known allergies.  Family History  Problem Relation Age of Onset  . Leukemia Mother        CMML; dx 24s; deceased 13  . Heart disease Father   . Colon polyps Father  initially in 56s; #/type unknown  . Wilm's tumor Sister        deceased at 2  . Brain cancer Maternal Grandfather        astrocytoma; deceased 90  . Heart attack Paternal Grandfather        deceased at 65  . Thyroid cancer Maternal Uncle        dx 11s  . Non-Hodgkin's lymphoma Maternal Aunt     Social  History Social History   Tobacco Use  . Smoking status: Current Every Day Smoker  . Smokeless tobacco: Never Used  Substance Use Topics  . 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 Drug use: No     Review of Systems  Constitutional: Intermittent low-grade fever/chills.  Increasing heat intolerance.  Unintentional weight gain. Eyes: No visual changes. No discharge ENT: No upper respiratory complaints. Cardiovascular: no chest pain. Respiratory: no cough. No SOB. Gastrointestinal: No abdominal pain.  No nausea, no vomiting.  No diarrhea.  No constipation. Genitourinary: Negative for dysuria. No hematuria.  No vaginal bleeding or discharge. Musculoskeletal: Negative for musculoskeletal pain. Skin: Negative for rash, abrasions, lacerations, ecchymosis. Neurological: New daily occipital headache.  Denies focal weakness or numbness. 10-point ROS otherwise negative.  ____________________________________________   PHYSICAL EXAM:  VITAL SIGNS: ED Triage Vitals  Enc Vitals Group     BP 06/12/18 1956 126/73     Pulse Rate 06/12/18 1956 80     Resp 06/12/18 1956 17     Temp 06/12/18 1956 98.3 F (36.8 C)     Temp Source 06/12/18 1956 Oral     SpO2 06/12/18 1956 95 %     Weight 06/12/18 1957 244 lb 11.4 oz (111 kg)     Height --      Head Circumference --      Peak Flow --      Pain Score 06/12/18 1957 5     Pain Loc --      Pain Edu? --      Excl. in [place]? --      Constitutional: Alert and oriented. Well appearing and in no acute distress. Eyes: Conjunctivae are normal. PERRL. EOMI. Head: Atraumatic. ENT:      Ears: EACs unremarkable bilaterally.  TM is minimally bulging bilaterally.      Nose: Mild clear congestion/rhinnorhea.  Turbinates are slightly boggy.  No tenderness to percussion over the sinuses.      Mouth/Throat: Mucous membranes are moist.  Oropharynx is not erythematous and nonedematous.  Uvula is midline. Neck: No stridor.  Neck is supple full range of  motion Hematological/Lymphatic/Immunilogical: No cervical lymphadenopathy. Cardiovascular: Normal rate, regular rhythm. Normal S1 and S2.  Good peripheral circulation. Respiratory: Normal respiratory effort without tachypnea or retractions. Lungs CTAB. Good air entry to the bases with no decreased or absent breath sounds. Gastrointestinal: Bowel sounds 4 quadrants. Soft and nontender to palpation. No guarding or rigidity. No palpable masses. No distention. No CVA tenderness. Musculoskeletal: Full range of motion to all extremities. No gross deformities appreciated. Neurologic:  Normal speech and language. No gross focal neurologic deficits are appreciated.  Cranial nerves II through XII grossly intact.  Negative Romberg's and pronator drift. Skin:  Skin is warm, dry and intact. No rash noted. Psychiatric: Mood and affect are normal. Speech and behavior are normal. Patient exhibits appropriate insight and judgement.   ____________________________________________   LABS (all labs ordered are listed, but only abnormal results are displayed)  Labs Reviewed  COMPREHENSIVE METABOLIC  PANEL - Abnormal; Notable for the following components:      Result Value   AST 11 (*)    All other components within normal limits  CBC WITH DIFFERENTIAL/PLATELET - Abnormal; Notable for the following components:   WBC 11.7 (*)    RDW 14.8 (*)    Neutro Abs 7.7 (*)    All other components within normal limits  URINALYSIS, COMPLETE (UACMP) WITH MICROSCOPIC - Abnormal; Notable for the following components:   Color, Urine YELLOW (*)    APPearance CLOUDY (*)    All other components within normal limits  TSH  T4, FREE  HCG, QUANTITATIVE, PREGNANCY  T3, FREE  PROLACTIN  FOLLICLE STIMULATING HORMONE  POC URINE PREG, ED  POCT PREGNANCY, URINE   ____________________________________________  EKG   ____________________________________________  RADIOLOGY I personally viewed and evaluated these images as  part of my medical decision making, as well as reviewing the written report by the radiologist.  Ct Head Wo Contrast  Result Date: 06/12/2018 CLINICAL DATA:  Headache, chronic, normal neuro exam EXAM: CT HEAD WITHOUT CONTRAST TECHNIQUE: Contiguous axial images were obtained from the base of the skull through the vertex without intravenous contrast. COMPARISON:  None. FINDINGS: Brain: No intracranial hemorrhage, mass effect, or midline shift. No hydrocephalus. The basilar cisterns are patent. 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 No evidence of territorial infarct or acute ischemia. No extra-axial or intracranial fluid collection. Vascular: No hyperdense vessel or unexpected calcification. Skull: No fracture or focal lesion. Sinuses/Orbits: Paranasal sinuses and mastoid air cells are clear. The visualized orbits are unremarkable. Other: None. IMPRESSION: No acute intracranial abnormality or explanation for headache. Electronically Signed   By: Keith Rake M.D.   On: 06/12/2018 22:39    ____________________________________________    PROCEDURES  Procedure(s) performed:    Procedures    Medications  sodium chloride 0.9 % bolus 1,000 mL (1,000 mLs Intravenous New Bag/Given 06/12/18 2208)     ____________________________________________   INITIAL IMPRESSION / ASSESSMENT AND PLAN / ED COURSE  Pertinent labs & imaging results that were available during my care of the patient were reviewed by me and considered in my medical decision making (see chart for details).  Review of the  CSRS was performed in accordance of the [place] prior to dispensing any controlled drugs.  Clinical Course as of Jun 12 2342  Sat Jun 12, 2018  2118 Patient presents the emergency department complaining of new daily headache, mild bloating, intermittent fatigue, increased weight gain, heat intolerance, changes in her hair texture.  Patient reports that she has had  multiple infections over the summer which are atypical for her.  Patient initially thought symptoms were related to infectious origins.  However, patient's symptoms have persisted.  Patient does have a familial history of hypothyroidism.  Patient does have a history of recurrent left-sided ovarian cysts.  Given patient's complaints, likely metabolic syndrome/origin.  At this time, patient has had no recent imaging of her brain in the past 10 years to evaluate her pseudotumor cerebri.  Given new, atypical daily headache, CT scan will be ordered.  In addition, basic labs, urinalysis, pregnancy test, TSH, T3, T4, prolactin, FSH will also be ordered to evaluate for possible fibroid or polycystic ovarian syndrome origin of symptoms.  I discussed at length with patient that this is not a comprehensive exam or testing.  If results return negative, patient will follow-up with primary care for further investigation of chronic complaints.  Differential includes migraine headache, tumor,  change in pseudotumor cerebri, PCOS, thyroid origin, pregnancy, diabetes, malignancy.   [JC]    Clinical Course User Index [JC] Cuthriell, Charline Bills, PA-C     Patient's diagnosis is consistent with new persistent daily headache, increased body temperature.  Patient presents emergency department with multiple complaints.  These complaints have been progressive, gradually developing over several months duration.  This includes intermittent fatigue, intermittent increased body temperature, new persistent daily headache, weight gain, temperature intolerance.  Differential is extensive to include metabolic syndromes, hypo-or hyperthyroidism, PCOS, complication of pseudotumor cerebri.  CT scan reveals no acute intracranial abnormalities.  Labs returned with overall reassuring results.  Some lab work-up is outstanding but will be followed up by primary care for which patient has an appointment in 4 days.  Patient will be prescribed Fioricet  for headache.  She is encouraged to drink plenty of fluids.  She is also encouraged to take a daily diary to include any symptoms, activity and food intake.  No further work-up at this time.  Follow-up with primary care in 4 days.  Patient is given ED precautions to return to the ED for any worsening or new symptoms.     ____________________________________________  FINAL CLINICAL IMPRESSION(S) / ED DIAGNOSES  Final diagnoses:  New persistent daily headache  Increased body temperature      NEW MEDICATIONS STARTED DURING THIS VISIT:  ED Discharge Orders         Ordered    butalbital-acetaminophen-caffeine (FIORICET, ESGIC) 50-325-40 MG tablet  Every 6 hours PRN     06/12/18 2340              This chart was dictated using voice recognition software/Dragon. Despite best efforts to proofread, errors can occur which can change the meaning. Any change was purely unintentional.    Darletta Moll, PA-C 06/12/18 2343    Nena Polio, MD 06/13/18 0157

## 2018-06-12 NOTE — ED Triage Notes (Addendum)
Patient c/o intermittent fever (99.5-100.1) and headache X 2 weeks. Patient dx with sinus infection, and started on amoxicillin. Patient completed course of amoxicillin. patient reports continued headache and fever.

## 2018-06-12 NOTE — ED Notes (Signed)
Pt c/o increasing generalized malaise, weakness and low-grade fever x 1 month.

## 2018-06-14 LAB — PROLACTIN: Prolactin: 23.7 ng/mL — ABNORMAL HIGH (ref 4.8–23.3)

## 2018-06-14 LAB — FOLLICLE STIMULATING HORMONE: FSH: 1.9 m[IU]/mL

## 2018-06-14 LAB — T3, FREE: T3, Free: 3 pg/mL (ref 2.0–4.4)

## 2018-06-16 ENCOUNTER — Encounter: Payer: Self-pay | Admitting: Family Medicine

## 2018-06-16 ENCOUNTER — Ambulatory Visit (INDEPENDENT_AMBULATORY_CARE_PROVIDER_SITE_OTHER): Payer: 59 | Admitting: Family Medicine

## 2018-06-16 VITALS — BP 114/72 | HR 80 | Temp 98.3°F | Ht 66.0 in | Wt 250.0 lb

## 2018-06-16 DIAGNOSIS — R140 Abdominal distension (gaseous): Secondary | ICD-10-CM | POA: Diagnosis not present

## 2018-06-16 DIAGNOSIS — R221 Localized swelling, mass and lump, neck: Secondary | ICD-10-CM | POA: Diagnosis not present

## 2018-06-16 DIAGNOSIS — K5900 Constipation, unspecified: Secondary | ICD-10-CM | POA: Diagnosis not present

## 2018-06-16 DIAGNOSIS — N939 Abnormal uterine and vaginal bleeding, unspecified: Secondary | ICD-10-CM

## 2018-06-16 DIAGNOSIS — Z0189 Encounter for other specified special examinations: Secondary | ICD-10-CM

## 2018-06-16 LAB — TESTOSTERONE: TESTOSTERONE: 9.38 ng/dL — AB (ref 15.00–40.00)

## 2018-06-16 NOTE — Patient Instructions (Signed)
Good to see you today  Please stop at desk to see one of the referrals coordinators  I'll be in touch as I get your results

## 2018-06-16 NOTE — Progress Notes (Signed)
Subjective:    Patient ID: Yesenia Beasley, female    DOB: 30-Mar-1986, 32 y.o.   MRN: [id number]  HPI This is a 32 yo female who presents today for follow up of ER visit 4 days ago. Has noticed increase in temperature as day goes on. Starts at her normal then goes up about 2 degrees throughout the day. Eyes hurt and feel heavy. Feels constipated and bloated. Neck feels different, ? Swollen lymph nodes.  Concerned for PCOS. Some increased hair growth on nipples. Started spotting a couple of days ago.  Increased anxiety and panic with feeling bad.  Hot, fatigue, increased anxiety, bloating, frequent headaches- no relief with fioricet.  Constant headahce, has every day, not always all day. No relief with tylenol and water. Headache on right side and at back. Feels sharper than her usual headaches, moves around. Negative head CT in ER.  Noticed some fine tremors intermittently.  Feels like something under arms, can't palpate any abnormality.  She admits to history of anxiety, her mother died from aplastic anemia which was diagnosed late.   Summary of symptoms- Low grade fevers later in day Fatigue Bloating Constipation Daily headache Increased anxiety  Work up in ER- Negative head CT w/o contrast Unremarkable CMET, CBC, TSH (still normal, but up from 1.48 to 4.132 in two months), free T4, free T3, urine, urine preg, quantitative hCG,  Mildly elevated prolactin     Past Medical History:  Diagnosis Date  . Anxiety   . Genetic testing 11/09/2017   Multi-Cancer panel (83 genes) @ [facility] - No pathogenic mutations detected   Past Surgical History:  Procedure Laterality Date  . COLONOSCOPY WITH ESOPHAGOGASTRODUODENOSCOPY (EGD)    . COLONOSCOPY WITH PROPOFOL N/A 10/05/2017   Procedure: COLONOSCOPY WITH PROPOFOL;  Surgeon: Jonathon Bellows, MD;  Location: [facility] ENDOSCOPY;  Service: Gastroenterology;  Laterality: N/A;  . KNEE SURGERY Bilateral 2002,2003,2014   Family History  Problem Relation Age  of Onset  . Leukemia Mother        CMML; dx 29s; deceased 47  . Heart disease Father   . Colon polyps Father        initially in 6s; #/type unknown  . Wilm's tumor Sister        deceased at 2  . Brain cancer Maternal Grandfather        astrocytoma; deceased 35  . Heart attack Paternal Grandfather        deceased at 24  . Thyroid cancer Maternal Uncle        dx 48s  . Non-Hodgkin's lymphoma Maternal Aunt    Social History   Tobacco Use  . Smoking status: Current Every Day Smoker  . Smokeless tobacco: Never Used  Substance Use Topics  . 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 Drug use: No     Review of Systems Per HPI    Objective:   Physical Exam  Constitutional: She is oriented to person, place, and time. She appears well-developed and well-nourished. No distress.  HENT:  Head: Normocephalic and atraumatic.  Eyes: Conjunctivae are normal.  Neck: Normal range of motion. Neck supple. Thyromegaly (mild generalized fullness, no discreet nodules) present.  Cardiovascular: Normal rate, regular rhythm and normal heart sounds.  Pulmonary/Chest: Effort normal and breath sounds normal.  Neurological: She is alert and oriented to person, place, and time.  Skin: Skin is warm and dry. She is not diaphoretic.  Psychiatric: She has a normal mood and affect. Her behavior is  normal. Judgment and thought content normal.  Vitals reviewed.        BP 114/72 (BP Location: Left Arm, Patient Position: Sitting, Cuff Size: Normal)   Pulse 80   Temp 98.3 F (36.8 C) (Oral)   Ht 5\' 6"  (1.676 m)   Wt 250 lb (113.4 kg)   LMP 05/24/2018 Comment: neg preg test  SpO2 96%   BMI 40.35 kg/m  Wt Readings from Last 3 Encounters:  06/16/18 250 lb (113.4 kg)  06/12/18 244 lb 11.4 oz (111 kg)  03/26/18 245 lb (111.1 kg)    Assessment & Plan:  Patient has multiple vague symptoms, reviewed recent results with her and discussed additional work up.   1. Abdominal bloating - US PELVIC  COMPLETE WITH TRANSVAGINAL; Future  2. Constipation, unspecified constipation type - US PELVIC COMPLETE WITH TRANSVAGINAL; Future  3. Neck fullness - US thyroid, future  4. Abnormal uterine bleeding - ?  PCOS - US PELVIC COMPLETE WITH TRANSVAGINAL; Future - Testosterone  5. Encounter for imaging to assess thyroid enlargement - US THYROID; Future   Clarene Reamer, FNP-BC  [facility]  06/20/2018 8:21 PM

## 2018-06-18 ENCOUNTER — Ambulatory Visit: Payer: 59 | Admitting: Family Medicine

## 2018-06-20 ENCOUNTER — Encounter: Payer: Self-pay | Admitting: Family Medicine

## 2018-06-21 ENCOUNTER — Ambulatory Visit
Admission: RE | Admit: 2018-06-21 | Discharge: 2018-06-21 | Disposition: A | Discharge status: Home / Self Care | Payer: 59 | Source: Ambulatory Visit | Attending: Family Medicine | Admitting: Family Medicine

## 2018-06-21 DIAGNOSIS — R221 Localized swelling, mass and lump, neck: Secondary | ICD-10-CM

## 2018-06-21 DIAGNOSIS — K5900 Constipation, unspecified: Secondary | ICD-10-CM

## 2018-06-21 DIAGNOSIS — Z0189 Encounter for other specified special examinations: Secondary | ICD-10-CM

## 2018-06-21 DIAGNOSIS — R140 Abdominal distension (gaseous): Secondary | ICD-10-CM

## 2018-06-21 DIAGNOSIS — N939 Abnormal uterine and vaginal bleeding, unspecified: Secondary | ICD-10-CM

## 2018-06-23 ENCOUNTER — Encounter: Payer: Self-pay | Admitting: Family Medicine

## 2018-06-28 ENCOUNTER — Other Ambulatory Visit: Payer: Self-pay | Admitting: Family Medicine

## 2018-06-28 DIAGNOSIS — F410 Panic disorder [episodic paroxysmal anxiety] without agoraphobia: Secondary | ICD-10-CM

## 2018-06-30 NOTE — Telephone Encounter (Signed)
Last OV 06/16/18 Last filled 05/03/18 #45 refills 1

## 2018-07-05 ENCOUNTER — Other Ambulatory Visit: Payer: Self-pay | Admitting: Family Medicine

## 2018-07-05 DIAGNOSIS — E7841 Elevated Lipoprotein(a): Secondary | ICD-10-CM

## 2018-07-07 ENCOUNTER — Other Ambulatory Visit (INDEPENDENT_AMBULATORY_CARE_PROVIDER_SITE_OTHER): Payer: 59

## 2018-07-07 DIAGNOSIS — E7841 Elevated Lipoprotein(a): Secondary | ICD-10-CM | POA: Diagnosis not present

## 2018-07-07 LAB — LIPID PANEL
Cholesterol: 179 mg/dL (ref 0–200)
HDL: 31.4 mg/dL — ABNORMAL LOW (ref >39.00)
LDL Cholesterol: 129 mg/dL — ABNORMAL HIGH (ref 0–99)
NONHDL: 147.43
Total CHOL/HDL Ratio: 6
Triglycerides: 94 mg/dL (ref 0.0–149.0)
VLDL: 18.8 mg/dL (ref 0.0–40.0)

## 2018-07-14 ENCOUNTER — Ambulatory Visit (INDEPENDENT_AMBULATORY_CARE_PROVIDER_SITE_OTHER): Payer: 59 | Admitting: Family Medicine

## 2018-07-14 ENCOUNTER — Encounter: Payer: Self-pay | Admitting: Family Medicine

## 2018-07-14 ENCOUNTER — Other Ambulatory Visit (HOSPITAL_COMMUNITY)
Admission: RE | Admit: 2018-07-14 | Discharge: 2018-07-14 | Disposition: A | Discharge status: Home / Self Care | Payer: 59 | Source: Ambulatory Visit | Attending: Family Medicine | Admitting: Family Medicine

## 2018-07-14 VITALS — BP 132/82 | HR 85 | Ht 65.5 in | Wt 250.0 lb

## 2018-07-14 DIAGNOSIS — R7989 Other specified abnormal findings of blood chemistry: Secondary | ICD-10-CM

## 2018-07-14 DIAGNOSIS — Z124 Encounter for screening for malignant neoplasm of cervix: Secondary | ICD-10-CM | POA: Insufficient documentation

## 2018-07-14 DIAGNOSIS — F339 Major depressive disorder, recurrent, unspecified: Secondary | ICD-10-CM

## 2018-07-14 DIAGNOSIS — Z0000 Encounter for general adult medical examination without abnormal findings: Secondary | ICD-10-CM | POA: Diagnosis not present

## 2018-07-14 DIAGNOSIS — Z23 Encounter for immunization: Secondary | ICD-10-CM

## 2018-07-14 DIAGNOSIS — F419 Anxiety disorder, unspecified: Secondary | ICD-10-CM

## 2018-07-14 MED ORDER — SERTRALINE HCL 50 MG PO TABS: 50.0000 mg | ORAL_TABLET | Freq: Every day | ORAL | 1 refills | Status: DC

## 2018-07-14 NOTE — Patient Instructions (Addendum)
Lab only visit in Jan for thyroid  Follow up in 3 months for checking lipid panel, etc  Take 1/2 of 50 mg sertraline with your 100 mg for 1 week then can increase to 1 tablet  Keeping You Healthy  Get These Tests 1. Blood Pressure- Have your blood pressure checked once a year by your health care provider.  Normal blood pressure is 120/80. 2. Weight- Have your body mass index (BMI) calculated to screen for obesity.  BMI is measure of body fat based on height and weight.  You can also calculate your own BMI at GravelBags.it. 3. Cholesterol- Have your cholesterol checked every 5 years starting at age 34 then yearly starting at age 34. 37. Chlamydia, HIV, and other sexually transmitted diseases- Get screened every year until age 34, then within three months of each new sexual provider. 5. Pap Test - Every 1-5 years; discuss with your health care provider. 6. Mammogram- Every 1-2 years starting at age 34--50  Take these medicines  Calcium with Vitamin D-Your body needs 1200 mg of Calcium each day and [phone] IU of Vitamin D daily.  Your body can only absorb 500 mg of Calcium at a time so Calcium must be taken in 2 or 3 divided doses throughout the day.  Multivitamin with folic acid- Once daily if it is possible for you to become pregnant.  Get these Immunizations  Gardasil-Series of three doses; prevents HPV related illness such as genital warts and cervical cancer.  Menactra-Single dose; prevents meningitis.  Tetanus shot- Every 10 years.  Flu shot-Every year.  Take these steps 1. Do not smoke-Your healthcare provider can help you quit.  For tips on how to quit go to www.smokefree.gov or call 1-800 QUITNOW. 2. Be physically active- Exercise 5 days a week for at least 30 minutes.  If you are not already physically active, start slow and gradually work up to 30 minutes of moderate physical activity.  Examples of moderate activity include walking briskly, dancing, swimming,  bicycling, etc. 3. Breast Cancer- A self breast exam every month is important for early detection of breast cancer.  For more information and instruction on self breast exams, ask your healthcare provider or https://www.patel.info/. 4. Eat a healthy diet- Eat a variety of healthy foods such as fruits, vegetables, whole grains, low fat milk, low fat cheeses, yogurt, lean meats, poultry and fish, beans, nuts, tofu, etc.  For more information go to www. Thenutritionsource.org 5. Drink alcohol in moderation- Limit alcohol intake to one drink or less per day. Never drink and drive. 6. Depression- Your emotional health is as important as your physical health.  If you're feeling down or losing interest in things you normally enjoy please talk to your healthcare provider about being screened for depression. 7. Dental visit- Brush and floss your teeth twice daily; visit your dentist twice a year. 8. Eye doctor- Get an eye exam at least every 2 years. 9. Helmet use- Always wear a helmet when riding a bicycle, motorcycle, rollerblading or skateboarding. 73. Safe sex- If you may be exposed to sexually transmitted infections, use a condom. 11. Seat belts- Seat belts can save your live; always wear one. 12. Smoke/Carbon Monoxide detectors- These detectors need to be installed on the appropriate level of your home. Replace batteries at least once a year. 13. Skin cancer- When out in the sun please cover up and use sunscreen 15 SPF or higher. 14. Violence- If anyone is threatening or hurting you, please tell your healthcare  provider.

## 2018-07-14 NOTE — Progress Notes (Signed)
Subjective:    Patient ID: Yesenia Beasley, female    DOB: 05/08/1986, 32 y.o.   MRN: [id number]  HPI This is a 32 yo female who presents today for CPE.   Last CPE- last year Pap- several years Flu- today Eye- within last 2 years Dental- regular Exercise- walks her dogs   Past Medical History:  Diagnosis Date  . Anxiety   . Genetic testing 11/09/2017   Multi-Cancer panel (83 genes) @ [facility] - No pathogenic mutations detected   Past Surgical History:  Procedure Laterality Date  . COLONOSCOPY WITH ESOPHAGOGASTRODUODENOSCOPY (EGD)    . COLONOSCOPY WITH PROPOFOL N/A 10/05/2017   Procedure: COLONOSCOPY WITH PROPOFOL;  Surgeon: Jonathon Bellows, MD;  Location: [facility] ENDOSCOPY;  Service: Gastroenterology;  Laterality: N/A;  . KNEE SURGERY Bilateral 2002,2003,2014   Family History  Problem Relation Age of Onset  . Leukemia Mother        CMML; dx 61s; deceased 97  . Heart disease Father   . Colon polyps Father        initially in 12s; #/type unknown  . Wilm's tumor Sister        deceased at 2  . Brain cancer Maternal Grandfather        astrocytoma; deceased 70  . Heart attack Paternal Grandfather        deceased at 69  . Thyroid cancer Maternal Uncle        dx 73s  . Non-Hodgkin's lymphoma Maternal Aunt    Social History   Tobacco Use  . Smoking status: Current Every Day Smoker  . Smokeless tobacco: Never Used  Substance Use Topics  . 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 Drug use: No     Review of Systems  Constitutional: Positive for fever.       Continues to have feeling of facial flushing as day goes on. Temporal temperature measuring about 1 degree above oral.   HENT: Negative.   Eyes: Negative.   Respiratory: Negative.   Cardiovascular: Negative.   Gastrointestinal: Negative.   Endocrine: Negative.   Genitourinary: Negative.   Musculoskeletal: Negative.   Skin: Negative.   Hematological: Negative.   Psychiatric/Behavioral: Positive for sleep  disturbance (difficulty staying asleep). The patient is nervous/anxious.        Objective:   Physical Exam Physical Exam  Constitutional: She is oriented to person, place, and time. She appears well-developed and well-nourished. No distress.  HENT:  Head: Normocephalic and atraumatic.  Right Ear: External ear normal.  Left Ear: External ear normal.  Nose: Nose normal.  Mouth/Throat: Oropharynx is clear and moist. No oropharyngeal exudate.  Eyes: Conjunctivae are normal. Pupils are equal, round, and reactive to light.  Neck: Normal range of motion. Neck supple. No JVD present. No thyromegaly present.  Cardiovascular: Normal rate, regular rhythm, normal heart sounds and intact distal pulses.   Pulmonary/Chest: Effort normal and breath sounds normal. Right breast exhibits no inverted nipple, no mass, no nipple discharge, no skin change and no tenderness. Left breast exhibits no inverted nipple, no mass, no nipple discharge, no skin change and no tenderness. Breasts are symmetrical.  Abdominal: Soft. Bowel sounds are normal. She exhibits no distension and no mass. There is no tenderness. There is no rebound and no guarding.  Genitourinary: Vagina normal. Pelvic exam was performed with patient supine. There is no rash, tenderness, lesion or injury on the right labia. There is no rash, tenderness, lesion or injury on the left labia. Cervix  exhibits no motion tenderness and no discharge. Very tightly closed OS, usable to insert brush. No vaginal discharge found.  Musculoskeletal: Normal range of motion. She exhibits no edema or tenderness.  Lymphadenopathy:    She has no cervical adenopathy.  Neurological: She is alert and oriented to person, place, and time. She has normal reflexes.  Skin: Skin is warm and dry. She is not diaphoretic.  Psychiatric: She has a normal mood and affect. Her behavior is normal. Judgment and thought content normal.  Vitals reviewed.     BP 132/82 (BP Location:  Right Arm, Patient Position: Sitting, Cuff Size: Normal)   Pulse 85   Ht 5' 5.5" (1.664 m)   Wt 250 lb (113.4 kg)   LMP 06/17/2018   SpO2 97%   BMI 40.97 kg/m  Wt Readings from Last 3 Encounters:  07/14/18 250 lb (113.4 kg)  06/16/18 250 lb (113.4 kg)  06/12/18 244 lb 11.4 oz (111 kg)   Depression screen [facility] 2/9 07/14/2018 01/06/2018 06/24/2017  Decreased Interest 0 1 1  Down, Depressed, Hopeless 0 1 1  PHQ - 2 Score 0 2 2  Altered sleeping - 3 2  Tired, decreased energy - 2 2  Change in appetite - 2 2  Feeling bad or failure about yourself  - 0 1  Trouble concentrating - 1 2  Moving slowly or fidgety/restless - 1 0  Suicidal thoughts - 0 0  PHQ-9 Score - 11 11  Difficult doing work/chores - Somewhat difficult -       Assessment & Plan:  1. Annual physical exam - Discussed and encouraged healthy lifestyle choices- adequate sleep, regular exercise, stress management and healthy food choices.   2. Need for influenza vaccination - Flu Vaccine QUAD 36+ mos IM  3. Screening for cervical cancer - PAP []  4. Depression, recurrent ([place]) - discussed her physical symptoms and negative work up, she admits that may be related to anxiety/depression, will try increased dose of sertraline - follow up in 3 wees - sertraline (ZOLOFT) 50 MG tablet; Take 1 tablet (50 mg total) by mouth daily. With 100 mg.  Dispense: 90 tablet; Refill: 1  5. Abnormal TSH - TSH; Future  6. Anxiety - sertraline (ZOLOFT) 50 MG tablet; Take 1 tablet (50 mg total) by mouth daily. With 100 mg.  Dispense: 90 tablet; Refill: [place], FNP-BC  [facility]  07/19/2018 8:03 AM

## 2018-07-16 LAB — CYTOLOGY - PAP
Bacterial vaginitis: NEGATIVE
CANDIDA VAGINITIS: NEGATIVE
CHLAMYDIA, DNA PROBE: NEGATIVE
Diagnosis: NEGATIVE
HPV 16/18/45 GENOTYPING: NEGATIVE
HPV: DETECTED — AB
NEISSERIA GONORRHEA: NEGATIVE
Trichomonas: NEGATIVE

## 2018-07-19 ENCOUNTER — Encounter: Payer: Self-pay | Admitting: Family Medicine

## 2018-08-06 ENCOUNTER — Other Ambulatory Visit: Payer: Self-pay | Admitting: Family Medicine

## 2018-08-06 DIAGNOSIS — F410 Panic disorder [episodic paroxysmal anxiety] without agoraphobia: Secondary | ICD-10-CM

## 2018-08-07 ENCOUNTER — Other Ambulatory Visit: Payer: Self-pay | Admitting: Family Medicine

## 2018-08-09 NOTE — Telephone Encounter (Signed)
Please Advise  Medication is on backorder.

## 2018-09-10 ENCOUNTER — Other Ambulatory Visit: Payer: Self-pay | Admitting: Family Medicine

## 2018-09-10 DIAGNOSIS — F410 Panic disorder [episodic paroxysmal anxiety] without agoraphobia: Secondary | ICD-10-CM

## 2018-09-10 NOTE — Telephone Encounter (Signed)
Last filled 06-30-18 #45 Last OV/CPE 07-14-18 No Future OV [facility]

## 2018-09-14 ENCOUNTER — Emergency Department
Admission: EM | Admit: 2018-09-14 | Discharge: 2018-09-15 | Disposition: A | Discharge status: Home / Self Care | Payer: 59 | Attending: Emergency Medicine | Admitting: Emergency Medicine

## 2018-09-14 ENCOUNTER — Other Ambulatory Visit: Payer: Self-pay

## 2018-09-14 DIAGNOSIS — R1031 Right lower quadrant pain: Secondary | ICD-10-CM | POA: Diagnosis present

## 2018-09-14 DIAGNOSIS — Z79899 Other long term (current) drug therapy: Secondary | ICD-10-CM | POA: Insufficient documentation

## 2018-09-14 DIAGNOSIS — N83209 Unspecified ovarian cyst, unspecified side: Secondary | ICD-10-CM | POA: Diagnosis not present

## 2018-09-14 DIAGNOSIS — F17200 Nicotine dependence, unspecified, uncomplicated: Secondary | ICD-10-CM | POA: Insufficient documentation

## 2018-09-14 DIAGNOSIS — R112 Nausea with vomiting, unspecified: Secondary | ICD-10-CM | POA: Insufficient documentation

## 2018-09-14 LAB — URINALYSIS, COMPLETE (UACMP) WITH MICROSCOPIC
BACTERIA UA: NONE SEEN
BILIRUBIN URINE: NEGATIVE
Glucose, UA: NEGATIVE mg/dL
HGB URINE DIPSTICK: NEGATIVE
KETONES UR: NEGATIVE mg/dL
LEUKOCYTES UA: NEGATIVE
NITRITE: NEGATIVE
PROTEIN: NEGATIVE mg/dL
Specific Gravity, Urine: 1.017 (ref 1.005–1.030)
pH: 6 (ref 5.0–8.0)

## 2018-09-14 LAB — COMPREHENSIVE METABOLIC PANEL
ALK PHOS: 76 U/L (ref 38–126)
ALT: 16 U/L (ref 0–44)
ANION GAP: 7 (ref 5–15)
AST: 12 U/L — AB (ref 15–41)
Albumin: 4 g/dL (ref 3.5–5.0)
BILIRUBIN TOTAL: 0.5 mg/dL (ref 0.3–1.2)
BUN: 10 mg/dL (ref 6–20)
CO2: 23 mmol/L (ref 22–32)
Calcium: 9.5 mg/dL (ref 8.9–10.3)
Chloride: 108 mmol/L (ref 98–111)
Creatinine, Ser: 0.62 mg/dL (ref 0.44–1.00)
GFR calc Af Amer: >60 mL/min (ref >60)
GFR calc non Af Amer: >60 mL/min (ref >60)
Glucose, Bld: 94 mg/dL (ref 70–99)
POTASSIUM: 4 mmol/L (ref 3.5–5.1)
SODIUM: 138 mmol/L (ref 135–145)
Total Protein: 7 g/dL (ref 6.5–8.1)

## 2018-09-14 LAB — LIPASE, BLOOD: Lipase: 23 U/L (ref 11–51)

## 2018-09-14 LAB — CBC
HEMATOCRIT: 43 % (ref 36.0–46.0)
HEMOGLOBIN: 14 g/dL (ref 12.0–15.0)
MCH: 28.9 pg (ref 26.0–34.0)
MCHC: 32.6 g/dL (ref 30.0–36.0)
MCV: 88.7 fL (ref 80.0–100.0)
Platelets: 189 10*3/uL (ref 150–400)
RBC: 4.85 MIL/uL (ref 3.87–5.11)
RDW: 14.1 % (ref 11.5–15.5)
WBC: 11.9 10*3/uL — ABNORMAL HIGH (ref 4.0–10.5)
nRBC: 0 % (ref 0.0–0.2)

## 2018-09-14 NOTE — ED Triage Notes (Signed)
PT in with co generalized abd pain that started yesterday. Denies any vomiting or diarrhea, no dysuria.

## 2018-09-14 NOTE — ED Provider Notes (Signed)
[facility] Emergency Department Provider Note  ____________________________________________   First MD Initiated Contact with Patient 09/14/18 2344     (approximate)  I have reviewed the triage vital signs and the nursing notes.   HISTORY  Chief Complaint Abdominal Pain   HPI Yesenia Beasley is a 33 y.o. female presents to the emergency department with generalized abdominal discomfort that began yesterday that has now radiated down towards her right greater than left lower quadrants.  It is associated with nausea but no vomiting.  No dysuria frequency or hesitancy.  No back pain.  She has no history of abdominal surgeries.  Symptoms came on gradually have been slowly progressive are now constant and moderate to severe.  They are somewhat worse with movement.  Her last menstrual period was about 2 weeks ago.  She also reports a longstanding history of anxiety.    Past Medical History:  Diagnosis Date  . Anxiety   . Genetic testing 11/09/2017   Multi-Cancer panel (83 genes) @ [facility] - No pathogenic mutations detected    Patient Active Problem List   Diagnosis Date Noted  . Gastroesophageal reflux disease 01/06/2018  . Genetic testing 11/09/2017  . Dysmenorrhea 06/25/2017  . Panic disorder 06/25/2017  . Pseudotumor cerebri 06/25/2017  . History of colon polyps 06/25/2017  . Depression, recurrent ([place]) 06/25/2017  . Anxiety 06/15/2017    Past Surgical History:  Procedure Laterality Date  . COLONOSCOPY WITH ESOPHAGOGASTRODUODENOSCOPY (EGD)    . COLONOSCOPY WITH PROPOFOL N/A 10/05/2017   Procedure: COLONOSCOPY WITH PROPOFOL;  Surgeon: Jonathon Bellows, MD;  Location: [facility] ENDOSCOPY;  Service: Gastroenterology;  Laterality: N/A;  . KNEE SURGERY Bilateral 2002,2003,2014    Prior to Admission medications   Medication Sig Start Date End Date Taking? Authorizing Provider  albuterol (PROVENTIL HFA;VENTOLIN HFA) 108 (90 Base) MCG/ACT inhaler TAKE 2 PUFFS BY MOUTH  EVERY 6 HOURS AS NEEDED FOR WHEEZE OR SHORTNESS OF BREATH 08/09/18   Elby Beck, FNP  ALPRAZolam Duanne Moron) 0.5 MG tablet TAKE 1-2 TABLETS (0.5-1 MG TOTAL) BY MOUTH 2 (TWO) TIMES DAILY AS NEEDED FOR ANXIETY. 09/10/18   Elby Beck, FNP  butalbital-acetaminophen-caffeine (FIORICET, ESGIC) 50-325-40 MG tablet Take 1 tablet by mouth every 6 (six) hours as needed for headache. 06/12/18 06/12/19  Cuthriell, Charline Bills, PA-C  cetirizine (ZYRTEC) 5 MG tablet Take 1 tablet (5 mg total) by mouth daily. 05/26/18   Loura Halt A, NP  cyclobenzaprine (FLEXERIL) 5 MG tablet Take 5 mg by mouth 3 (three) times daily as needed for muscle spasms.    [provider]  fluticasone (FLONASE) 50 MCG/ACT nasal spray Place 1 spray into both nostrils daily. 05/26/18   Loura Halt A, NP  HYDROcodone-acetaminophen (NORCO) 5-325 MG tablet Take 1 tablet by mouth every 6 (six) hours as needed for up to 7 doses for severe pain. 09/15/18   Darel Hong, MD  ibuprofen (ADVIL,MOTRIN) 600 MG tablet Take 1 tablet (600 mg total) by mouth every 8 (eight) hours as needed. 09/15/18   Darel Hong, MD  omeprazole (PRILOSEC) 40 MG capsule Take 1 capsule (40 mg total) by mouth daily. 01/05/18 05/26/18  Jonathon Bellows, MD  omeprazole (PRILOSEC) 40 MG capsule Take by mouth daily. 07/01/18   [provider]  ondansetron (ZOFRAN ODT) 4 MG disintegrating tablet Take 1 tablet (4 mg total) by mouth every 8 (eight) hours as needed for nausea or vomiting. 09/15/18   Darel Hong, MD  sertraline (ZOLOFT) 100 MG tablet Take 1 tablet (100  mg total) by mouth daily. 01/06/18   Elby Beck, FNP  sertraline (ZOLOFT) 50 MG tablet Take 1 tablet (50 mg total) by mouth daily. With 100 mg. 07/14/18   Elby Beck, FNP    Allergies Patient has no known allergies.  Family History  Problem Relation Age of Onset  . Leukemia Mother        CMML; dx 37s; deceased 66  . Heart disease Father   . Colon polyps Father        initially  in 17s; #/type unknown  . Wilm's tumor Sister        deceased at 2  . Brain cancer Maternal Grandfather        astrocytoma; deceased 9  . Heart attack Paternal Grandfather        deceased at 59  . Thyroid cancer Maternal Uncle        dx 36s  . Non-Hodgkin's lymphoma Maternal Aunt     Social History Social History   Tobacco Use  . Smoking status: Current Every Day Smoker  . Smokeless tobacco: Never Used  Substance Use Topics  . 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 Drug use: No    Review of Systems Constitutional: No fever/chills Eyes: No visual changes. ENT: No sore throat. Cardiovascular: Denies chest pain. Respiratory: Denies shortness of breath. Gastrointestinal: Positive for abdominal pain.  Positive for nausea, no vomiting.  No diarrhea.  No constipation. Genitourinary: Negative for dysuria. Musculoskeletal: Negative for back pain. Skin: Negative for rash. Neurological: Negative for headaches, focal weakness or numbness.   ____________________________________________   PHYSICAL EXAM:  VITAL SIGNS: ED Triage Vitals  Enc Vitals Group     BP 09/14/18 2123 (!) 156/91     Pulse Rate 09/14/18 2121 69     Resp 09/14/18 2121 20     Temp 09/14/18 2121 98.7 F (37.1 C)     Temp Source 09/14/18 2121 Oral     SpO2 09/14/18 2121 99 %     Weight 09/14/18 2122 250 lb (113.4 kg)     Height 09/14/18 2122 5\' 5"  (1.651 m)     Head Circumference --      Peak Flow --      Pain Score 09/14/18 2122 7     Pain Loc --      Pain Edu? --      Excl. in [place]? --     Constitutional: Alert and oriented x4 appears obviously uncomfortable curled on her side holding her lower abdomen Eyes: PERRL EOMI. Head: Atraumatic. Nose: No congestion/rhinnorhea. Mouth/Throat: No trismus Neck: No stridor.   Cardiovascular: Normal rate, regular rhythm. Grossly normal heart sounds.  Good peripheral circulation. Respiratory: Normal respiratory effort.  No retractions. Lungs CTAB and  moving good air Gastrointestinal: Soft abdomen quite tender in her right greater than left lower quadrant with rebound and guarding.  Negative Rovsing's.  She does seem to be tender over McBurney's Musculoskeletal: No lower extremity edema   Neurologic:  Normal speech and language. No gross focal neurologic deficits are appreciated. Skin:  Skin is warm, dry and intact. No rash noted. Psychiatric: Mood and affect are normal. Speech and behavior are normal.    ____________________________________________   DIFFERENTIAL includes but not limited to  Appendicitis, pelvic inflammatory disease, pyelonephritis, nephrolithiasis ____________________________________________   LABS (all labs ordered are listed, but only abnormal results are displayed)  Labs Reviewed  CBC - Abnormal; Notable for the following components:  Result Value   WBC 11.9 (*)    All other components within normal limits  COMPREHENSIVE METABOLIC PANEL - Abnormal; Notable for the following components:   AST 12 (*)    All other components within normal limits  URINALYSIS, COMPLETE (UACMP) WITH MICROSCOPIC - Abnormal; Notable for the following components:   Color, Urine YELLOW (*)    APPearance CLEAR (*)    All other components within normal limits  LIPASE, BLOOD  POCT PREGNANCY, URINE    Lab work reviewed by me with slightly elevated white count which is nonspecific and very well could be secondary to pain __________________________________________  EKG   ____________________________________________  RADIOLOGY  CT abdomen pelvis reviewed by me shows a normal appendix and an involuting hemorrhagic cyst on the right ____________________________________________   PROCEDURES  Procedure(s) performed: no  Procedures  Critical Care performed: no  ____________________________________________   INITIAL IMPRESSION / ASSESSMENT AND PLAN / ED COURSE  Pertinent labs & imaging results that were available  during my care of the patient were reviewed by me and considered in my medical decision making (see chart for details).   As part of my medical decision making, I reviewed the following data within the [place] History obtained from family if available, nursing notes, old chart and ekg, as well as notes from prior ED visits.  The patient comes to the emergency department quite uncomfortable appearing with abdominal pain nausea and vomiting.  The pain is been migratory and is now in her right lower quadrant along with significant tenderness the right lower quadrant.  And making her n.p.o. we will give her a liter of IV fluids along with 75 mcg of IV fentanyl and 2.5 mg of IV Haldol for pain and nausea.  Her lab work is reassuring but I do think she will require a CT scan to fully evaluate for possible appendicitis.  Fortunately the patient's CT scan is reassuring.  Prior to the results the patient began to hyperventilate and have a panic attack so I gave her 1 mg of IV Ativan along with 15 mg of IV Toradol with near complete resolution of her symptoms.  I reviewed the patient's lab work and CT scans with her in person.  She does get her Pap smear by a primary care physician and has not seen an OB gynecologist in "a long time".  I do think it is a good idea for her to see an OB gynecologist at least once to evaluate this ovarian cyst.  Strict return precautions have been given and I will discharge her home with a short course of hydrocodone ibuprofen and Zofran.      ____________________________________________   FINAL CLINICAL IMPRESSION(S) / ED DIAGNOSES  Final diagnoses:  Hemorrhagic ovarian cyst      NEW MEDICATIONS STARTED DURING THIS VISIT:  Discharge Medication List as of 09/15/2018  2:16 AM    START taking these medications   Details  HYDROcodone-acetaminophen (NORCO) 5-325 MG tablet Take 1 tablet by mouth every 6 (six) hours as needed for up to 7 doses for severe  pain., Starting Wed 09/15/2018, Print    ibuprofen (ADVIL,MOTRIN) 600 MG tablet Take 1 tablet (600 mg total) by mouth every 8 (eight) hours as needed., Starting Wed 09/15/2018, Print    ondansetron (ZOFRAN ODT) 4 MG disintegrating tablet Take 1 tablet (4 mg total) by mouth every 8 (eight) hours as needed for nausea or vomiting., Starting Wed 09/15/2018, Print  Note:  This document was prepared using Dragon voice recognition software and may include unintentional dictation errors.    Darel Hong, MD 09/16/18 2250

## 2018-09-15 ENCOUNTER — Emergency Department: Payer: 59

## 2018-09-15 LAB — POCT PREGNANCY, URINE: Preg Test, Ur: NEGATIVE

## 2018-09-15 IMAGING — CT CT ABD-PELV W/ CM
2 of 4 series · 17 of 46 positions shown, 19 images · IV contrast (APPLIED)
Comparison: None.

CLINICAL DATA: Generalized pain starting yesterday without vomiting
or diarrhea. No dysuria.

EXAM:
CT ABDOMEN AND PELVIS WITH CONTRAST
TECHNIQUE: Multidetector CT imaging of the abdomen and pelvis was performed
using the standard protocol following bolus administration of
intravenous contrast.
CONTRAST:  100mL [66] IOPAMIDOL ([66]) INJECTION 61%

[Series 2: axial st · axial · 0.87mm/px · z∈[-841,-421]mm · 14 of 94 slices shown, 16 images]
[im 5/94  soft-tissue]
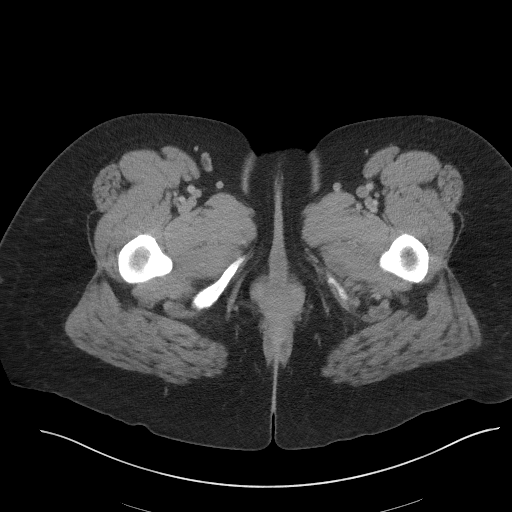
[im 5/94  bone]
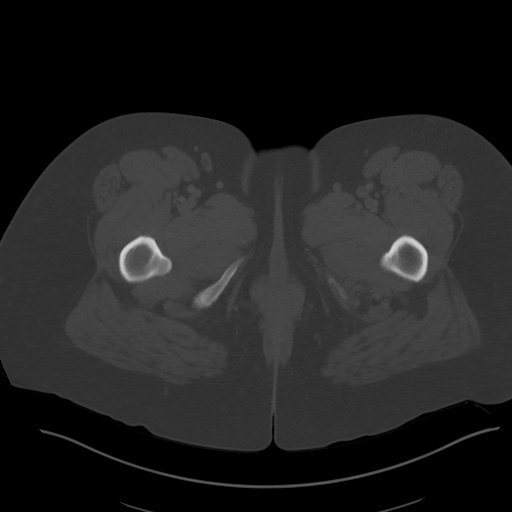
[im 13/94  soft-tissue]
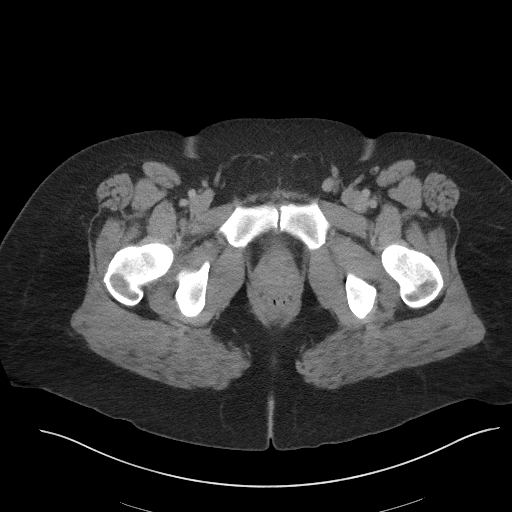
[im 17/94  soft-tissue]
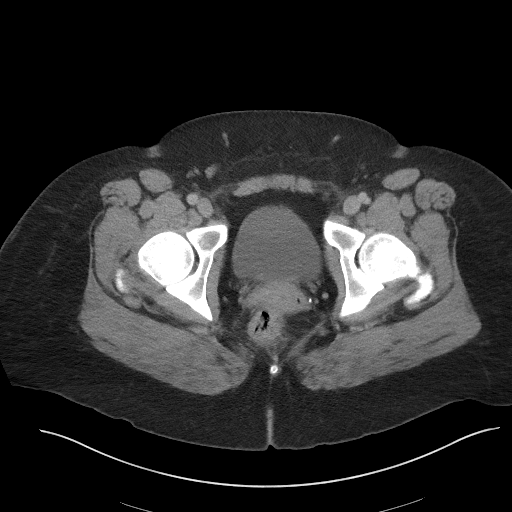
[im 25/94  soft-tissue]
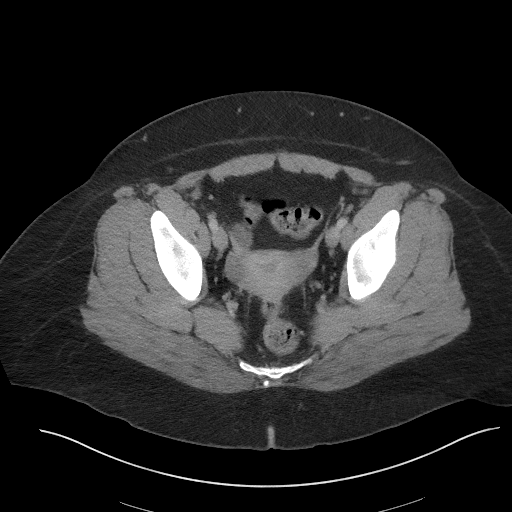
[im 33/94  soft-tissue]
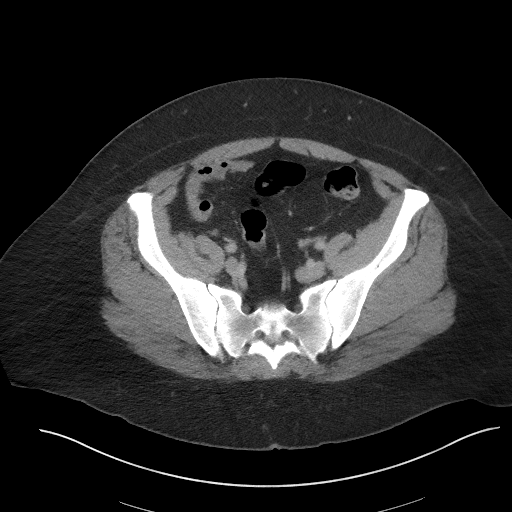
[im 37/94  soft-tissue]
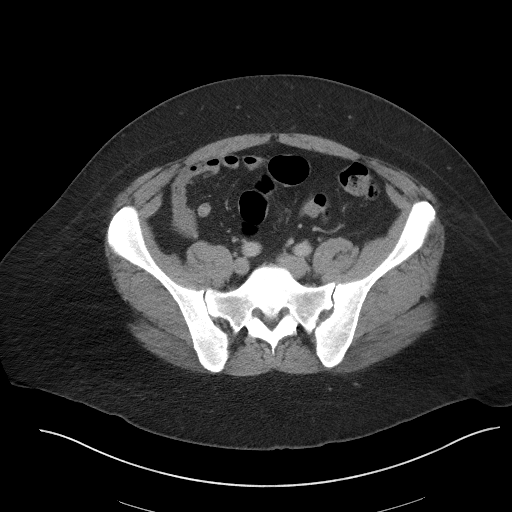
[im 45/94  soft-tissue]
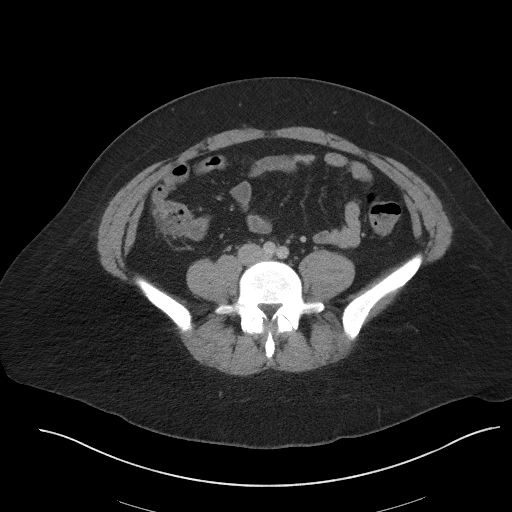
[im 49/94  soft-tissue]
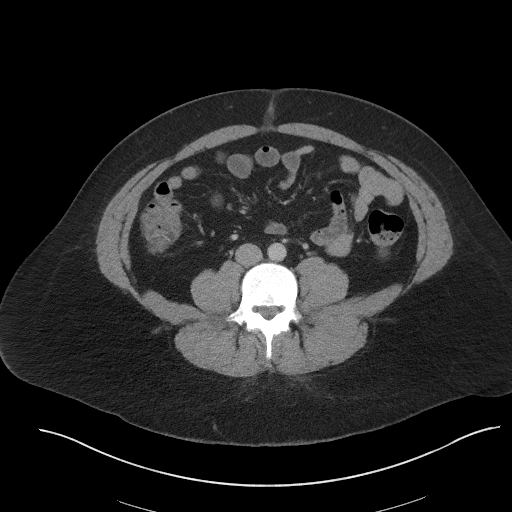
[im 57/94  soft-tissue]
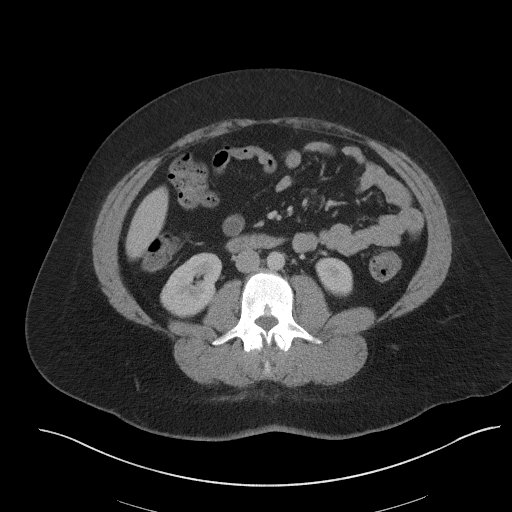
[im 57/94  bone]
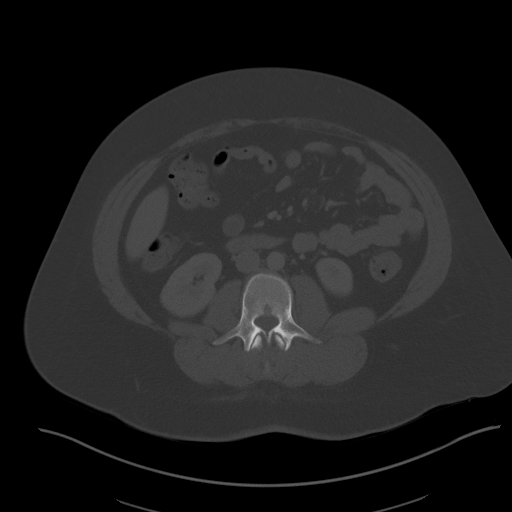
[im 61/94  soft-tissue]
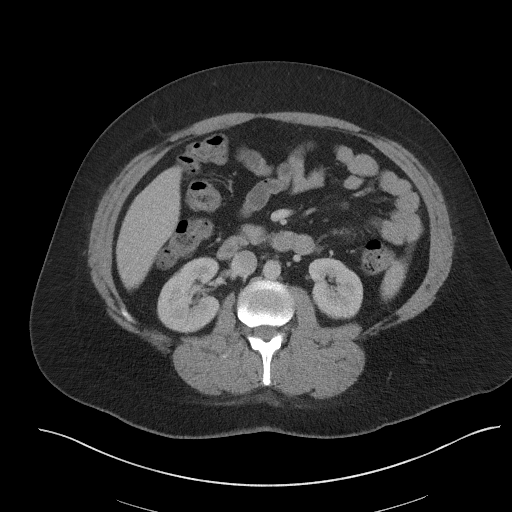
[im 69/94  soft-tissue]
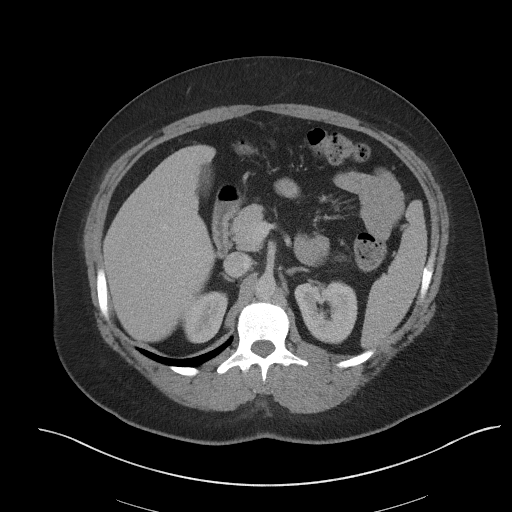
[im 77/94  soft-tissue]
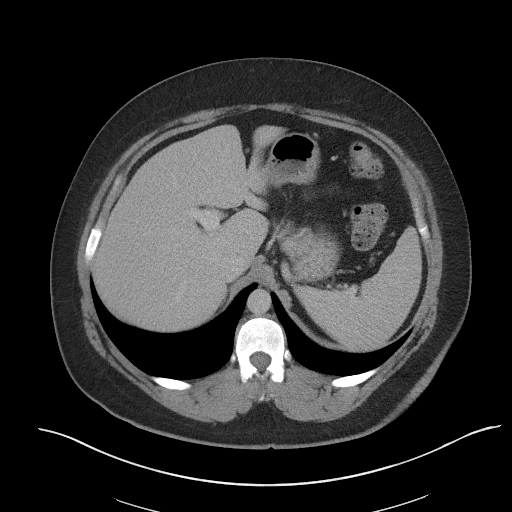
[im 81/94  soft-tissue]
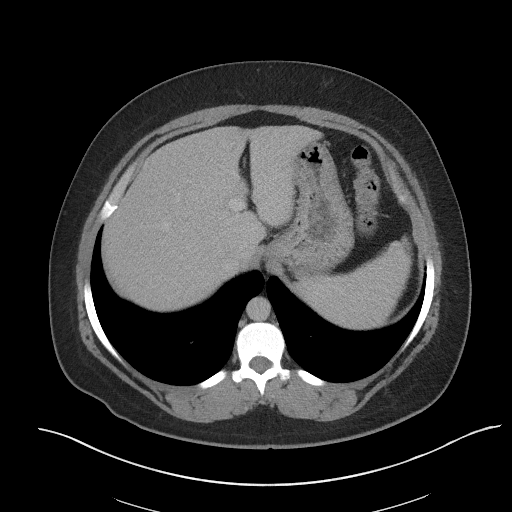
[im 89/94  soft-tissue]
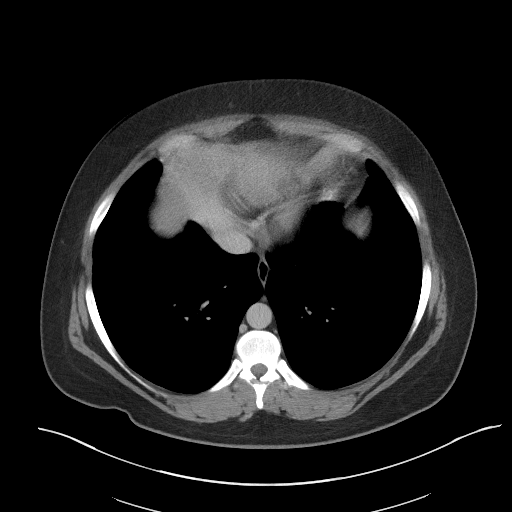

[Series 5: coronal st · coronal · 0.93mm/px · 3 of 103 slices shown]
[im 35/103  soft-tissue]
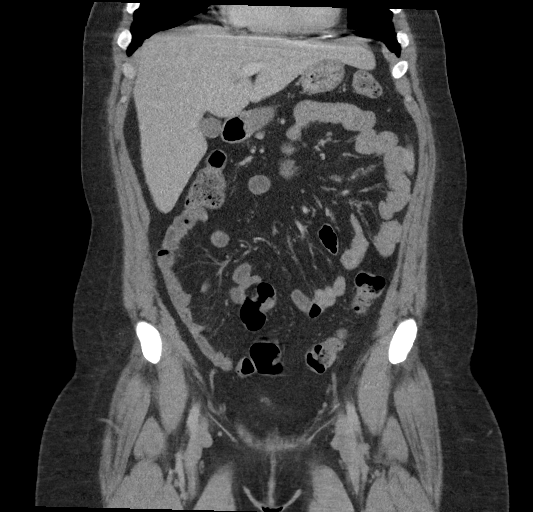
[im 46/103  soft-tissue]
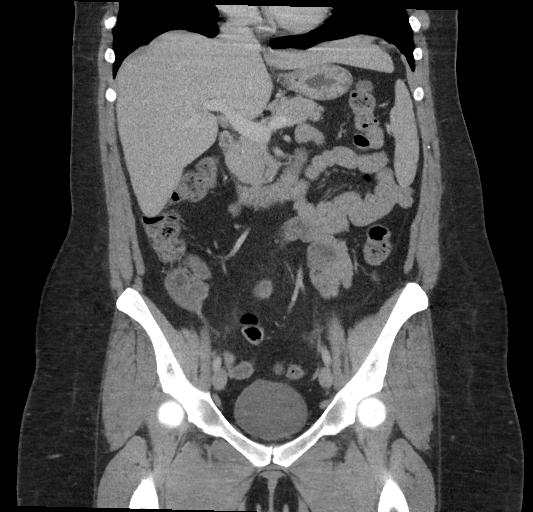
[im 57/103  soft-tissue]
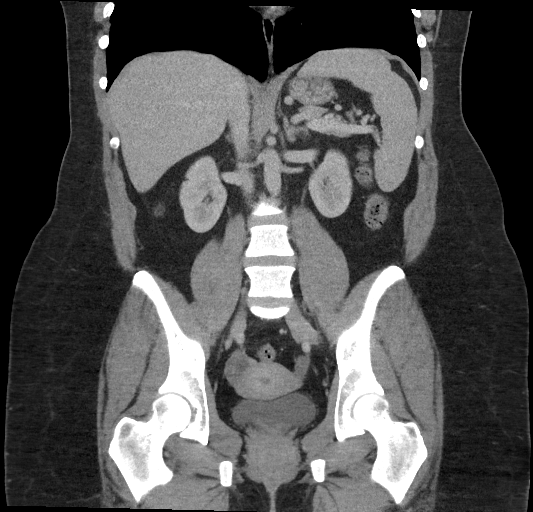

[17 of 46 positions shown; findings below may reference images not displayed]

FINDINGS: Lower chest: No acute abnormality.

Hepatobiliary: No focal liver abnormality is seen. No gallstones,
gallbladder wall thickening, or biliary dilatation.

Pancreas: Unremarkable. No pancreatic ductal dilatation or
surrounding inflammatory changes.

Spleen: Long and thin in appearance without focal abnormality.

Adrenals/Urinary Tract: Adrenal glands are unremarkable. Kidneys are
normal, without renal calculi, focal lesion, or hydronephrosis.
Bladder is unremarkable.

Stomach/Bowel: Normal appendix. No bowel obstruction or
inflammation. Decompressed stomach without acute abnormality.

Vascular/Lymphatic: No significant vascular findings are present. No
enlarged abdominal or pelvic lymph nodes.

Reproductive: Complex cyst of the right ovary measuring 2.7 cm with
fluid-fluid level. Hemorrhagic cyst or endometrioma might account
for this appearance.

Other: No abdominal wall hernia or abnormality. No abdominopelvic
ascites.

Musculoskeletal: No acute or significant osseous findings.
IMPRESSION: Complex cyst of the right ovary measuring 2.7 cm with fluid-fluid
level. Hemorrhagic cyst or endometrioma might account for this
appearance. Otherwise negative exam. Normal appendix.

## 2018-09-15 MED ORDER — IBUPROFEN 600 MG PO TABS: 600.0000 mg | ORAL_TABLET | Freq: Three times a day (TID) | ORAL | 0 refills | Status: DC | PRN

## 2018-09-15 MED ORDER — ONDANSETRON 4 MG PO TBDP: 4.0000 mg | ORAL_TABLET | Freq: Three times a day (TID) | ORAL | 0 refills | Status: DC | PRN

## 2018-09-15 MED ORDER — IOPAMIDOL (ISOVUE-300) INJECTION 61%
100.0000 mL | Freq: Once | INTRAVENOUS | Status: AC | PRN
  Administered 2018-09-15: 100 mL via INTRAVENOUS

## 2018-09-15 MED ORDER — LORAZEPAM 2 MG/ML IJ SOLN
1.0000 mg | Freq: Once | INTRAMUSCULAR | Status: AC
  Administered 2018-09-15: 1 mg via INTRAVENOUS
  Filled 2018-09-15: qty 1

## 2018-09-15 MED ORDER — HYDROCODONE-ACETAMINOPHEN 5-325 MG PO TABS: 1.0000 | ORAL_TABLET | Freq: Four times a day (QID) | ORAL | 0 refills | Status: DC | PRN

## 2018-09-15 MED ORDER — HALOPERIDOL LACTATE 5 MG/ML IJ SOLN
2.5000 mg | Freq: Once | INTRAMUSCULAR | Status: AC
  Administered 2018-09-15: 2.5 mg via INTRAVENOUS
  Filled 2018-09-15: qty 1

## 2018-09-15 MED ORDER — FENTANYL CITRATE (PF) 100 MCG/2ML IJ SOLN
75.0000 ug | Freq: Once | INTRAMUSCULAR | Status: AC
  Administered 2018-09-15: 75 ug via INTRAVENOUS
  Filled 2018-09-15: qty 2

## 2018-09-15 MED ORDER — KETOROLAC TROMETHAMINE 30 MG/ML IJ SOLN
15.0000 mg | Freq: Once | INTRAMUSCULAR | Status: AC
  Administered 2018-09-15: 15 mg via INTRAVENOUS
  Filled 2018-09-15: qty 1

## 2018-09-15 MED ORDER — SODIUM CHLORIDE 0.9 % IV BOLUS
1000.0000 mL | Freq: Once | INTRAVENOUS | Status: AC
  Administered 2018-09-15: 1000 mL via INTRAVENOUS

## 2018-09-15 NOTE — ED Notes (Signed)
Pt calls out and states she thinks she is having a panic attack. Dr Mable [place] notified. New orders received.

## 2018-09-15 NOTE — Discharge Instructions (Signed)
Fortunately today your lab work and CT scan were reassuring.  I expect your pain to last for another 24 to 48 hours.  Please take your pain medication and nausea medication as needed for severe symptoms and make an appointment to establish care with the [facility] gynecologist I referred you to within the next week or so.  Return to the emergency department sooner for any new worsening symptoms such as fevers, chills, worsening pain, if you cannot eat or drink, or for any other issues whatsoever.  While here in the ER today you received very powerful medicine that makes it unsafe for you to drive for the rest of the day.  Do not drive until tomorrow.  It was a pleasure to take care of you today, and thank you for coming to our emergency department.  If you have any questions or concerns before leaving please ask the nurse to grab me and I'm more than happy to go through your aftercare instructions again.  If you were prescribed any opioid pain medication today such as Norco, Vicodin, Percocet, morphine, hydrocodone, or oxycodone please make sure you do not drive when you are taking this medication as it can alter your ability to drive safely.  If you have any concerns once you are home that you are not improving or are in fact getting worse before you can make it to your follow-up appointment, please do not hesitate to call 911 and come back for further evaluation.  Darel Hong, MD  Results for orders placed or performed during the hospital encounter of 09/14/18  CBC  Result Value Ref Range   WBC 11.9 (H) 4.0 - 10.5 K/uL   RBC 4.85 3.87 - 5.11 MIL/uL   Hemoglobin 14.0 12.0 - 15.0 g/dL   HCT 43.0 36.0 - 46.0 %   MCV 88.7 80.0 - 100.0 fL   MCH 28.9 26.0 - 34.0 pg   MCHC 32.6 30.0 - 36.0 g/dL   RDW 14.1 11.5 - 15.5 %   Platelets 189 150 - 400 K/uL   nRBC 0.0 0.0 - 0.2 %  Comprehensive metabolic panel  Result Value Ref Range   Sodium 138 135 - 145 mmol/L   Potassium 4.0 3.5 - 5.1 mmol/L   Chloride 108 98 - 111 mmol/L   CO2 23 22 - 32 mmol/L   Glucose, Bld 94 70 - 99 mg/dL   BUN 10 6 - 20 mg/dL   Creatinine, Ser 0.62 0.44 - 1.00 mg/dL   Calcium 9.5 8.9 - 10.3 mg/dL   Total Protein 7.0 6.5 - 8.1 g/dL   Albumin 4.0 3.5 - 5.0 g/dL   AST 12 (L) 15 - 41 U/L   ALT 16 0 - 44 U/L   Alkaline Phosphatase 76 38 - 126 U/L   Total Bilirubin 0.5 0.3 - 1.2 mg/dL   GFR calc non Af Amer >60 >60 mL/min   GFR calc Af Amer >60 >60 mL/min   Anion gap 7 5 - 15  Lipase, blood  Result Value Ref Range   Lipase 23 11 - 51 U/L  Urinalysis, Complete w Microscopic  Result Value Ref Range   Color, Urine YELLOW (A) YELLOW   APPearance CLEAR (A) CLEAR   Specific Gravity, Urine 1.017 1.005 - 1.030   pH 6.0 5.0 - 8.0   Glucose, UA NEGATIVE NEGATIVE mg/dL   Hgb urine dipstick NEGATIVE NEGATIVE   Bilirubin Urine NEGATIVE NEGATIVE   Ketones, ur NEGATIVE NEGATIVE mg/dL   Protein, ur NEGATIVE NEGATIVE  mg/dL   Nitrite NEGATIVE NEGATIVE   Leukocytes, UA NEGATIVE NEGATIVE   RBC / HPF 0-5 0 - 5 RBC/hpf   WBC, UA 0-5 0 - 5 WBC/hpf   Bacteria, UA NONE SEEN NONE SEEN   Squamous Epithelial / LPF 0-5 0 - 5   Mucus PRESENT   Pregnancy, urine POC  Result Value Ref Range   Preg Test, Ur NEGATIVE NEGATIVE   Ct Abdomen Pelvis W Contrast  Result Date: 09/15/2018 CLINICAL DATA:  Generalized pain starting yesterday without vomiting or diarrhea. No dysuria. EXAM: CT ABDOMEN AND PELVIS WITH CONTRAST TECHNIQUE: Multidetector CT imaging of the abdomen and pelvis was performed using the standard protocol following bolus administration of intravenous contrast. CONTRAST:  123mL ISOVUE-300 IOPAMIDOL (ISOVUE-300) INJECTION 61% COMPARISON:  None. FINDINGS: Lower chest: No acute abnormality. Hepatobiliary: No focal liver abnormality is seen. No gallstones, gallbladder wall thickening, or biliary dilatation. Pancreas: Unremarkable. No pancreatic ductal dilatation or surrounding inflammatory changes. Spleen: Long and thin in  appearance without focal abnormality. Adrenals/Urinary Tract: Adrenal glands are unremarkable. Kidneys are normal, without renal calculi, focal lesion, or hydronephrosis. Bladder is unremarkable. Stomach/Bowel: Normal appendix. No bowel obstruction or inflammation. Decompressed stomach without acute abnormality. Vascular/Lymphatic: No significant vascular findings are present. No enlarged abdominal or pelvic lymph nodes. Reproductive: Complex cyst of the right ovary measuring 2.7 cm with fluid-fluid level. Hemorrhagic cyst or endometrioma might account for this appearance. Other: No abdominal wall hernia or abnormality. No abdominopelvic ascites. Musculoskeletal: No acute or significant osseous findings. IMPRESSION: Complex cyst of the right ovary measuring 2.7 cm with fluid-fluid level. Hemorrhagic cyst or endometrioma might account for this appearance. Otherwise negative exam. Normal appendix. Electronically Signed   By: Ashley Royalty M.D.   On: 09/15/2018 01:00

## 2018-09-15 NOTE — ED Notes (Signed)
Pt went to Ct

## 2018-10-20 ENCOUNTER — Ambulatory Visit (INDEPENDENT_AMBULATORY_CARE_PROVIDER_SITE_OTHER): Payer: 59 | Admitting: Gastroenterology

## 2018-10-20 ENCOUNTER — Encounter: Payer: Self-pay | Admitting: Gastroenterology

## 2018-10-20 VITALS — BP 124/79 | HR 75 | Ht 65.5 in | Wt 259.2 lb

## 2018-10-20 DIAGNOSIS — K582 Mixed irritable bowel syndrome: Secondary | ICD-10-CM | POA: Diagnosis not present

## 2018-10-20 DIAGNOSIS — R1012 Left upper quadrant pain: Secondary | ICD-10-CM | POA: Diagnosis not present

## 2018-10-20 DIAGNOSIS — K219 Gastro-esophageal reflux disease without esophagitis: Secondary | ICD-10-CM | POA: Diagnosis not present

## 2018-10-20 NOTE — Progress Notes (Signed)
Jonathon Bellows MD, MRCP(U.K) [address]  [place]  [place], [place] [address]  Main: [phone]  Fax: [phone]   Primary Care Physician: Elby Beck, FNP  Primary Gastroenterologist:  Dr. Jonathon Bellows   No chief complaint on file.   HPI: Yesenia Beasley is a 33 y.o. female   Summary of history :   She is here today to follow up for GERD. She underwent a colonoscopy by me in 09/2017 due to personal history of colon polyps. I found one inflammatory polyp. Subsequent Genetic testing did not show any abnormalities for the cause of polyps at such a young age.  She has had reflux since 11/2017 , described it as a fire in her chest .She started taking prilosec, zantac,  Interval history 7/1//2019-10/20/2018  09/2018 : Ct abdomen - no gi issues  Weaned off Omeprazole, adhering to life style changes. Gained 4 lbs, trying to loose it.   Since last visit she says she has some "stomach issues" , recently diagnosed with ovarian cyst. She says that she was talking to her Doctor , felt some issues she had were GI related.   Her main symptoms : "bowel movements are all over the place" , 50/50 constipation and diarrhea. She says that will feel constipated, have a solid bowel movement and then followed diarrhea. Has a bowel movement daily. Lots of bloating. Says at times eats and has diarrhea right after. Under a lot of stress. On days she is more stressed does not affect her GI issues. On zoloft and xanax.  She has her periods. These symptoms are worse around her period. Not consistently though. Has pain during sexual intercourse. Not had kids, no manual disimpaction .   2-3 portions of vegetables a day, some fruit and not much grain. Lots of meat including chicken and [place].    Current Outpatient Medications  Medication Sig Dispense Refill  . albuterol (PROVENTIL HFA;VENTOLIN HFA) 108 (90 Base) MCG/ACT inhaler TAKE 2 PUFFS BY MOUTH EVERY 6 HOURS AS NEEDED FOR WHEEZE OR  SHORTNESS OF BREATH 8.5 Inhaler 2  . ALPRAZolam (XANAX) 0.5 MG tablet TAKE 1-2 TABLETS (0.5-1 MG TOTAL) BY MOUTH 2 (TWO) TIMES DAILY AS NEEDED FOR ANXIETY. 45 tablet 1  . butalbital-acetaminophen-caffeine (FIORICET, ESGIC) 50-325-40 MG tablet Take 1 tablet by mouth every 6 (six) hours as needed for headache. 20 tablet 0  . cetirizine (ZYRTEC) 5 MG tablet Take 1 tablet (5 mg total) by mouth daily. 30 tablet 1  . cyclobenzaprine (FLEXERIL) 5 MG tablet Take 5 mg by mouth 3 (three) times daily as needed for muscle spasms.    . fluticasone (FLONASE) 50 MCG/ACT nasal spray Place 1 spray into both nostrils daily. 16 g 2  . HYDROcodone-acetaminophen (NORCO) 5-325 MG tablet Take 1 tablet by mouth every 6 (six) hours as needed for up to 7 doses for severe pain. 7 tablet 0  . ibuprofen (ADVIL,MOTRIN) 600 MG tablet Take 1 tablet (600 mg total) by mouth every 8 (eight) hours as needed. 30 tablet 0  . omeprazole (PRILOSEC) 40 MG capsule Take 1 capsule (40 mg total) by mouth daily. 90 capsule 3  . omeprazole (PRILOSEC) 40 MG capsule Take by mouth daily.  3  . ondansetron (ZOFRAN ODT) 4 MG disintegrating tablet Take 1 tablet (4 mg total) by mouth every 8 (eight) hours as needed for nausea or vomiting. 20 tablet 0  . sertraline (ZOLOFT) 100 MG tablet Take 1 tablet (100 mg total) by mouth daily. 90 tablet  3  . sertraline (ZOLOFT) 50 MG tablet Take 1 tablet (50 mg total) by mouth daily. With 100 mg. 90 tablet 1   No current facility-administered medications for this visit.     Allergies as of 10/20/2018  . (No Known Allergies)    ROS:  General: Negative for anorexia, weight loss, fever, chills, fatigue, weakness. ENT: Negative for hoarseness, difficulty swallowing , nasal congestion. CV: Negative for chest pain, angina, palpitations, dyspnea on exertion, peripheral edema.  Respiratory: Negative for dyspnea at rest, dyspnea on exertion, cough, sputum, wheezing.  GI: See history of present illness. GU:   Negative for dysuria, hematuria, urinary incontinence, urinary frequency, nocturnal urination.  Endo: Negative for unusual weight change.    Physical Examination:   There were no vitals taken for this visit.  General: Well-nourished, well-developed in no acute distress.  Eyes: No icterus. Conjunctivae pink. Mouth: Oropharyngeal mucosa moist and pink , no lesions erythema or exudate. Lungs: Clear to auscultation bilaterally. Non-labored. Heart: Regular rate and rhythm, no murmurs rubs or gallops.  Abdomen: Bowel sounds are normal, nontender, nondistended, no hepatosplenomegaly or masses, no abdominal bruits or hernia , no rebound or guarding.   Extremities: No lower extremity edema. No clubbing or deformities. Neuro: Alert and oriented x 3.  Grossly intact. Skin: Warm and dry, no jaundice.   Psych: Alert and cooperative, normal mood and affect.   Imaging Studies: No results found.  Assessment and Plan:   Yesenia Beasley is a 33 y.o. y/o female here to follow up for GERD which is stable , having symptoms suggesting IBS mixed with predominate constipation and diet low in fiber. Some LUQ pain relieved with defecation likely secondary to IBS  Plan  1. High fiber diet patient information  2. Fiber pills samples provided 3. IB guard 4.Miralax 5. H pylori breath test 6. If no better at next visit will consider EGD, Bentyl.     Dr Jonathon Bellows  MD,MRCP [facility]) Follow up in 4 weeks

## 2018-10-20 NOTE — Patient Instructions (Signed)

## 2018-10-21 LAB — H. PYLORI BREATH TEST: H pylori Breath Test: NEGATIVE

## 2018-10-25 ENCOUNTER — Encounter: Payer: Self-pay | Admitting: Gastroenterology

## 2018-10-30 ENCOUNTER — Other Ambulatory Visit: Payer: Self-pay | Admitting: Family Medicine

## 2018-11-08 ENCOUNTER — Other Ambulatory Visit: Payer: Self-pay | Admitting: Family Medicine

## 2018-11-08 DIAGNOSIS — F410 Panic disorder [episodic paroxysmal anxiety] without agoraphobia: Secondary | ICD-10-CM

## 2018-11-09 NOTE — Telephone Encounter (Signed)
Electronic refill request Alprazolam Last office visit 07/14/18 Last refill 09/10/18 #45/1 No upcoming appointment scheduled

## 2018-11-25 ENCOUNTER — Telehealth: Payer: Self-pay | Admitting: Gastroenterology

## 2018-11-25 NOTE — Telephone Encounter (Signed)
Left vm for pt to call office and r/s apt for 12/01/18 due to corona virus if pt is still constipated please notify nurse

## 2018-11-29 ENCOUNTER — Other Ambulatory Visit: Payer: Self-pay | Admitting: Family Medicine

## 2018-11-29 ENCOUNTER — Encounter: Payer: Self-pay | Admitting: Family Medicine

## 2018-11-29 DIAGNOSIS — F410 Panic disorder [episodic paroxysmal anxiety] without agoraphobia: Secondary | ICD-10-CM

## 2018-11-29 MED ORDER — ALPRAZOLAM 0.5 MG PO TABS: 0.5000 mg | ORAL_TABLET | Freq: Two times a day (BID) | ORAL | 1 refills | Status: DC | PRN

## 2018-12-01 ENCOUNTER — Ambulatory Visit: Payer: 59 | Admitting: Gastroenterology

## 2018-12-16 ENCOUNTER — Encounter: Payer: Self-pay | Admitting: Family Medicine

## 2019-01-01 ENCOUNTER — Other Ambulatory Visit: Payer: Self-pay | Admitting: Family Medicine

## 2019-01-01 DIAGNOSIS — F339 Major depressive disorder, recurrent, unspecified: Secondary | ICD-10-CM

## 2019-01-01 DIAGNOSIS — F419 Anxiety disorder, unspecified: Secondary | ICD-10-CM

## 2019-01-03 ENCOUNTER — Other Ambulatory Visit: Payer: Self-pay

## 2019-01-03 DIAGNOSIS — F410 Panic disorder [episodic paroxysmal anxiety] without agoraphobia: Secondary | ICD-10-CM

## 2019-01-03 DIAGNOSIS — F339 Major depressive disorder, recurrent, unspecified: Secondary | ICD-10-CM

## 2019-01-03 MED ORDER — SERTRALINE HCL 100 MG PO TABS: 100.0000 mg | ORAL_TABLET | Freq: Every day | ORAL | 1 refills | Status: DC

## 2019-01-03 NOTE — Telephone Encounter (Signed)
Does patient need a virtual appointment and labs?

## 2019-01-05 ENCOUNTER — Other Ambulatory Visit: Payer: Self-pay | Admitting: Family Medicine

## 2019-01-05 ENCOUNTER — Telehealth: Payer: Self-pay

## 2019-01-05 DIAGNOSIS — J4520 Mild intermittent asthma, uncomplicated: Secondary | ICD-10-CM

## 2019-01-05 MED ORDER — ALBUTEROL SULFATE HFA 108 (90 BASE) MCG/ACT IN AERS: 2.0000 | INHALATION_SPRAY | RESPIRATORY_TRACT | 1 refills | Status: DC | PRN

## 2019-01-05 NOTE — Telephone Encounter (Signed)
New prescription sent to patient's pharmacy

## 2019-01-05 NOTE — Telephone Encounter (Signed)
Spoke with [place], pharmacist, generic albuterol inhaler has been taking off the market (not sure if it is a back order issue), and pharmacy wanted to see if RX for Ventolin or Proair can be sent in instead (brand name since no generic is available). Please review.

## 2019-01-17 ENCOUNTER — Other Ambulatory Visit: Payer: Self-pay | Admitting: Family Medicine

## 2019-01-17 ENCOUNTER — Encounter: Payer: Self-pay | Admitting: Family Medicine

## 2019-01-17 DIAGNOSIS — J4520 Mild intermittent asthma, uncomplicated: Secondary | ICD-10-CM

## 2019-01-17 MED ORDER — ALBUTEROL SULFATE HFA 108 (90 BASE) MCG/ACT IN AERS: 2.0000 | INHALATION_SPRAY | RESPIRATORY_TRACT | 1 refills | Status: DC | PRN

## 2019-01-17 NOTE — Progress Notes (Signed)
Received letter that Proventil HFA not covered. Sent in prescription for ventolin HFA. Sent patient a Therapist, music.

## 2019-01-19 ENCOUNTER — Other Ambulatory Visit: Payer: Self-pay | Admitting: Family Medicine

## 2019-01-19 DIAGNOSIS — J4520 Mild intermittent asthma, uncomplicated: Secondary | ICD-10-CM

## 2019-01-19 MED ORDER — ALBUTEROL SULFATE 108 (90 BASE) MCG/ACT IN AEPB: 2.0000 | INHALATION_SPRAY | RESPIRATORY_TRACT | 2 refills | Status: DC | PRN

## 2019-01-31 ENCOUNTER — Other Ambulatory Visit: Payer: Self-pay | Admitting: Family Medicine

## 2019-01-31 DIAGNOSIS — F410 Panic disorder [episodic paroxysmal anxiety] without agoraphobia: Secondary | ICD-10-CM

## 2019-02-01 NOTE — Telephone Encounter (Signed)
Refill request for Alprazolam. Last filled on 11/29/2018 for #60 awith 1 refill. Last visit was Physical on 07/14/2018. No future appointments scheduled. Please review

## 2019-02-11 ENCOUNTER — Encounter: Payer: Self-pay | Admitting: Family Medicine

## 2019-02-11 ENCOUNTER — Other Ambulatory Visit: Payer: Self-pay | Admitting: Family Medicine

## 2019-02-11 DIAGNOSIS — F410 Panic disorder [episodic paroxysmal anxiety] without agoraphobia: Secondary | ICD-10-CM

## 2019-02-11 NOTE — Telephone Encounter (Signed)
Alprazolam was refilled on 02/01/2019 but was set on print.  So I have called refill into [facility] in [place].  Patient notified by MyChart.

## 2019-02-11 NOTE — Telephone Encounter (Signed)
See pt message 02/11/19 for xanax refill.

## 2019-03-11 ENCOUNTER — Other Ambulatory Visit: Payer: Self-pay | Admitting: Family Medicine

## 2019-03-11 DIAGNOSIS — F410 Panic disorder [episodic paroxysmal anxiety] without agoraphobia: Secondary | ICD-10-CM

## 2019-03-14 ENCOUNTER — Other Ambulatory Visit: Payer: Self-pay | Admitting: Family Medicine

## 2019-03-14 DIAGNOSIS — F410 Panic disorder [episodic paroxysmal anxiety] without agoraphobia: Secondary | ICD-10-CM

## 2019-03-14 MED ORDER — ALPRAZOLAM 0.5 MG PO TABS: 0.5000 mg | ORAL_TABLET | Freq: Two times a day (BID) | ORAL | 0 refills | Status: DC | PRN

## 2019-03-14 NOTE — Telephone Encounter (Signed)
Last filled 02/01/2019 # 60... last OV CPE 07/2018...Marland Kitchen please advise

## 2019-04-17 ENCOUNTER — Telehealth: Payer: Self-pay | Admitting: Family Medicine

## 2019-04-17 DIAGNOSIS — F410 Panic disorder [episodic paroxysmal anxiety] without agoraphobia: Secondary | ICD-10-CM

## 2019-04-18 NOTE — Telephone Encounter (Signed)
Due for follow back in Feb 2020 - pt did not make appt.  Last OV 07/2018 Last refilled 7/6/ #30 x 0rf - advised f/u needed in order to get further refills.   Rx denied at this time. Pt will need to schedule OV

## 2019-04-18 NOTE — Addendum Note (Signed)
Addended by: Virl Cagey on: 04/18/2019 02:59 PM   Modules accepted: Orders

## 2019-04-25 ENCOUNTER — Other Ambulatory Visit: Payer: Self-pay | Admitting: Family Medicine

## 2019-04-25 DIAGNOSIS — F410 Panic disorder [episodic paroxysmal anxiety] without agoraphobia: Secondary | ICD-10-CM

## 2019-04-25 NOTE — Telephone Encounter (Signed)
Electronic refill request. Alprazolam Last office visit:   07/14/2018 CPE Last Filled:    60 tablet 0 04/18/2019  Please advise.

## 2019-04-25 NOTE — Telephone Encounter (Signed)
Patient called to get her refill.  I let her know she needed an office visit for a refill.  Patient scheduled appointment on  04/27/19 at 8:00.

## 2019-04-27 ENCOUNTER — Encounter: Payer: Self-pay | Admitting: Family Medicine

## 2019-04-27 ENCOUNTER — Ambulatory Visit (INDEPENDENT_AMBULATORY_CARE_PROVIDER_SITE_OTHER): Payer: 59 | Admitting: Family Medicine

## 2019-04-27 ENCOUNTER — Other Ambulatory Visit: Payer: Self-pay

## 2019-04-27 VITALS — Temp 97.8°F | Wt 268.0 lb

## 2019-04-27 DIAGNOSIS — F339 Major depressive disorder, recurrent, unspecified: Secondary | ICD-10-CM

## 2019-04-27 DIAGNOSIS — F419 Anxiety disorder, unspecified: Secondary | ICD-10-CM | POA: Diagnosis not present

## 2019-04-27 DIAGNOSIS — F410 Panic disorder [episodic paroxysmal anxiety] without agoraphobia: Secondary | ICD-10-CM | POA: Diagnosis not present

## 2019-04-27 DIAGNOSIS — N83209 Unspecified ovarian cyst, unspecified side: Secondary | ICD-10-CM | POA: Insufficient documentation

## 2019-04-27 MED ORDER — ALPRAZOLAM 0.5 MG PO TABS: ORAL_TABLET | ORAL | 2 refills | Status: DC

## 2019-04-27 MED ORDER — SERTRALINE HCL 100 MG PO TABS: 200.0000 mg | ORAL_TABLET | Freq: Every day | ORAL | 1 refills | Status: DC

## 2019-04-27 NOTE — Progress Notes (Signed)
Virtual Visit via Video Note  I connected with Yesenia Beasley on 04/27/19 at  8:00 AM EDT by a video enabled telemedicine application and verified that I am speaking with the correct person using two identifiers.  Location: Patient: at home Provider: [name]   I discussed the limitations of evaluation and management by telemedicine and the availability of in person appointments. The patient expressed understanding and agreed to proceed.  History of Present Illness: This is a 33 yo female who presents today for virtual visit to follow up on anxiety and depression. She reports increased anxiety and panic attacks with pandemic. She has been having more issues with ovarian cysts and has been very afraid of having to seek medical attention and having to go alone due to pandemic restrictions. Her significant other works nights and she is always afraid at night. She would like to increase her sertraline to 200 mg from 150 mg. She is using alprazolam nightly for sleep as well as in the morning about 1/2 of the days. She is agreeable to psychiatry referral. Has had psychology referral in past with many years of CBT and she feels that she uses many techniques and is able to abort panic attacks, but anxiety continues to feel overwhelming.   Recurrent ovarian cysts- she is going to have a progestin only IUD placed later this month.     Past Medical History:  Diagnosis Date  . Anxiety   . Genetic testing 11/09/2017   Multi-Cancer panel (83 genes) @ [facility] - No pathogenic mutations detected   Past Surgical History:  Procedure Laterality Date  . COLONOSCOPY WITH ESOPHAGOGASTRODUODENOSCOPY (EGD)    . COLONOSCOPY WITH PROPOFOL N/A 10/05/2017   Procedure: COLONOSCOPY WITH PROPOFOL;  Surgeon: Jonathon Bellows, MD;  Location: [facility] ENDOSCOPY;  Service: Gastroenterology;  Laterality: N/A;  . KNEE SURGERY Bilateral 2002,2003,2014   Family History  Problem Relation Age of Onset  . Leukemia Mother    CMML; dx 58s; deceased 22  . Heart disease Father   . Colon polyps Father        initially in 64s; #/type unknown  . Wilm's tumor Sister        deceased at 2  . Brain cancer Maternal Grandfather        astrocytoma; deceased 72  . Heart attack Paternal Grandfather        deceased at 49  . Thyroid cancer Maternal Uncle        dx 55s  . Non-Hodgkin's lymphoma Maternal Aunt    Social History   Tobacco Use  . Smoking status: Current Every Day Smoker  . Smokeless tobacco: Never Used  Substance Use Topics  . 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 Drug use: No      Observations/Objective: Patient is alert and answers questions appropriately.  Visible skin is unremarkable.  She is normally conversive without shortness of breath, audible wheeze or witnessed cough.  Her mood and affect are appropriate. Temp 97.8 F (36.6 C) Comment: per patient  Wt 268 lb (121.6 kg) Comment: per patient  BMI 43.92 kg/m  Wt Readings from Last 3 Encounters:  04/27/19 268 lb (121.6 kg)  10/20/18 259 lb 3.2 oz (117.6 kg)  09/14/18 250 lb (113.4 kg)   Depression screen [facility] 2/9 04/27/2019 07/14/2018 01/06/2018 06/24/2017  Decreased Interest 1 0 1 1  Down, Depressed, Hopeless 1 0 1 1  PHQ - 2 Score 2 0 2 2  Altered sleeping 3 - 3  2  Tired, decreased energy 1 - 2 2  Change in appetite 0 - 2 2  Feeling bad or failure about yourself  0 - 0 1  Trouble concentrating 0 - 1 2  Moving slowly or fidgety/restless 0 - 1 0  Suicidal thoughts 0 - 0 0  PHQ-9 Score 6 - 11 11  Difficult doing work/chores Somewhat difficult - Somewhat difficult -    Assessment and Plan: 1. Panic disorder -She is agreeable to referral to psychiatry -We will increase her sertraline from 150 mg nightly to 200 mg nightly - ALPRAZolam (XANAX) 0.5 MG tablet; TAKE 1-2 TABLETS BY MOUTH TWICE DAILY AS NEEDED FOR ANXIETY  Dispense: 60 tablet; Refill: 2 - sertraline (ZOLOFT) 100 MG tablet; Take 2 tablets (200 mg total) by mouth daily.   Dispense: 180 tablet; Refill: 1 - Ambulatory referral to Psychiatry -Follow-up in 3 months  2. Depression, recurrent ([place]) - sertraline (ZOLOFT) 100 MG tablet; Take 2 tablets (200 mg total) by mouth daily.  Dispense: 180 tablet; Refill: 1 - Ambulatory referral to Psychiatry    Clarene Reamer, FNP-BC  [facility]  04/27/2019 11:53 AM   Follow Up Instructions:    I discussed the assessment and treatment plan with the patient. The patient was provided an opportunity to ask questions and all were answered. The patient agreed with the plan and demonstrated an understanding of the instructions.   The patient was advised to call back or seek an in-person evaluation if the symptoms worsen or if the condition fails to improve as anticipated.   Elby Beck, FNP

## 2019-04-27 NOTE — Telephone Encounter (Signed)
Refills done at virtual visit.

## 2019-05-28 ENCOUNTER — Other Ambulatory Visit: Payer: Self-pay | Admitting: Family Medicine

## 2019-05-28 DIAGNOSIS — J4520 Mild intermittent asthma, uncomplicated: Secondary | ICD-10-CM

## 2019-05-30 ENCOUNTER — Other Ambulatory Visit: Payer: Self-pay | Admitting: Family Medicine

## 2019-05-30 DIAGNOSIS — J4520 Mild intermittent asthma, uncomplicated: Secondary | ICD-10-CM

## 2019-05-30 NOTE — Telephone Encounter (Signed)
Per notes in May per notes patient is using Proair Respiclick inhaler

## 2019-06-02 ENCOUNTER — Encounter: Payer: Self-pay | Admitting: Family Medicine

## 2019-06-02 DIAGNOSIS — J4520 Mild intermittent asthma, uncomplicated: Secondary | ICD-10-CM

## 2019-06-02 MED ORDER — PROAIR RESPICLICK 108 (90 BASE) MCG/ACT IN AEPB: 2.0000 | INHALATION_SPRAY | RESPIRATORY_TRACT | 2 refills | Status: DC | PRN

## 2019-06-20 ENCOUNTER — Encounter: Payer: Self-pay | Admitting: Family Medicine

## 2019-06-20 ENCOUNTER — Other Ambulatory Visit: Payer: Self-pay | Admitting: Family Medicine

## 2019-06-20 DIAGNOSIS — F410 Panic disorder [episodic paroxysmal anxiety] without agoraphobia: Secondary | ICD-10-CM

## 2019-06-20 DIAGNOSIS — F339 Major depressive disorder, recurrent, unspecified: Secondary | ICD-10-CM

## 2019-06-20 DIAGNOSIS — F419 Anxiety disorder, unspecified: Secondary | ICD-10-CM

## 2019-07-12 ENCOUNTER — Telehealth: Payer: 59 | Admitting: Nurse Practitioner

## 2019-07-12 DIAGNOSIS — J0100 Acute maxillary sinusitis, unspecified: Secondary | ICD-10-CM

## 2019-07-12 MED ORDER — AMOXICILLIN-POT CLAVULANATE 875-125 MG PO TABS: 1.0000 | ORAL_TABLET | Freq: Two times a day (BID) | ORAL | 0 refills | Status: DC

## 2019-07-12 NOTE — Progress Notes (Signed)

## 2019-07-14 ENCOUNTER — Ambulatory Visit (INDEPENDENT_AMBULATORY_CARE_PROVIDER_SITE_OTHER)
Admission: RE | Admit: 2019-07-14 | Discharge: 2019-07-14 | Disposition: A | Discharge status: Home / Self Care | Payer: 59 | Source: Ambulatory Visit

## 2019-07-14 ENCOUNTER — Telehealth: Payer: Self-pay

## 2019-07-14 DIAGNOSIS — A46 Erysipelas: Secondary | ICD-10-CM

## 2019-07-14 MED ORDER — DOXYCYCLINE HYCLATE 100 MG PO CAPS: 100.0000 mg | ORAL_CAPSULE | Freq: Two times a day (BID) | ORAL | 0 refills | Status: AC

## 2019-07-14 NOTE — ED Provider Notes (Signed)
Virtual Visit via Video Note:  Yesenia Beasley  initiated request for Telemedicine visit with [facility] team. I connected with Yesenia Beasley  on 07/14/2019 at 1:43 PM  for a synchronized telemedicine visit using a video enabled HIPPA compliant telemedicine application. I verified that I am speaking with Yesenia Beasley  using two identifiers. Orvan July, NP  was physically located in a [facility] Urgent care site and Yesenia Beasley was located at a different location.   The limitations of evaluation and management by telemedicine as well as the availability of in-person appointments were discussed. Patient was informed that she  may incur a bill ( including co-pay) for this virtual visit encounter. Yesenia Beasley  expressed understanding and gave verbal consent to proceed with virtual visit.     History of Present Illness:Yesenia Beasley  is a 33 y.o. female presents with right facial swelling, redness, tenderness, low grade fever.  She is currently taking Augmentin for what she thought was a sinus infection.  Now she is believing this to be more of a facial skin infection.  She has been taking the Augmentin since Tuesday.  She is not seeing any improvement in her symptoms but only worsening.  She has been taking Tylenol also.  No dental pain, eye pain or trouble opening her mouth.  Past Medical History:  Diagnosis Date  . Anxiety   . Genetic testing 11/09/2017   Multi-Cancer panel (83 genes) @ [facility] - No pathogenic mutations detected  . Headache disorder   . History of ovarian cyst   . Pseudotumor cerebri     No Known Allergies      Observations/Objective:VITALS: Per patient if applicable, see vitals. GENERAL: Alert, appears well and in no acute distress. HEENT: Atraumatic, conjunctiva clear, no obvious abnormalities on inspection of external nose and ears. NECK: Normal movements of the head and neck. CARDIOPULMONARY: No increased WOB. Speaking in clear sentences. I:E ratio WNL.  MS: Moves all  visible extremities without noticeable abnormality. PSYCH: Pleasant and cooperative, well-groomed. Speech normal rate and rhythm. Affect is appropriate. Insight and judgement are appropriate. Attention is focused, linear, and appropriate.  NEURO: Oriented as arrived to appointment on time with no prompting. Moves both UE equally.  SKIN: Obvious erythema and swelling to right facial area, specifically cheek area.  No swelling to eyelids.  Conjunctival white No lip swelling or trismus  Assessment and Plan: Erysipelas-treating with doxycycline for MRSA coverage based on facial involvement.  She is currently taking Augmentin without any relief.  We will have her stop the Augmentin.    Follow Up Instructions: Recommend follow-up in person for any continued or worsening symptoms.    I discussed the assessment and treatment plan with the patient. The patient was provided an opportunity to ask questions and all were answered. The patient agreed with the plan and demonstrated an understanding of the instructions.   The patient was advised to call back or seek an in-person evaluation if the symptoms worsen or if the condition fails to improve as anticipated.     Orvan July, NP  07/14/2019 1:43 PM         Orvan July, NP 07/14/19 1352

## 2019-07-14 NOTE — Telephone Encounter (Signed)
LVM w COVID screen, front door and back lab info 11.5.2020 TLJ

## 2019-07-14 NOTE — Discharge Instructions (Signed)
Changing your antibiotic from Augmentin to doxycycline.  Take the medication as prescribed. If your symptoms worsen you need to be seen in person.

## 2019-07-17 ENCOUNTER — Other Ambulatory Visit: Payer: Self-pay | Admitting: Family Medicine

## 2019-07-17 DIAGNOSIS — E7800 Pure hypercholesterolemia, unspecified: Secondary | ICD-10-CM

## 2019-07-17 DIAGNOSIS — A46 Erysipelas: Secondary | ICD-10-CM

## 2019-07-18 ENCOUNTER — Other Ambulatory Visit: Payer: Self-pay

## 2019-07-18 ENCOUNTER — Other Ambulatory Visit (INDEPENDENT_AMBULATORY_CARE_PROVIDER_SITE_OTHER): Payer: 59

## 2019-07-18 DIAGNOSIS — Z6841 Body Mass Index (BMI) 40.0 and over, adult: Secondary | ICD-10-CM | POA: Diagnosis not present

## 2019-07-18 DIAGNOSIS — E7800 Pure hypercholesterolemia, unspecified: Secondary | ICD-10-CM

## 2019-07-18 DIAGNOSIS — A46 Erysipelas: Secondary | ICD-10-CM

## 2019-07-18 LAB — CBC WITH DIFFERENTIAL/PLATELET
Basophils Absolute: 0.1 10*3/uL (ref 0.0–0.1)
Basophils Relative: 0.7 % (ref 0.0–3.0)
Eosinophils Absolute: 0.4 10*3/uL (ref 0.0–0.7)
Eosinophils Relative: 4.5 % (ref 0.0–5.0)
HCT: 42.4 % (ref 36.0–46.0)
Hemoglobin: 14.3 g/dL (ref 12.0–15.0)
Lymphocytes Relative: 21.4 % (ref 12.0–46.0)
Lymphs Abs: 2 10*3/uL (ref 0.7–4.0)
MCHC: 33.7 g/dL (ref 30.0–36.0)
MCV: 88.3 fl (ref 78.0–100.0)
Monocytes Absolute: 0.5 10*3/uL (ref 0.1–1.0)
Monocytes Relative: 5.1 % (ref 3.0–12.0)
Neutro Abs: 6.3 10*3/uL (ref 1.4–7.7)
Neutrophils Relative %: 68.3 % (ref 43.0–77.0)
Platelets: 156 10*3/uL (ref 150.0–400.0)
RBC: 4.8 Mil/uL (ref 3.87–5.11)
RDW: 14.6 % (ref 11.5–15.5)
WBC: 9.3 10*3/uL (ref 4.0–10.5)

## 2019-07-18 LAB — LIPID PANEL
Cholesterol: 181 mg/dL (ref 0–200)
HDL: 26.6 mg/dL — ABNORMAL LOW (ref >39.00)
LDL Cholesterol: 129 mg/dL — ABNORMAL HIGH (ref 0–99)
NonHDL: 154.43
Total CHOL/HDL Ratio: 7
Triglycerides: 126 mg/dL (ref 0.0–149.0)
VLDL: 25.2 mg/dL (ref 0.0–40.0)

## 2019-07-18 LAB — HEMOGLOBIN A1C: Hgb A1c MFr Bld: 5.5 % (ref 4.6–6.5)

## 2019-07-20 ENCOUNTER — Encounter: Payer: Self-pay | Admitting: Emergency Medicine

## 2019-07-20 ENCOUNTER — Emergency Department
Admission: EM | Admit: 2019-07-20 | Discharge: 2019-07-20 | Disposition: A | Discharge status: Home / Self Care | Payer: 59 | Attending: Emergency Medicine | Admitting: Emergency Medicine

## 2019-07-20 ENCOUNTER — Emergency Department: Payer: 59

## 2019-07-20 ENCOUNTER — Other Ambulatory Visit: Payer: Self-pay

## 2019-07-20 DIAGNOSIS — M79662 Pain in left lower leg: Secondary | ICD-10-CM | POA: Insufficient documentation

## 2019-07-20 DIAGNOSIS — M7989 Other specified soft tissue disorders: Secondary | ICD-10-CM

## 2019-07-20 DIAGNOSIS — F17210 Nicotine dependence, cigarettes, uncomplicated: Secondary | ICD-10-CM | POA: Diagnosis not present

## 2019-07-20 LAB — COMPREHENSIVE METABOLIC PANEL
ALT: 14 U/L (ref 0–44)
AST: 12 U/L — ABNORMAL LOW (ref 15–41)
Albumin: 4.3 g/dL (ref 3.5–5.0)
Alkaline Phosphatase: 68 U/L (ref 38–126)
Anion gap: 9 (ref 5–15)
BUN: 10 mg/dL (ref 6–20)
CO2: 23 mmol/L (ref 22–32)
Calcium: 9.3 mg/dL (ref 8.9–10.3)
Chloride: 106 mmol/L (ref 98–111)
Creatinine, Ser: 0.72 mg/dL (ref 0.44–1.00)
GFR calc Af Amer: >60 mL/min (ref >60)
GFR calc non Af Amer: >60 mL/min (ref >60)
Glucose, Bld: 93 mg/dL (ref 70–99)
Potassium: 3.8 mmol/L (ref 3.5–5.1)
Sodium: 138 mmol/L (ref 135–145)
Total Bilirubin: 0.5 mg/dL (ref 0.3–1.2)
Total Protein: 7.1 g/dL (ref 6.5–8.1)

## 2019-07-20 LAB — CBC WITH DIFFERENTIAL/PLATELET
Abs Immature Granulocytes: 0.04 10*3/uL (ref 0.00–0.07)
Basophils Absolute: 0.1 10*3/uL (ref 0.0–0.1)
Basophils Relative: 1 %
Eosinophils Absolute: 0.5 10*3/uL (ref 0.0–0.5)
Eosinophils Relative: 4 %
HCT: 42.5 % (ref 36.0–46.0)
Hemoglobin: 14 g/dL (ref 12.0–15.0)
Immature Granulocytes: 0 %
Lymphocytes Relative: 23 %
Lymphs Abs: 2.8 10*3/uL (ref 0.7–4.0)
MCH: 29.3 pg (ref 26.0–34.0)
MCHC: 32.9 g/dL (ref 30.0–36.0)
MCV: 88.9 fL (ref 80.0–100.0)
Monocytes Absolute: 0.6 10*3/uL (ref 0.1–1.0)
Monocytes Relative: 5 %
Neutro Abs: 8.2 10*3/uL — ABNORMAL HIGH (ref 1.7–7.7)
Neutrophils Relative %: 67 %
Platelets: 182 10*3/uL (ref 150–400)
RBC: 4.78 MIL/uL (ref 3.87–5.11)
RDW: 14.1 % (ref 11.5–15.5)
WBC: 12.1 10*3/uL — ABNORMAL HIGH (ref 4.0–10.5)
nRBC: 0 % (ref 0.0–0.2)

## 2019-07-20 IMAGING — US US EXTREM LOW VENOUS*L*
1 series · 13 of 24 positions shown · non-contrast
Comparison: None.

CLINICAL DATA: Lower extremity pain and edema



[Series 1: us extrem low venous*left* · 13 of 31 slices shown]
[im 1/31]
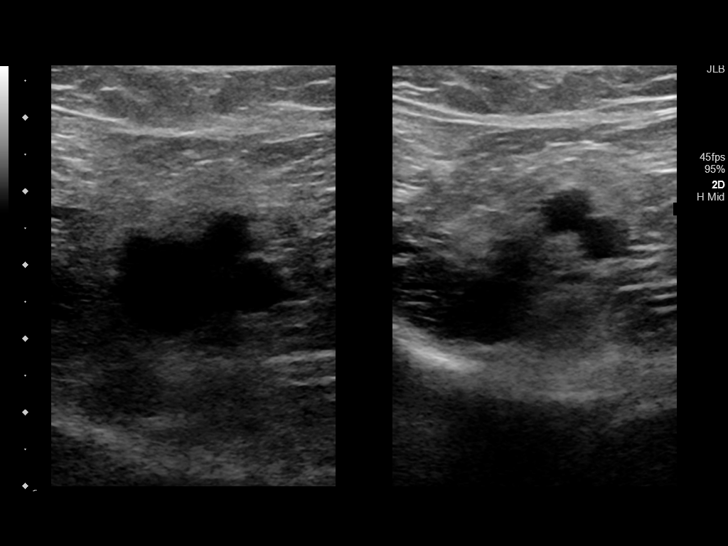
[im 3/31]
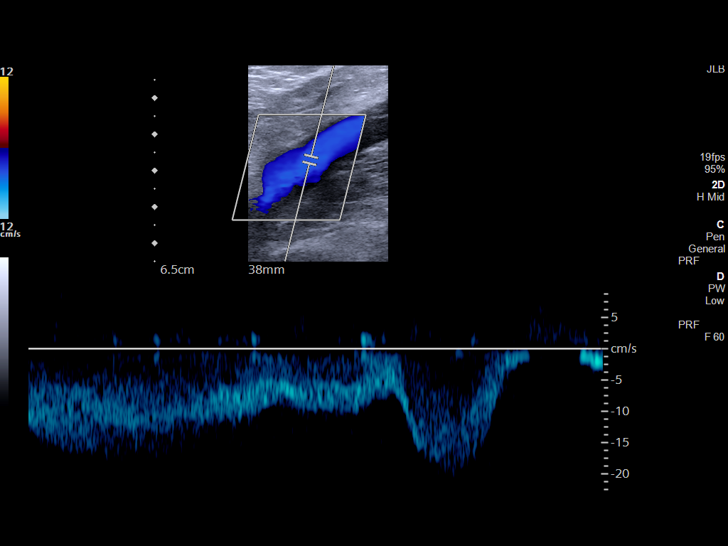
[im 6/31]
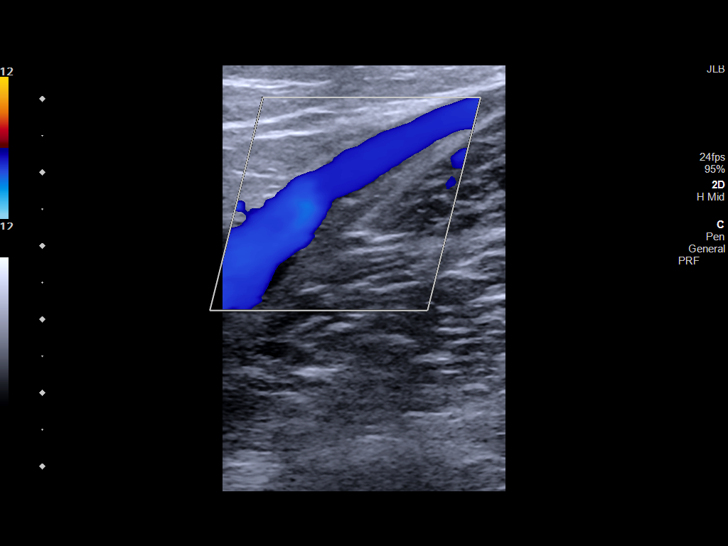
[im 8/31]
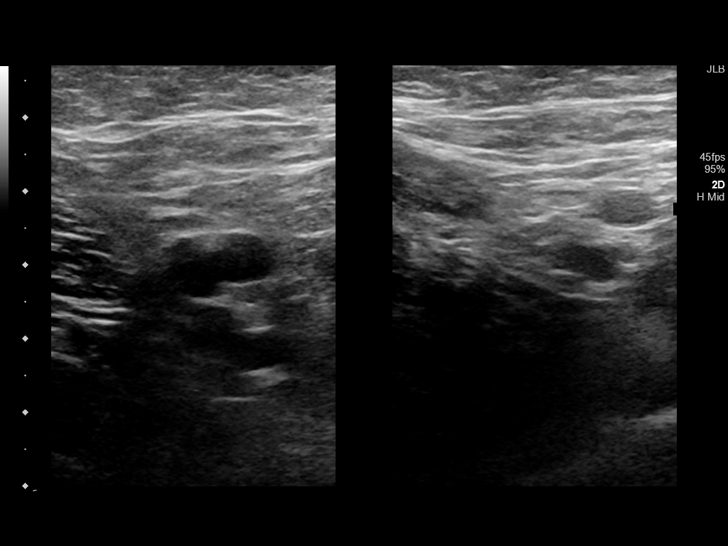
[im 11/31]
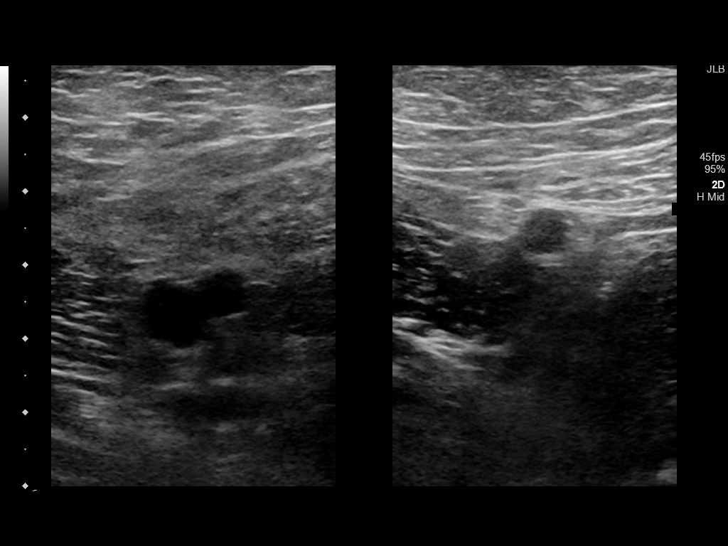
[im 14/31]
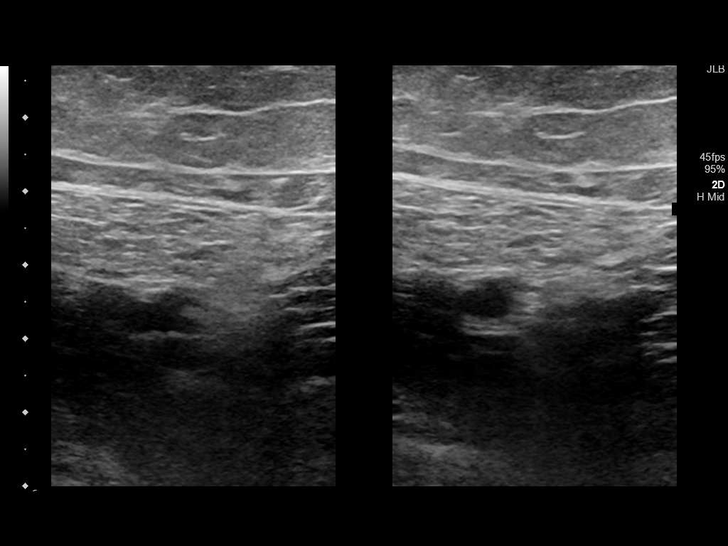
[im 16/31]
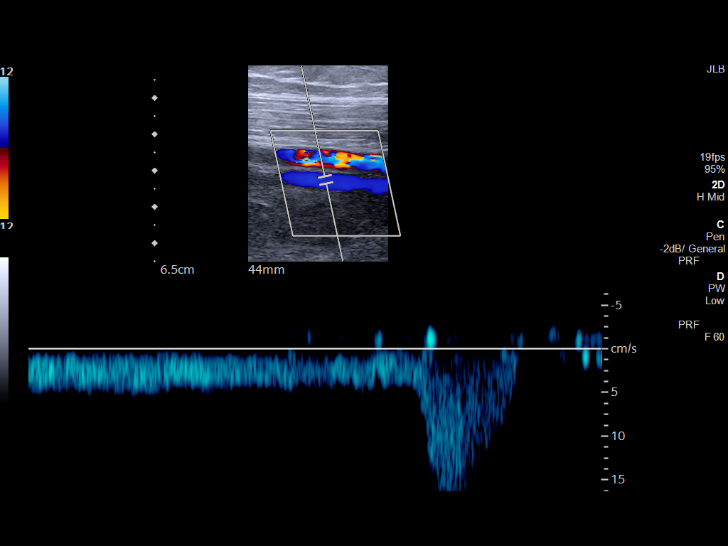
[im 17/31]
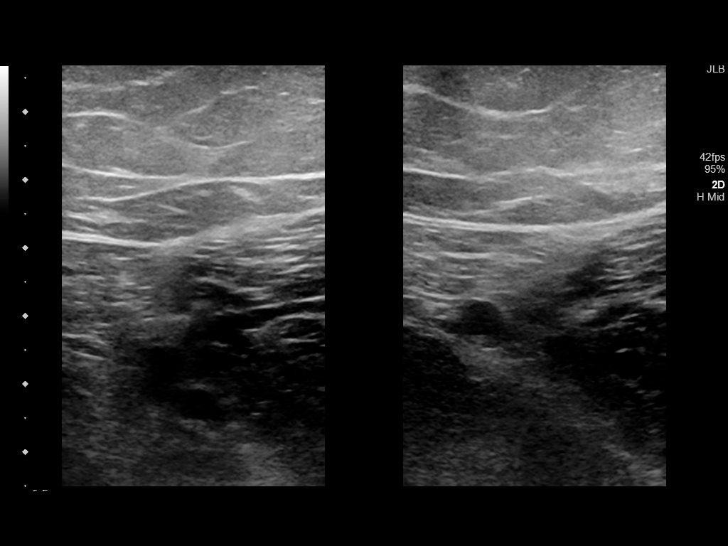
[im 20/31]
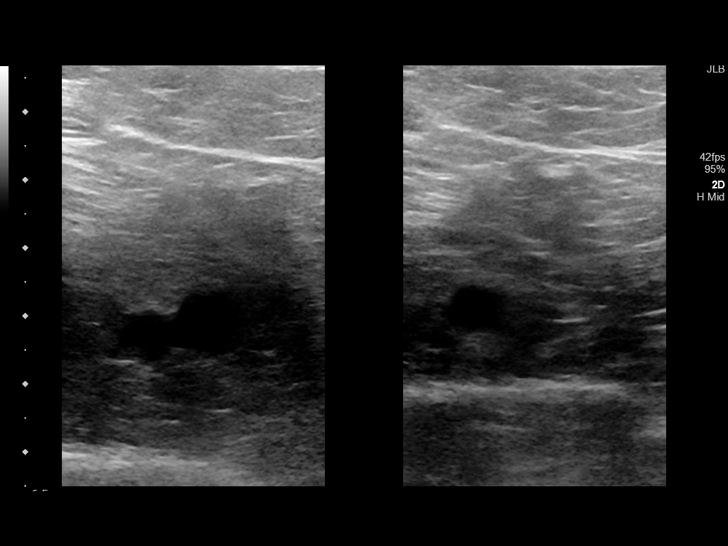
[im 23/31]
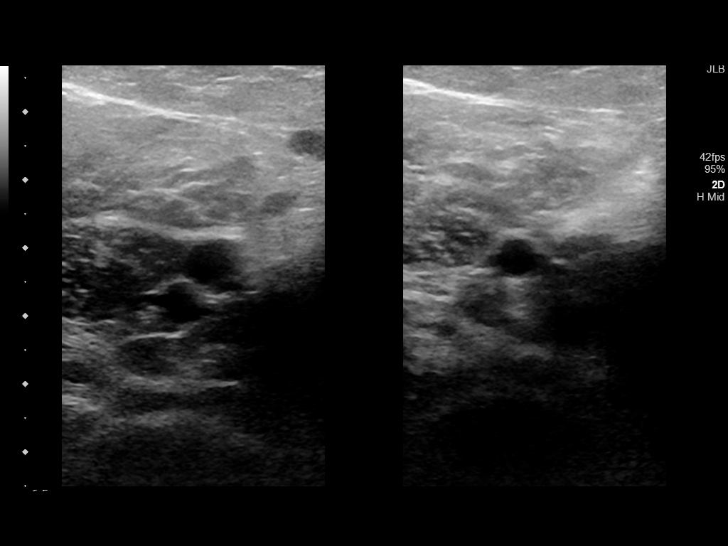
[im 25/31]
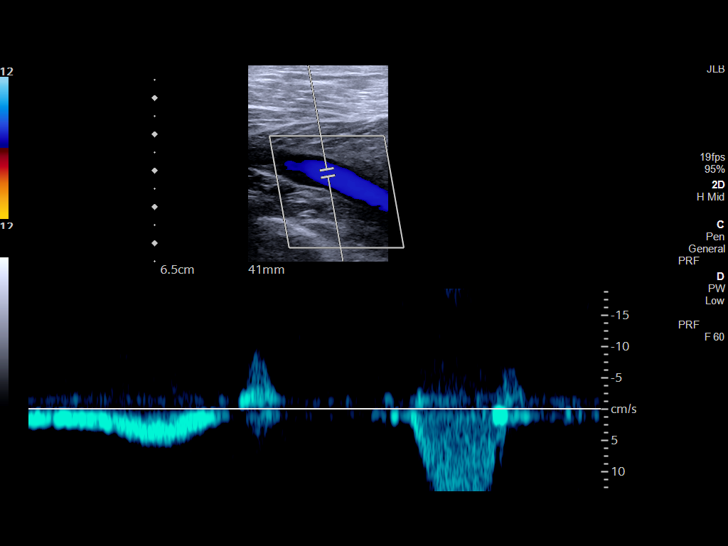
[im 28/31]
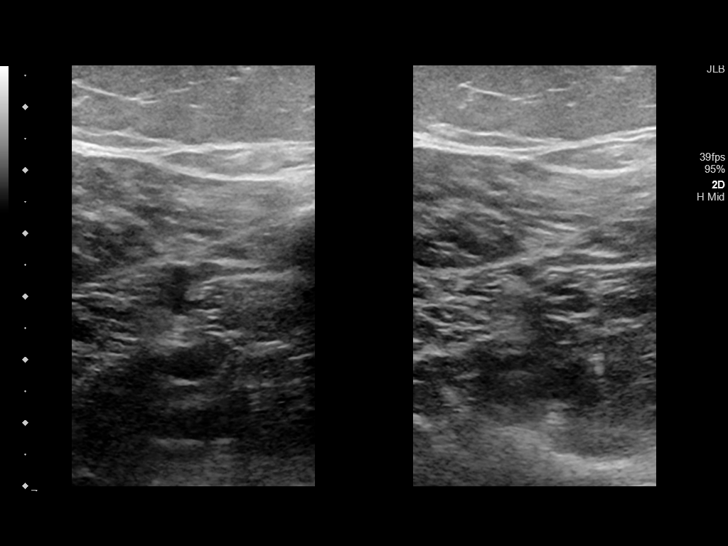
[im 31/31]
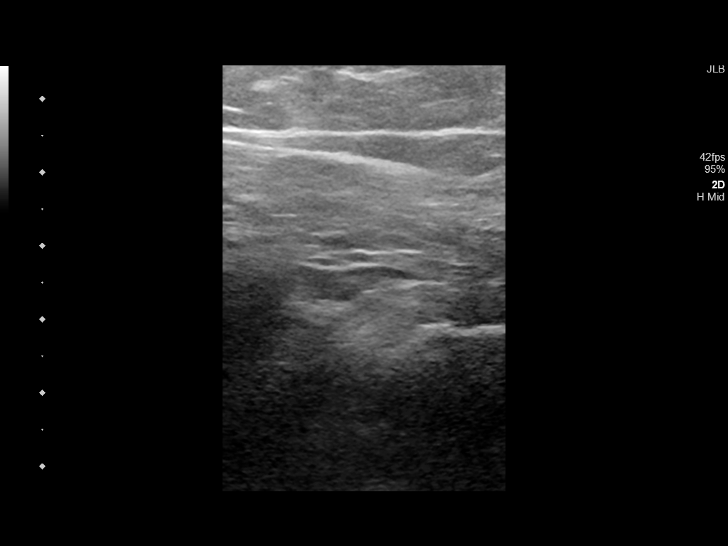

[13 of 24 positions shown; findings below may reference images not displayed]

FINDINGS: Contralateral Common Femoral Vein: Respiratory phasicity is normal
and symmetric with the symptomatic side. No evidence of thrombus.
Normal compressibility.

Common Femoral Vein: No evidence of thrombus. Normal
compressibility, respiratory phasicity and response to augmentation.

Saphenofemoral Junction: No evidence of thrombus. Normal
compressibility and flow on color Doppler imaging.

Profunda Femoral Vein: No evidence of thrombus. Normal
compressibility and flow on color Doppler imaging.

Femoral Vein: No evidence of thrombus. Normal compressibility,
respiratory phasicity and response to augmentation.

Popliteal Vein: No evidence of thrombus. Normal compressibility,
respiratory phasicity and response to augmentation.

Calf Veins: No evidence of thrombus. Normal compressibility and flow
on color Doppler imaging.

Superficial Great Saphenous Vein: No evidence of thrombus. Normal
compressibility.

Venous Reflux:  Not evaluated on this exam.

Other Findings:  None.
IMPRESSION: No evidence of deep venous thrombosis.

## 2019-07-20 MED ORDER — MELOXICAM 15 MG PO TABS: 15.0000 mg | ORAL_TABLET | Freq: Every day | ORAL | 1 refills | Status: AC

## 2019-07-20 NOTE — ED Triage Notes (Signed)
Pt in via POV, reports sudden onset left calf pain.  No redness, swelling noted.  Expresses concern for blood clot, denies hx of same.  NAD noted at this time.

## 2019-07-20 NOTE — ED Notes (Signed)
Pt has pain in left calf.  No known injury.  No swelling   No redness   Sx began today.

## 2019-07-20 NOTE — ED Provider Notes (Signed)
[facility] Emergency Department Provider Note  ____________________________________________  Time seen: Approximately 5:22 PM  I have reviewed the triage vital signs and the nursing notes.   HISTORY  Chief Complaint Leg Pain    HPI Yesenia Beasley is a 33 y.o. female presents to the emergency department with acute 8 out of 10 left calf pain that started at suddenly while patient was washing dishes.  Patient states that she has a family history of DVTs and became concerned for clot formation as patient has a history of obesity and takes birth control.  She denies daily smoking, prolonged immobilization or recent surgery.  No pleuritic chest pain or shortness of breath.  She denies falls or mechanisms of trauma.  She denies hearing a popping or snapping sensation.  No similar symptoms in the past.  No other alleviating measures have been attempted.        Past Medical History:  Diagnosis Date  . Anxiety   . Genetic testing 11/09/2017   Multi-Cancer panel (83 genes) @ [facility] - No pathogenic mutations detected  . Headache disorder   . History of ovarian cyst   . Pseudotumor cerebri     Patient Active Problem List   Diagnosis Date Noted  . Hemorrhagic ovarian cyst 04/27/2019  . Morbid obesity with BMI of 40.0-44.9, adult ([place]) 09/25/2018  . Gastroesophageal reflux disease 01/06/2018  . Genetic testing 11/09/2017  . Dysmenorrhea 06/25/2017  . Panic disorder 06/25/2017  . Pseudotumor cerebri 06/25/2017  . History of colon polyps 06/25/2017  . Depression, recurrent ([place]) 06/25/2017  . Anxiety 06/15/2017    Past Surgical History:  Procedure Laterality Date  . COLONOSCOPY WITH ESOPHAGOGASTRODUODENOSCOPY (EGD)    . COLONOSCOPY WITH PROPOFOL N/A 10/05/2017   Procedure: COLONOSCOPY WITH PROPOFOL;  Surgeon: Jonathon Bellows, MD;  Location: [facility] ENDOSCOPY;  Service: Gastroenterology;  Laterality: N/A;  . KNEE SURGERY Bilateral 2002,2003,2014    Prior to Admission  medications   Medication Sig Start Date End Date Taking? Authorizing Provider  Albuterol Sulfate (PROAIR RESPICLICK) 123XX123 (90 Base) MCG/ACT AEPB Inhale 2 puffs into the lungs every 4 (four) hours as needed. 06/02/19   Elby Beck, FNP  ALPRAZolam Duanne Moron) 0.5 MG tablet TAKE 1-2 TABLETS BY MOUTH TWICE DAILY AS NEEDED FOR ANXIETY 04/27/19   Elby Beck, FNP  cyclobenzaprine (FLEXERIL) 5 MG tablet Take 5 mg by mouth 3 (three) times daily as needed for muscle spasms.    [provider]  doxycycline (VIBRAMYCIN) 100 MG capsule Take 1 capsule (100 mg total) by mouth 2 (two) times daily for 7 days. 07/14/19 07/21/19  Loura Halt A, NP  meloxicam (MOBIC) 15 MG tablet Take 1 tablet (15 mg total) by mouth daily for 7 days. 07/20/19 07/27/19  Lannie Fields, PA-C  sertraline (ZOLOFT) 100 MG tablet Take 2 tablets (200 mg total) by mouth daily. 04/27/19   Elby Beck, FNP    Allergies Patient has no known allergies.  Family History  Problem Relation Age of Onset  . Leukemia Mother        CMML; dx 73s; deceased 84  . Heart disease Father   . Colon polyps Father        initially in 26s; #/type unknown  . Wilm's tumor Sister        deceased at 2  . Brain cancer Maternal Grandfather        astrocytoma; deceased 27  . Heart attack Paternal Grandfather        deceased at  75  . Thyroid cancer Maternal Uncle        dx 68s  . Non-Hodgkin's lymphoma Maternal Aunt     Social History Social History   Tobacco Use  . Smoking status: Current Every Day Smoker    Packs/day: 0.50    Types: Cigarettes  . Smokeless tobacco: Never Used  Substance Use Topics  . Alcohol use: Not Currently  . Drug use: No     Review of Systems  Constitutional: No fever/chills Eyes: No visual changes. No discharge ENT: No upper respiratory complaints. Cardiovascular: no chest pain. Respiratory: no cough. No SOB. Gastrointestinal: No abdominal pain.  No nausea, no vomiting.  No diarrhea.  No  constipation. Genitourinary: Negative for dysuria. No hematuria Musculoskeletal: Patient has left calf pain.  Skin: Negative for rash, abrasions, lacerations, ecchymosis. Neurological: Negative for headaches, focal weakness or numbness.   ____________________________________________   PHYSICAL EXAM:  VITAL SIGNS: ED Triage Vitals [07/20/19 1456]  Enc Vitals Group     BP 137/83     Pulse Rate 92     Resp 16     Temp 99.1 F (37.3 C)     Temp Source Oral     SpO2 96 %     Weight 280 lb (127 kg)     Height 5\' 5"  (1.651 m)     Head Circumference      Peak Flow      Pain Score 4     Pain Loc      Pain Edu?      Excl. in [place]?      Constitutional: Alert and oriented. Well appearing and in no acute distress. Eyes: Conjunctivae are normal. PERRL. EOMI. Head: Atraumatic. ENT: Cardiovascular: Normal rate, regular rhythm. Normal S1 and S2.  Good peripheral circulation. Respiratory: Normal respiratory effort without tachypnea or retractions. Lungs CTAB. Good air entry to the bases with no decreased or absent breath sounds. Gastrointestinal: Bowel sounds 4 quadrants. Soft and nontender to palpation. No guarding or rigidity. No palpable masses. No distention. No CVA tenderness. Musculoskeletal: Patient has left proximal calf pain to palpation.  No palpable deficit over the left Achilles tendon.  Patient can perform flexion and extension at left ankle.  Palpable dorsalis pedis pulse, left. Neurologic:  Normal speech and language. No gross focal neurologic deficits are appreciated.  Skin:  Skin is warm, dry and intact. No rash noted. Psychiatric: Mood and affect are normal. Speech and behavior are normal. Patient exhibits appropriate insight and judgement.   ____________________________________________   LABS (all labs ordered are listed, but only abnormal results are displayed)  Labs Reviewed  CBC WITH DIFFERENTIAL/PLATELET - Abnormal; Notable for the following components:       Result Value   WBC 12.1 (*)    Neutro Abs 8.2 (*)    All other components within normal limits  COMPREHENSIVE METABOLIC PANEL - Abnormal; Notable for the following components:   AST 12 (*)    All other components within normal limits   ____________________________________________  EKG   ____________________________________________  RADIOLOGY I personally viewed and evaluated these images as part of my medical decision making, as well as reviewing the written report by the radiologist.  US Venous Img Lower Unilateral Left (dvt)  Result Date: 07/20/2019 CLINICAL DATA:  Lower extremity pain and edema EXAM: LEFT LOWER EXTREMITY VENOUS DOPPLER ULTRASOUND TECHNIQUE: Gray-scale sonography with graded compression, as well as color Doppler and duplex ultrasound were performed to evaluate the lower extremity deep venous systems from the  level of the common femoral vein and including the common femoral, femoral, profunda femoral, popliteal and calf veins including the posterior tibial, peroneal and gastrocnemius veins when visible. The superficial great saphenous vein was also interrogated. Spectral Doppler was utilized to evaluate flow at rest and with distal augmentation maneuvers in the common femoral, femoral and popliteal veins. COMPARISON:  None. FINDINGS: Contralateral Common Femoral Vein: Respiratory phasicity is normal and symmetric with the symptomatic side. No evidence of thrombus. Normal compressibility. Common Femoral Vein: No evidence of thrombus. Normal compressibility, respiratory phasicity and response to augmentation. Saphenofemoral Junction: No evidence of thrombus. Normal compressibility and flow on color Doppler imaging. Profunda Femoral Vein: No evidence of thrombus. Normal compressibility and flow on color Doppler imaging. Femoral Vein: No evidence of thrombus. Normal compressibility, respiratory phasicity and response to augmentation. Popliteal Vein: No evidence of thrombus. Normal  compressibility, respiratory phasicity and response to augmentation. Calf Veins: No evidence of thrombus. Normal compressibility and flow on color Doppler imaging. Superficial Great Saphenous Vein: No evidence of thrombus. Normal compressibility. Venous Reflux:  Not evaluated on this exam. Other Findings:  None. IMPRESSION: No evidence of deep venous thrombosis. Electronically Signed   By: Constance Holster M.D.   On: 07/20/2019 15:55    ____________________________________________    PROCEDURES  Procedure(s) performed:    Procedures    Medications - No data to display   ____________________________________________   INITIAL IMPRESSION / ASSESSMENT AND PLAN / ED COURSE  Pertinent labs & imaging results that were available during my care of the patient were reviewed by me and considered in my medical decision making (see chart for details).  Review of the  CSRS was performed in accordance of the [place] prior to dispensing any controlled drugs.        Assessment and plan Calf pain 34 year old female presents to the emergency department with acute left calf pain that occurred suddenly while patient was doing dishes.  Vital signs were reassuring at triage.  On physical exam, patient had tenderness over the proximal aspect of the left calf.  There was no tenderness or pain over the distal left calf but no palpable deficit over the Achilles tendon insertion.  There was no erythema or edema of the left lower extremity and no abrasions.  Differential diagnosis originally included cellulitis, DVT, plantaris rupture, gastrocnemius strain...  There was no erythema or edema of the left calf, decreasing suspicion for cellulitis.  No evidence of DVT on venous ultrasound.  Patient's sudden onset of pain and tenderness along the proximal calf increases suspicion for possible plantaris rupture.  Recommended rest, ice, compression elevation at home.  Started patient on meloxicam and advised her  to follow-up with primary care if symptoms persist.  All patient questions were answered.   ____________________________________________  FINAL CLINICAL IMPRESSION(S) / ED DIAGNOSES  Final diagnoses:  Pain of left calf      NEW MEDICATIONS STARTED DURING THIS VISIT:  ED Discharge Orders         Ordered    meloxicam (MOBIC) 15 MG tablet  Daily     07/20/19 1711              This chart was dictated using voice recognition software/Dragon. Despite best efforts to proofread, errors can occur which can change the meaning. Any change was purely unintentional.    Lannie Fields, PA-C 07/20/19 1726    Harvest Dark, MD 07/20/19 2116

## 2019-07-22 ENCOUNTER — Other Ambulatory Visit (HOSPITAL_COMMUNITY)
Admission: RE | Admit: 2019-07-22 | Discharge: 2019-07-22 | Disposition: A | Discharge status: Home / Self Care | Payer: 59 | Source: Ambulatory Visit | Attending: Family Medicine | Admitting: Family Medicine

## 2019-07-22 ENCOUNTER — Ambulatory Visit (INDEPENDENT_AMBULATORY_CARE_PROVIDER_SITE_OTHER): Payer: 59 | Admitting: Family Medicine

## 2019-07-22 ENCOUNTER — Other Ambulatory Visit: Payer: Self-pay

## 2019-07-22 ENCOUNTER — Encounter: Payer: Self-pay | Admitting: Family Medicine

## 2019-07-22 VITALS — BP 122/78 | HR 78 | Temp 98.1°F | Ht 65.5 in | Wt 287.0 lb

## 2019-07-22 DIAGNOSIS — Z0000 Encounter for general adult medical examination without abnormal findings: Secondary | ICD-10-CM | POA: Diagnosis not present

## 2019-07-22 DIAGNOSIS — Z124 Encounter for screening for malignant neoplasm of cervix: Secondary | ICD-10-CM | POA: Diagnosis not present

## 2019-07-22 DIAGNOSIS — E7800 Pure hypercholesterolemia, unspecified: Secondary | ICD-10-CM

## 2019-07-22 DIAGNOSIS — G8929 Other chronic pain: Secondary | ICD-10-CM

## 2019-07-22 DIAGNOSIS — F410 Panic disorder [episodic paroxysmal anxiety] without agoraphobia: Secondary | ICD-10-CM | POA: Diagnosis not present

## 2019-07-22 DIAGNOSIS — M25562 Pain in left knee: Secondary | ICD-10-CM

## 2019-07-22 DIAGNOSIS — Z6841 Body Mass Index (BMI) 40.0 and over, adult: Secondary | ICD-10-CM

## 2019-07-22 MED ORDER — ATORVASTATIN CALCIUM 20 MG PO TABS: 20.0000 mg | ORAL_TABLET | Freq: Every day | ORAL | 3 refills | Status: DC

## 2019-07-22 NOTE — Patient Instructions (Addendum)
Good to see you today  Follow up in 3 months  Start atorvastatin low dose  For psychiatry, call [facility] in [place], [place], [facility] [phone]  A resource that I like is [email or web address].com/diabetes/diet  Here are some guidelines to help you with meal planning -  Avoid all processed and packaged foods (bread, pasta, crackers, chips, etc) and beverages containing calories.  Avoid added sugars and excessive natural sugars.  Attention to how you feel if you consume artificial sweeteners.  Do they make you more hungry or raise your blood sugar?  With every meal and snack, aim to get 20 g of protein (3 ounces of meat, 4 ounces of fish, 3 eggs, protein powder, 1 cup [place] yogurt, 1 cup cottage cheese, etc.)  Increase fiber in the form of non-starchy vegetables.  These help you feel full with very little carbohydrates and are good for gut health.  Eat 1 serving healthy carb per meal- 1/2 cup brown rice, beans, potato, corn- pay attention to whether or not this significantly raises your blood sugar. If it does, reduce the frequency you consume these.   Eat 2-3 servings of lower sugar fruits daily.  This includes berries, apples, oranges, peaches, pears, one half banana.  Have small amounts of good fats such as avocado, nuts, olive oil, nut butters, olives.  Add a little cheese to your salads to make them tasty.

## 2019-07-22 NOTE — Progress Notes (Signed)
Established Patient Office Visit  Subjective:  Patient ID: Yesenia Beasley, female    DOB: 1986/06/24  Age: 33 y.o. MRN: [id number]  CC:  Chief Complaint  Patient presents with  . Annual Exam    Discuss Psych referral / Discuss labs    HPI Yesenia Beasley presents for her annual physical. Pt reports she has chronic left knee pain and would like to have referral to orthopedics. Pt also reports that she still struggles with anxiety and panic attacks. Herr Zoloft dose was increased and she feels slight ly better. She needs a new referral to psychiatrist.    Past Medical History:  Diagnosis Date  . Anxiety   . Genetic testing 11/09/2017   Multi-Cancer panel (83 genes) @ [facility] - No pathogenic mutations detected  . Headache disorder   . History of ovarian cyst   . Pseudotumor cerebri     Past Surgical History:  Procedure Laterality Date  . COLONOSCOPY WITH ESOPHAGOGASTRODUODENOSCOPY (EGD)    . COLONOSCOPY WITH PROPOFOL N/A 10/05/2017   Procedure: COLONOSCOPY WITH PROPOFOL;  Surgeon: Jonathon Bellows, MD;  Location: [facility] ENDOSCOPY;  Service: Gastroenterology;  Laterality: N/A;  . KNEE SURGERY Bilateral 2002,2003,2014    Family History  Problem Relation Age of Onset  . Leukemia Mother        CMML; dx 55s; deceased 55  . Heart disease Father   . Colon polyps Father        initially in 30s; #/type unknown  . Wilm's tumor Sister        deceased at 2  . Brain cancer Maternal Grandfather        astrocytoma; deceased 55  . Heart attack Paternal Grandfather        deceased at 76  . Thyroid cancer Maternal Uncle        dx 71s  . Non-Hodgkin's lymphoma Maternal Aunt     Social History   Socioeconomic History  . Marital status: Divorced    Spouse name: Not on file  . Number of children: Not on file  . Years of education: Not on file  . Highest education level: Not on file  Occupational History  . Not on file  Social Needs  . Financial resource strain: Not on file  . Food insecurity    Worry: Not on file    Inability: Not on file  . Transportation needs    Medical: Not on file    Non-medical: Not on file  Tobacco Use  . Smoking status: Current Every Day Smoker    Packs/day: 0.50    Types: Cigarettes  . Smokeless tobacco: Never Used  Substance and Sexual Activity  . Alcohol use: Not Currently  . Drug use: No  . Sexual activity: Yes  Lifestyle  . Physical activity    Days per week: Not on file    Minutes per session: Not on file  . Stress: Not on file  Relationships  . Social Herbalist on phone: Not on file    Gets together: Not on file    Attends religious service: Not on file    Active member of club or organization: Not on file    Attends meetings of clubs or organizations: Not on file    Relationship status: Not on file  . Intimate partner violence    Fear of current or ex partner: Not on file    Emotionally abused: Not on file    Physically abused: Not on file  Forced sexual activity: Not on file  Other Topics Concern  . Not on file  Social History Narrative  . Not on file    Outpatient Medications Prior to Visit  Medication Sig Dispense Refill  . Albuterol Sulfate (PROAIR RESPICLICK) 123XX123 (90 Base) MCG/ACT AEPB Inhale 2 puffs into the lungs every 4 (four) hours as needed. 1 each 2  . ALPRAZolam (XANAX) 0.5 MG tablet TAKE 1-2 TABLETS BY MOUTH TWICE DAILY AS NEEDED FOR ANXIETY 60 tablet 2  . cyclobenzaprine (FLEXERIL) 5 MG tablet Take 5 mg by mouth 3 (three) times daily as needed for muscle spasms.    . meloxicam (MOBIC) 15 MG tablet Take 1 tablet (15 mg total) by mouth daily for 7 days. 30 tablet 1  . sertraline (ZOLOFT) 100 MG tablet Take 2 tablets (200 mg total) by mouth daily. 180 tablet 1   No facility-administered medications prior to visit.     No Known Allergies  ROS Review of Systems  Constitutional: Negative for activity change, fatigue and fever.  HENT: Negative for ear pain.   Eyes: Negative for pain and visual  disturbance.  Respiratory: Negative for cough, chest tightness, shortness of breath and wheezing.   Gastrointestinal: Negative for abdominal pain, constipation, diarrhea, nausea and vomiting.  Genitourinary: Negative for difficulty urinating.  Neurological: Negative for headaches.      Objective:    BP 122/78 (BP Location: Left Arm, Patient Position: Sitting, Cuff Size: Normal)   Pulse 78   Temp 98.1 F (36.7 C) (Temporal)   Ht 5' 5.5" (1.664 m)   Wt 130.2 kg   SpO2 96%   BMI 47.03 kg/m    Wt Readings from Last 3 Encounters:  07/22/19 130.2 kg  07/20/19 127 kg  04/27/19 121.6 kg   Physical Exam  Constitutional: She is oriented to person, place, and time. She appears well-developed and well-nourished.  HENT:  Head: Normocephalic and atraumatic.  Eyes: Conjunctivae and EOM are normal.  Neck: Normal range of motion.  Cardiovascular: Normal rate, regular rhythm and normal heart sounds.  Pulmonary/Chest: Effort normal and breath sounds normal.  Abdominal: Soft. Bowel sounds are normal.  Musculoskeletal: Normal range of motion.  Neurological: She is alert and oriented to person, place, and time.  Skin: Skin is warm and dry.  Psychiatric: She has a normal mood and affect.    There were no vitals taken for this visit. Wt Readings from Last 3 Encounters:  07/20/19 127 kg  04/27/19 121.6 kg  10/20/18 117.6 kg     Health Maintenance Due  Topic Date Due  . HIV Screening  04/22/1999  . TETANUS/TDAP  04/22/2003    There are no preventive care reminders to display for this patient.  Lab Results  Component Value Date   TSH 4.132 06/12/2018   Lab Results  Component Value Date   WBC 12.1 (H) 07/20/2019   HGB 14.0 07/20/2019   HCT 42.5 07/20/2019   MCV 88.9 07/20/2019   PLT 182 07/20/2019   Lab Results  Component Value Date   NA 138 07/20/2019   K 3.8 07/20/2019   CO2 23 07/20/2019   GLUCOSE 93 07/20/2019   BUN 10 07/20/2019   CREATININE 0.72 07/20/2019    BILITOT 0.5 07/20/2019   ALKPHOS 68 07/20/2019   AST 12 (L) 07/20/2019   ALT 14 07/20/2019   PROT 7.1 07/20/2019   ALBUMIN 4.3 07/20/2019   CALCIUM 9.3 07/20/2019   ANIONGAP 9 07/20/2019   GFR 79.25 03/26/2018   Lab  Results  Component Value Date   CHOL 181 07/18/2019   Lab Results  Component Value Date   HDL 26.60 (L) 07/18/2019   Lab Results  Component Value Date   LDLCALC 129 (H) 07/18/2019   Lab Results  Component Value Date   TRIG 126.0 07/18/2019   Lab Results  Component Value Date   CHOLHDL 7 07/18/2019   Lab Results  Component Value Date   HGBA1C 5.5 07/18/2019      Assessment & Plan:  1. Elevated LDL cholesterol level Increased cholesterol level.. Pt agrees to start taking it. - atorvastatin (LIPITOR) 20 MG tablet; Take 1 tablet (20 mg total) by mouth daily.  Dispense: 90 tablet; Refill: 3  2. Screening for cervical cancer - Cytology - PAP([place])  Problem List Items Addressed This Visit    None      No orders of the defined types were placed in this encounter.   Follow-up: No follow-ups on file.    Rica Koyanagi, RN

## 2019-07-22 NOTE — Progress Notes (Signed)
Subjective:    Patient ID: Yesenia Beasley, female    DOB: Apr 13, 1986, 33 y.o.   MRN: [id number]  HPI This is a 33 yo female who presents today for CPE.    Last CPE- 07/2018 Pap- 07/14/2018 Colonoscopy- 10/05/17 Flu- annual Eye- regular Dental- regular Exercise- some walking, just got a treadmill  Left calf pain- sudden onset, went to ER, had negative doppler US. Worsening left knee pain. Had multiple surgeries.   Panic/depression- depression improved on increased sertraline but has gained quite a bit of weight. Continues with panic attacks. Was referred to psychiatry but [facility] contacted her and told her that she would have to agree to come off of her alprazolam for them to see her. This made her very uncomfortable and so she declined appointment. Unfortunately, she was not given other options for psychiatry so she has not seen anyone. She has been taking 1-2 alprazolam 0.5 mg daily.   Hyperlipidemia- patient agreeable to starting statin for elevated LDL and low HDL.   Past Medical History:  Diagnosis Date  . Anxiety   . Genetic testing 11/09/2017   Multi-Cancer panel (83 genes) @ [facility] - No pathogenic mutations detected  . Headache disorder   . History of ovarian cyst   . Pseudotumor cerebri    Past Surgical History:  Procedure Laterality Date  . COLONOSCOPY WITH ESOPHAGOGASTRODUODENOSCOPY (EGD)    . COLONOSCOPY WITH PROPOFOL N/A 10/05/2017   Procedure: COLONOSCOPY WITH PROPOFOL;  Surgeon: Jonathon Bellows, MD;  Location: [facility] ENDOSCOPY;  Service: Gastroenterology;  Laterality: N/A;  . KNEE SURGERY Bilateral 2002,2003,2014   Family History  Problem Relation Age of Onset  . Leukemia Mother        CMML; dx 41s; deceased 7  . Heart disease Father   . Colon polyps Father        initially in 79s; #/type unknown  . Wilm's tumor Sister        deceased at 2  . Brain cancer Maternal Grandfather        astrocytoma; deceased 63  . Heart attack Paternal Grandfather    deceased at 20  . Thyroid cancer Maternal Uncle        dx 20s  . Non-Hodgkin's lymphoma Maternal Aunt    Social History   Tobacco Use  . Smoking status: Current Every Day Smoker    Packs/day: 0.50    Types: Cigarettes  . Smokeless tobacco: Never Used  Substance Use Topics  . Alcohol use: Not Currently  . Drug use: No      Review of Systems  Constitutional: Positive for fatigue and unexpected weight change (increased since starting increased sertraline).  HENT: Negative.   Eyes: Negative.   Respiratory: Negative.   Cardiovascular: Negative.   Genitourinary: Positive for menstrual problem (heavy periods, got iud last month, now with spotting).  Musculoskeletal:       Worsening left knee pain, requests ortho referral.   Skin:       Recurrent superficial sores of breasts. Ingrown hairs, plucks hairs.   Allergic/Immunologic: Negative.   Neurological: Negative.   Hematological: Negative.   Psychiatric/Behavioral: Positive for sleep disturbance. The patient is nervous/anxious.        Objective:   Physical Exam Vitals signs reviewed. Exam conducted with a chaperone present Kathe Becton, NP student).  Constitutional:      General: She is not in acute distress.    Appearance: Normal appearance. She is obese. She is not ill-appearing, toxic-appearing or diaphoretic.  HENT:  Head: Normocephalic and atraumatic.     Right Ear: External ear normal.     Left Ear: External ear normal.  Eyes:     Conjunctiva/sclera: Conjunctivae normal.  Neck:     Musculoskeletal: Normal range of motion and neck supple.  Cardiovascular:     Rate and Rhythm: Normal rate and regular rhythm.     Heart sounds: Normal heart sounds.  Pulmonary:     Effort: Pulmonary effort is normal.     Breath sounds: Normal breath sounds.  Abdominal:     General: Abdomen is flat. Bowel sounds are normal. There is no distension.     Palpations: Abdomen is soft. There is no mass.     Tenderness: There is no  abdominal tenderness. There is no guarding or rebound.     Hernia: No hernia is present. There is no hernia in the left inguinal area or right inguinal area.  Genitourinary:    General: Normal vulva.     Exam position: Supine.     Labia:        Right: No rash, tenderness, lesion or injury.        Left: No rash, tenderness, lesion or injury.      Urethra: No prolapse, urethral pain, urethral swelling or urethral lesion.     Vagina: Normal.     Cervix: Cervical bleeding (small amount) present.     Comments: IUD strings visible. Pap obtained.  Musculoskeletal:     Right lower leg: No edema.     Left lower leg: No edema.  Lymphadenopathy:     Lower Body: No right inguinal adenopathy. No left inguinal adenopathy.  Skin:    General: Skin is warm and dry.     Comments: Bilateral breasts with 2-3 small, superficial open areas. No drainage.  Buttocks with hyperpigmented (healed) lesions.   Neurological:     Mental Status: She is alert and oriented to person, place, and time.  Psychiatric:        Mood and Affect: Mood normal.        Behavior: Behavior normal.        Thought Content: Thought content normal.        Judgment: Judgment normal.       BP 122/78 (BP Location: Left Arm, Patient Position: Sitting, Cuff Size: Normal)   Pulse 78   Temp 98.1 F (36.7 C) (Temporal)   Ht 5' 5.5" (1.664 m)   Wt 287 lb (130.2 kg)   SpO2 96%   BMI 47.03 kg/m  Wt Readings from Last 3 Encounters:  07/22/19 287 lb (130.2 kg)  07/20/19 280 lb (127 kg)  04/27/19 268 lb (121.6 kg)   BP Readings from Last 3 Encounters:  07/22/19 122/78  07/20/19 137/83  10/20/18 124/79   Depression screen PHQ 2/9 07/22/2019 04/27/2019 07/14/2018 01/06/2018 06/24/2017  Decreased Interest 0 1 0 1 1  Down, Depressed, Hopeless 0 1 0 1 1  PHQ - 2 Score 0 2 0 2 2  Altered sleeping - 3 - 3 2  Tired, decreased energy - 1 - 2 2  Change in appetite - 0 - 2 2  Feeling bad or failure about yourself  - 0 - 0 1  Trouble  concentrating - 0 - 1 2  Moving slowly or fidgety/restless - 0 - 1 0  Suicidal thoughts - 0 - 0 0  PHQ-9 Score - 6 - 11 11  Difficult doing work/chores - Somewhat difficult - Somewhat difficult -  Assessment & Plan:  1. Annual physical exam - Discussed and encouraged healthy lifestyle choices- adequate sleep, regular exercise, stress management and healthy food choices.   2. Elevated LDL cholesterol level - she will also work on diet - atorvastatin (LIPITOR) 20 MG tablet; Take 1 tablet (20 mg total) by mouth daily.  Dispense: 90 tablet; Refill: 3  3. Screening for cervical cancer - Cytology - PAP([place])  4. Panic disorder - provided her contact information for additional psychiatric practices and she will call to schedule  5. Morbid obesity with BMI of 45.0-49.9, adult ([place]) - weight gain with increased sertraline dose, provided brief diet counseling and provided written information for balanced diet  - increased exercise as tolerated  6. Chronic pain of left knee - Ambulatory referral to Orthopedic Surgery  - follow up in 3 months  Clarene Reamer, FNP-BC  [facility] at [facility] [place], [place]  07/23/2019 6:55 AM

## 2019-07-23 DIAGNOSIS — E7800 Pure hypercholesterolemia, unspecified: Secondary | ICD-10-CM | POA: Insufficient documentation

## 2019-07-27 LAB — CYTOLOGY - PAP
Comment: NEGATIVE
Diagnosis: NEGATIVE
High risk HPV: NEGATIVE

## 2019-07-30 ENCOUNTER — Other Ambulatory Visit: Payer: Self-pay | Admitting: Family Medicine

## 2019-07-30 DIAGNOSIS — F410 Panic disorder [episodic paroxysmal anxiety] without agoraphobia: Secondary | ICD-10-CM

## 2019-08-01 NOTE — Telephone Encounter (Signed)
Last OV 07/22/2019 CPE Last filled 04/27/19 #60 x 2 refills. Upcoming visit 10/28/2019 with Tor Netters, FNP

## 2019-09-02 ENCOUNTER — Other Ambulatory Visit: Payer: Self-pay | Admitting: Family Medicine

## 2019-09-02 DIAGNOSIS — J4520 Mild intermittent asthma, uncomplicated: Secondary | ICD-10-CM

## 2019-09-03 ENCOUNTER — Encounter: Payer: Self-pay | Admitting: Family Medicine

## 2019-09-05 ENCOUNTER — Other Ambulatory Visit: Payer: Self-pay | Admitting: Family Medicine

## 2019-09-05 DIAGNOSIS — J4520 Mild intermittent asthma, uncomplicated: Secondary | ICD-10-CM

## 2019-09-05 MED ORDER — ALBUTEROL SULFATE HFA 108 (90 BASE) MCG/ACT IN AERS: 2.0000 | INHALATION_SPRAY | RESPIRATORY_TRACT | 1 refills | Status: DC | PRN

## 2019-09-05 NOTE — Telephone Encounter (Signed)
Ok to switch? Please review. Thank you

## 2019-09-18 ENCOUNTER — Encounter: Payer: Self-pay | Admitting: Emergency Medicine

## 2019-09-18 ENCOUNTER — Emergency Department: Payer: 59

## 2019-09-18 ENCOUNTER — Other Ambulatory Visit: Payer: Self-pay

## 2019-09-18 ENCOUNTER — Telehealth: Payer: 59 | Admitting: Family

## 2019-09-18 ENCOUNTER — Emergency Department
Admission: EM | Admit: 2019-09-18 | Discharge: 2019-09-18 | Disposition: A | Discharge status: Home / Self Care | Payer: 59 | Attending: Emergency Medicine | Admitting: Emergency Medicine

## 2019-09-18 DIAGNOSIS — N390 Urinary tract infection, site not specified: Secondary | ICD-10-CM

## 2019-09-18 DIAGNOSIS — R109 Unspecified abdominal pain: Secondary | ICD-10-CM | POA: Diagnosis present

## 2019-09-18 DIAGNOSIS — R399 Unspecified symptoms and signs involving the genitourinary system: Secondary | ICD-10-CM

## 2019-09-18 DIAGNOSIS — N134 Hydroureter: Secondary | ICD-10-CM | POA: Diagnosis not present

## 2019-09-18 DIAGNOSIS — Z79899 Other long term (current) drug therapy: Secondary | ICD-10-CM | POA: Insufficient documentation

## 2019-09-18 DIAGNOSIS — N201 Calculus of ureter: Secondary | ICD-10-CM | POA: Insufficient documentation

## 2019-09-18 DIAGNOSIS — N200 Calculus of kidney: Secondary | ICD-10-CM

## 2019-09-18 DIAGNOSIS — F17210 Nicotine dependence, cigarettes, uncomplicated: Secondary | ICD-10-CM | POA: Insufficient documentation

## 2019-09-18 LAB — CBC
HCT: 43.7 % (ref 36.0–46.0)
Hemoglobin: 14.4 g/dL (ref 12.0–15.0)
MCH: 29.3 pg (ref 26.0–34.0)
MCHC: 33 g/dL (ref 30.0–36.0)
MCV: 89 fL (ref 80.0–100.0)
Platelets: 182 10*3/uL (ref 150–400)
RBC: 4.91 MIL/uL (ref 3.87–5.11)
RDW: 13.9 % (ref 11.5–15.5)
WBC: 13.3 10*3/uL — ABNORMAL HIGH (ref 4.0–10.5)
nRBC: 0 % (ref 0.0–0.2)

## 2019-09-18 LAB — URINALYSIS, COMPLETE (UACMP) WITH MICROSCOPIC
Bilirubin Urine: NEGATIVE
Glucose, UA: NEGATIVE mg/dL
Ketones, ur: NEGATIVE mg/dL
Leukocytes,Ua: NEGATIVE
Nitrite: POSITIVE — AB
Protein, ur: 30 mg/dL — AB
RBC / HPF: >50 RBC/hpf — ABNORMAL HIGH (ref 0–5)
Specific Gravity, Urine: 1.017 (ref 1.005–1.030)
pH: 6 (ref 5.0–8.0)

## 2019-09-18 LAB — BASIC METABOLIC PANEL
Anion gap: 8 (ref 5–15)
BUN: 9 mg/dL (ref 6–20)
CO2: 24 mmol/L (ref 22–32)
Calcium: 8.8 mg/dL — ABNORMAL LOW (ref 8.9–10.3)
Chloride: 105 mmol/L (ref 98–111)
Creatinine, Ser: 0.72 mg/dL (ref 0.44–1.00)
GFR calc Af Amer: >60 mL/min (ref >60)
GFR calc non Af Amer: >60 mL/min (ref >60)
Glucose, Bld: 99 mg/dL (ref 70–99)
Potassium: 3.8 mmol/L (ref 3.5–5.1)
Sodium: 137 mmol/L (ref 135–145)

## 2019-09-18 IMAGING — CT CT RENAL STONE PROTOCOL
2 of 4 series · 16 of 46 positions shown, 18 images · non-contrast
Comparison: [DATE]

CLINICAL DATA: Left flank pain

EXAM:
CT ABDOMEN AND PELVIS WITHOUT CONTRAST
TECHNIQUE: Multidetector CT imaging of the abdomen and pelvis was performed
following the standard protocol without IV contrast.

[Series 2: stone full standard · axial · 0.80mm/px · z∈[-574,-134]mm · 13 of 98 slices shown, 15 images]
[im 5/98  soft-tissue]
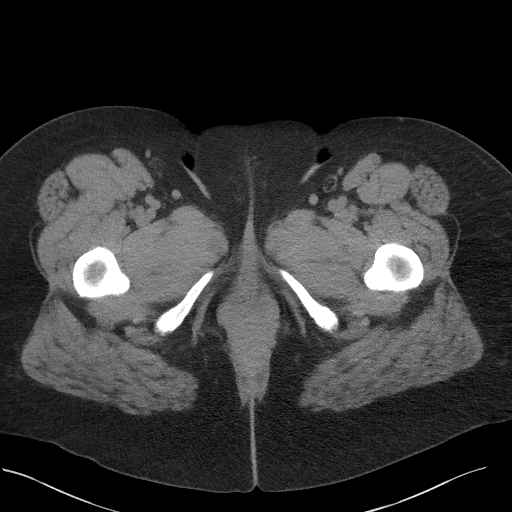
[im 5/98  bone]
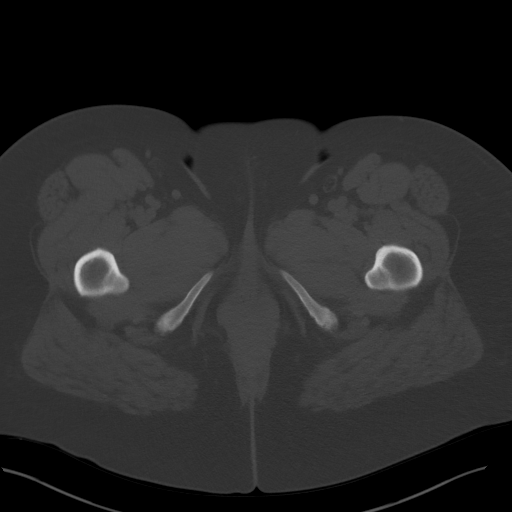
[im 13/98  soft-tissue]
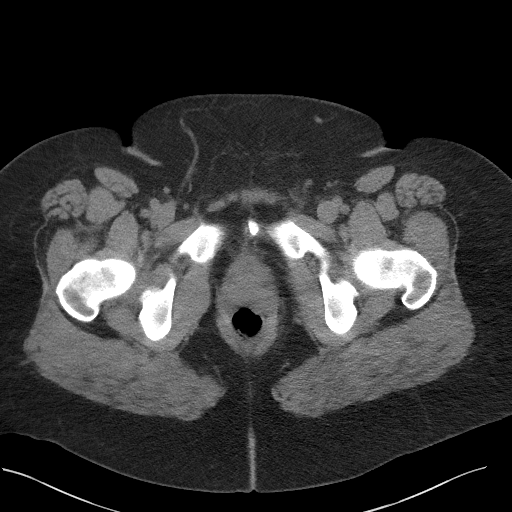
[im 22/98  soft-tissue]
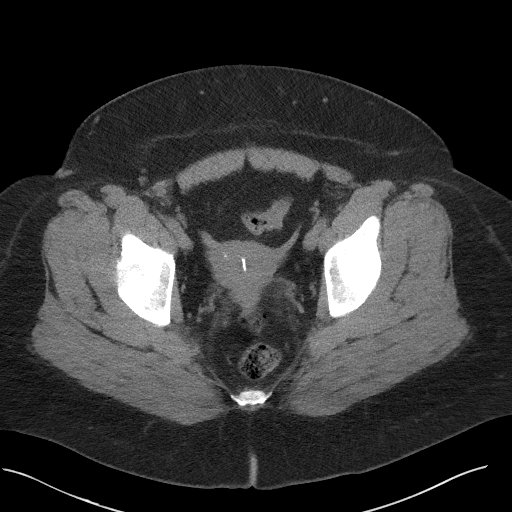
[im 26/98  soft-tissue]
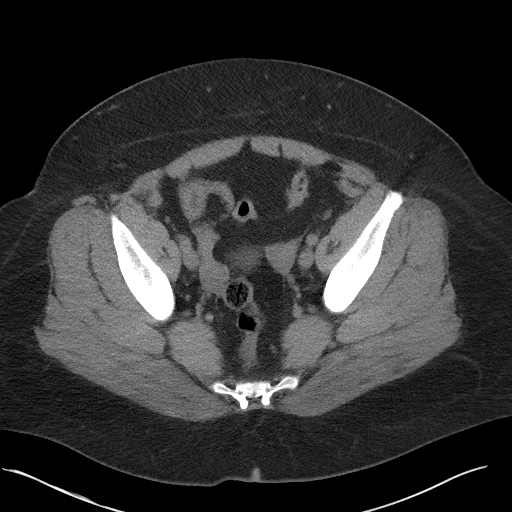
[im 34/98  soft-tissue]
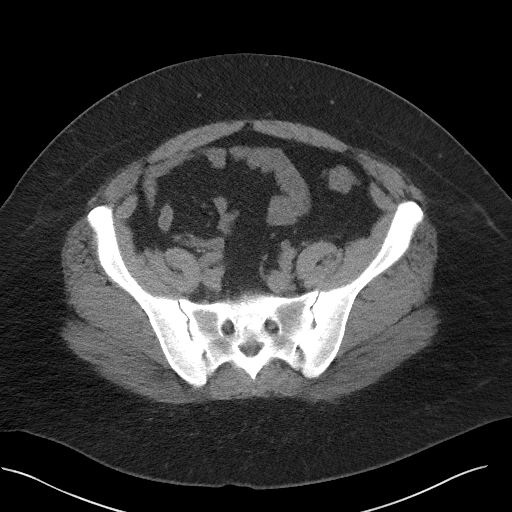
[im 43/98  soft-tissue]
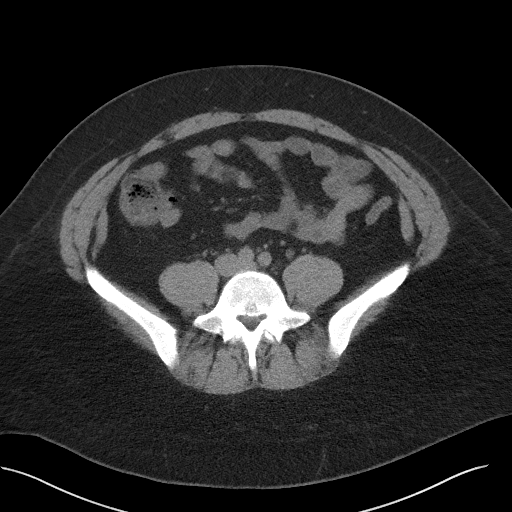
[im 51/98  soft-tissue]
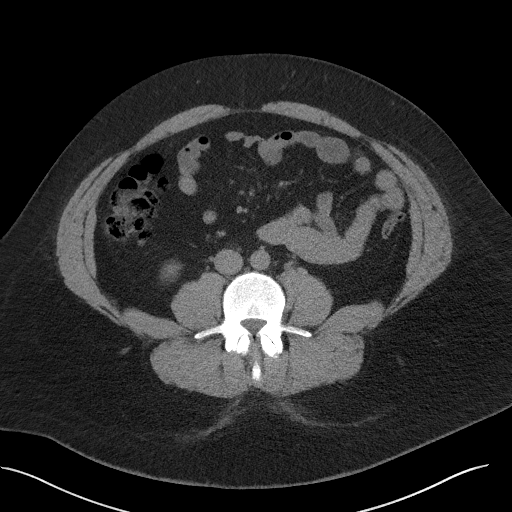
[im 55/98  soft-tissue]
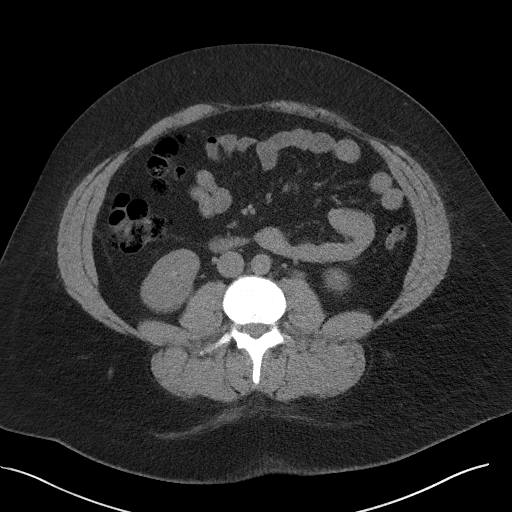
[im 64/98  soft-tissue]
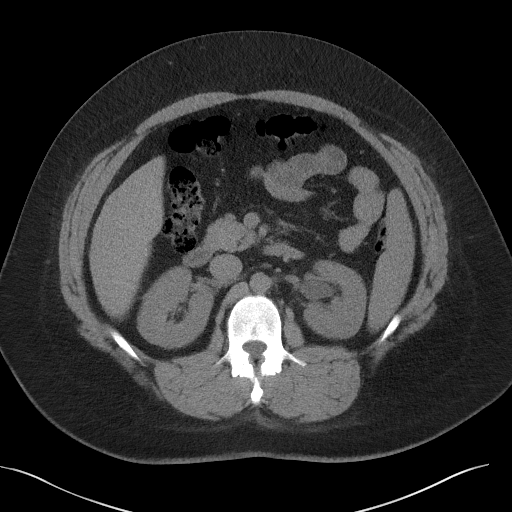
[im 64/98  bone]
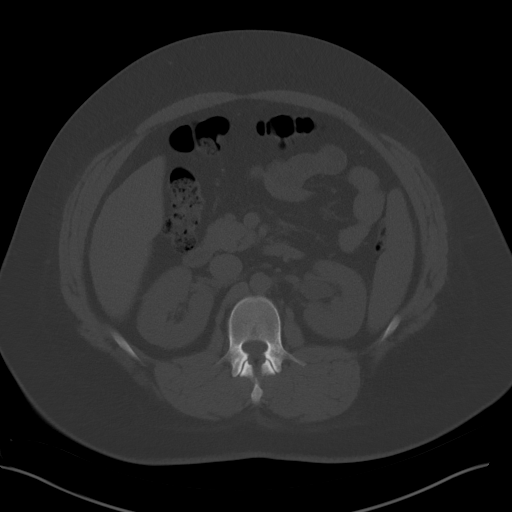
[im 72/98  soft-tissue]
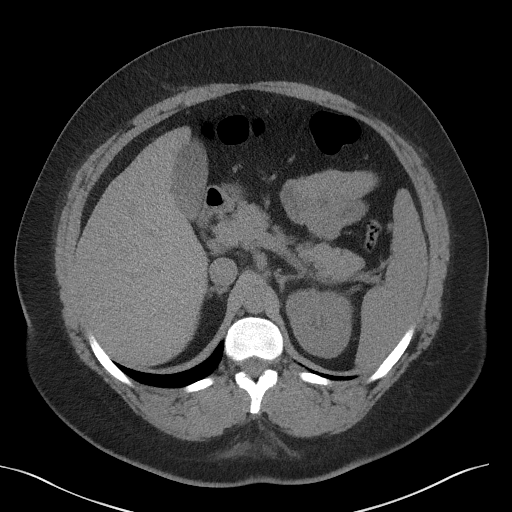
[im 76/98  soft-tissue]
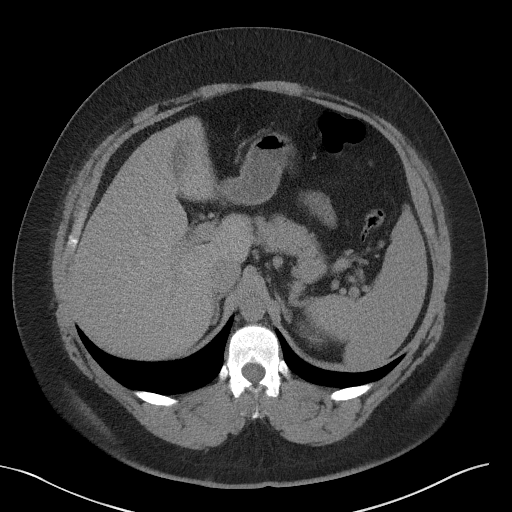
[im 85/98  soft-tissue]
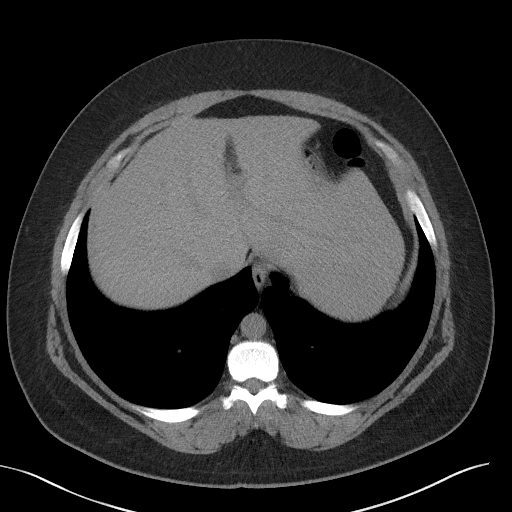
[im 93/98  soft-tissue]
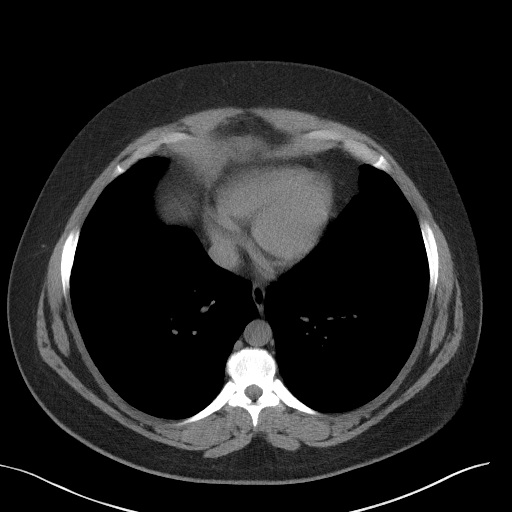

[Series 5: coronal · coronal · 0.81mm/px · 3 of 172 slices shown]
[im 58/172  soft-tissue]
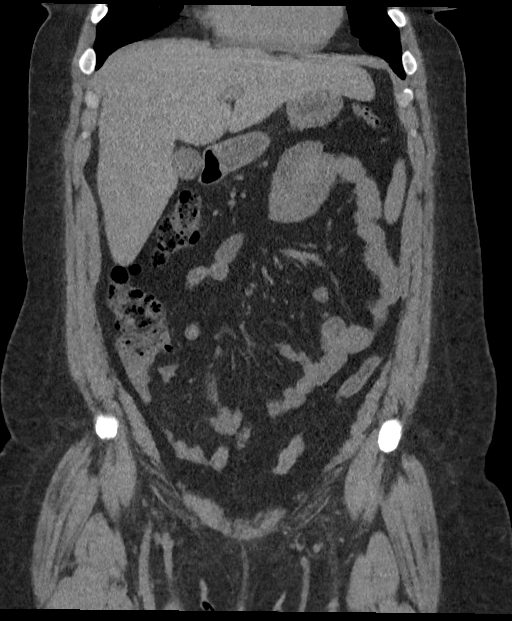
[im 77/172  soft-tissue]
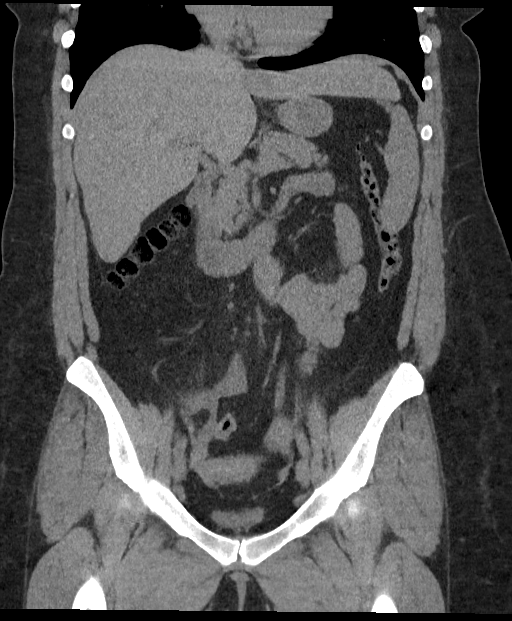
[im 96/172  soft-tissue]
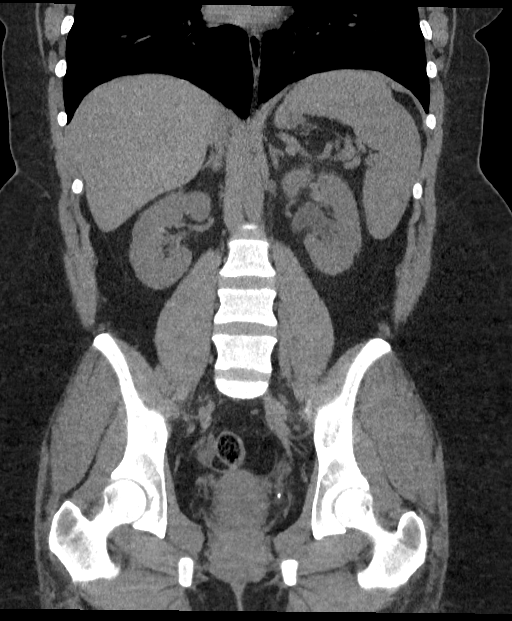

[16 of 46 positions shown; findings below may reference images not displayed]

FINDINGS: Lower chest: No acute abnormality.

Hepatobiliary: No focal liver abnormality is seen. No gallstones,
gallbladder wall thickening, or biliary dilatation.

Pancreas: Unremarkable.

Spleen: Stable in appearance.

Adrenals/Urinary Tract: There is a punctate 2 mm calculus within the
distal left ureter (series 2, image 80) with mild proximal
hydroureter. There is mild left renal pelviectasis. Kidneys are
otherwise unremarkable. The bladder is decompressed.

Stomach/Bowel: Stomach is within normal limits. Appendix appears
normal. No evidence of bowel wall thickening, distention, or
inflammatory changes.

Vascular/Lymphatic: No significant vascular findings are present. No
enlarged abdominal or pelvic lymph nodes.

Reproductive: Intrauterine device is present. Uterus and ovaries are
otherwise unremarkable.

Other: No abdominal wall hernia or abnormality. No abdominopelvic
ascites.

Musculoskeletal: No acute or significant osseous findings.
IMPRESSION: 2 mm obstructing calculus within the distal left ureter. There is
mild hydroureter and left renal pelviectasis.

## 2019-09-18 MED ORDER — SODIUM CHLORIDE 0.9 % IV SOLN
1.0000 g | Freq: Once | INTRAVENOUS | Status: AC
  Administered 2019-09-18: 1 g via INTRAVENOUS
  Filled 2019-09-18: qty 10

## 2019-09-18 MED ORDER — CEPHALEXIN 500 MG PO CAPS: 500.0000 mg | ORAL_CAPSULE | Freq: Three times a day (TID) | ORAL | 0 refills | Status: AC

## 2019-09-18 MED ORDER — ONDANSETRON 4 MG PO TBDP: 4.0000 mg | ORAL_TABLET | Freq: Three times a day (TID) | ORAL | 0 refills | Status: DC | PRN

## 2019-09-18 MED ORDER — TRAMADOL HCL 50 MG PO TABS: 50.0000 mg | ORAL_TABLET | Freq: Four times a day (QID) | ORAL | 0 refills | Status: DC | PRN

## 2019-09-18 MED ORDER — CEPHALEXIN 500 MG PO CAPS: 500.0000 mg | ORAL_CAPSULE | Freq: Two times a day (BID) | ORAL | 0 refills | Status: DC

## 2019-09-18 MED ORDER — KETOROLAC TROMETHAMINE 30 MG/ML IJ SOLN
30.0000 mg | Freq: Once | INTRAMUSCULAR | Status: AC
  Administered 2019-09-18: 30 mg via INTRAMUSCULAR
  Filled 2019-09-18: qty 1

## 2019-09-18 NOTE — Progress Notes (Signed)
Based on what you shared with me, I feel your condition warrants further evaluation and I recommend that you be seen for a face to face office visit.   Given your symptoms of UTI with back pain you need to be seen face to face to rule out a more serious infection.    NOTE: If you entered your credit card information for this eVisit, you will not be charged. You may see a "hold" on your card for the $35 but that hold will drop off and you will not have a charge processed.   If you are having a true medical emergency please call 911.      For an urgent face to face visit, [facility] has five urgent care centers for your convenience:      NEW:  [facility] [place] at [place] Get Driving Directions [id number] [place], [place] [address] . 10 am - 6pm Monday - Friday    [facility] [place]) Get Driving Directions [id number] [address] [place], [place] [address] . 10 am to 8 pm Monday-Friday . 12 pm to 8 pm [facility] at [facility] Get Driving Directions [id number] [place], [place], [place] [address] . 8 am to 8 pm Monday-Friday . 9 am to 6 pm Saturday . 11 am to 6 pm Sunday     [facility] at [facility] Get Driving Directions  [id number] [address] [place], [place] [address] . 8 am to 8 pm Monday-Friday . 8 am to 4 pm [facility] at [place] Get Driving Directions [id number] [address] [place], [place] [address] . 12 pm to 6 pm Monday-Friday      Your e-visit answers were reviewed by a board certified advanced clinical practitioner to complete your personal care plan.  Thank you for using e-Visits.

## 2019-09-18 NOTE — ED Triage Notes (Signed)
Pt reports recent dx UTI and treated with Bactrim which she finished 1 week ago.  Pt reports this morning dysuria and flank pain started. Pt states pain is worse on the L side. Pt also reports hx of kidney stones.  Pt also reports nausea with the pain.

## 2019-09-18 NOTE — ED Notes (Signed)
Pt reports left flank pain since this morning; she has had recent uti. Also has hx of kidney stones, does not think this feels the same.   Pt reports course of bactrim and states she took all of the medication as directed.She states she felt much better, but didn't have complete freedom from symptoms, and then today began to have the flank pain.

## 2019-09-18 NOTE — ED Provider Notes (Signed)
[facility] Emergency Department Provider Note   ____________________________________________    I have reviewed the triage vital signs and the nursing notes.   HISTORY  Chief Complaint Flank Pain     HPI Yesenia Beasley is a 34 y.o. female who presents with urinary frequency and left flank pain.  Patient reports she was recently treated with 5 days of Bactrim for urinary tract infection.  She had been improving but this morning she woke up with urinary frequency.  In addition she is also developed left flank pain that is mild.  She does have a history of kidney stones however this pain is not quite as severe as prior kidney stones.  No fevers or chills.  No nausea or vomiting.  Has not take anything today for this.  Past Medical History:  Diagnosis Date  . Anxiety   . Genetic testing 11/09/2017   Multi-Cancer panel (83 genes) @ [facility] - No pathogenic mutations detected  . Headache disorder   . History of ovarian cyst   . Pseudotumor cerebri     Patient Active Problem List   Diagnosis Date Noted  . Elevated LDL cholesterol level 07/23/2019  . Hemorrhagic ovarian cyst 04/27/2019  . Morbid obesity with BMI of 40.0-44.9, adult ([place]) 09/25/2018  . Gastroesophageal reflux disease 01/06/2018  . Genetic testing 11/09/2017  . Dysmenorrhea 06/25/2017  . Panic disorder 06/25/2017  . Pseudotumor cerebri 06/25/2017  . History of colon polyps 06/25/2017  . Depression, recurrent ([place]) 06/25/2017  . Anxiety 06/15/2017    Past Surgical History:  Procedure Laterality Date  . COLONOSCOPY WITH ESOPHAGOGASTRODUODENOSCOPY (EGD)    . COLONOSCOPY WITH PROPOFOL N/A 10/05/2017   Procedure: COLONOSCOPY WITH PROPOFOL;  Surgeon: Jonathon Bellows, MD;  Location: [facility] ENDOSCOPY;  Service: Gastroenterology;  Laterality: N/A;  . KNEE SURGERY Bilateral 2002,2003,2014    Prior to Admission medications   Medication Sig Start Date End Date Taking? Authorizing Provider  albuterol  (VENTOLIN HFA) 108 (90 Base) MCG/ACT inhaler INHALE 2 PUFFS INTO THE LUNGS EVERY 4 HOURS AS NEEDED FOR WHEEZING OR SHORTNESS OF BREATH OR COUGH 09/05/19   Elby Beck, FNP  albuterol (VENTOLIN HFA) 108 (90 Base) MCG/ACT inhaler Inhale 2 puffs into the lungs every 4 (four) hours as needed for wheezing or shortness of breath (cough, shortness of breath or wheezing.). 09/05/19   Elby Beck, FNP  ALPRAZolam Duanne Moron) 0.5 MG tablet TAKE 1-2 TABLETS BY MOUTH TWICE DAILY AS NEEDED FOR ANXIETY 08/01/19   Elby Beck, FNP  atorvastatin (LIPITOR) 20 MG tablet Take 1 tablet (20 mg total) by mouth daily. 07/22/19   Elby Beck, FNP  cephALEXin (KEFLEX) 500 MG capsule Take 1 capsule (500 mg total) by mouth 3 (three) times daily for 7 days. 09/18/19 09/25/19  Lavonia Drafts, MD  cyclobenzaprine (FLEXERIL) 5 MG tablet Take 5 mg by mouth 3 (three) times daily as needed for muscle spasms.    [provider]  ondansetron (ZOFRAN ODT) 4 MG disintegrating tablet Take 1 tablet (4 mg total) by mouth every 8 (eight) hours as needed. 09/18/19   Lavonia Drafts, MD  sertraline (ZOLOFT) 100 MG tablet Take 2 tablets (200 mg total) by mouth daily. 04/27/19   Elby Beck, FNP  traMADol (ULTRAM) 50 MG tablet Take 1 tablet (50 mg total) by mouth every 6 (six) hours as needed. 09/18/19 09/17/20  Lavonia Drafts, MD     Allergies Patient has no known allergies.  Family History  Problem Relation Age  of Onset  . Leukemia Mother        CMML; dx 50s; deceased 80  . Heart disease Father   . Colon polyps Father        initially in 86s; #/type unknown  . Wilm's tumor Sister        deceased at 2  . Brain cancer Maternal Grandfather        astrocytoma; deceased 30  . Heart attack Paternal Grandfather        deceased at 43  . Thyroid cancer Maternal Uncle        dx 72s  . Non-Hodgkin's lymphoma Maternal Aunt     Social History Social History   Tobacco Use  . Smoking status: Current Every  Day Smoker    Packs/day: 0.50    Types: Cigarettes  . Smokeless tobacco: Never Used  Substance Use Topics  . Alcohol use: Not Currently  . Drug use: No    Review of Systems  Constitutional: No fevers Eyes: No visual changes.  ENT: No sore throat. Cardiovascular: Denies chest pain. Respiratory: Denies shortness of breath. Gastrointestinal: As above Genitourinary: As above Musculoskeletal: Negative for back pain. Skin: Negative for rash. Neurological: Negative for headaches or weakness   ____________________________________________   PHYSICAL EXAM:  VITAL SIGNS: ED Triage Vitals  Enc Vitals Group     BP 09/18/19 1543 137/83     Pulse Rate 09/18/19 1543 82     Resp 09/18/19 1543 20     Temp 09/18/19 1543 98.1 F (36.7 C)     Temp Source 09/18/19 1543 Oral     SpO2 09/18/19 1543 96 %     Weight 09/18/19 1540 127 kg (280 lb)     Height 09/18/19 1540 1.651 m (5\' 5" )     Head Circumference --      Peak Flow --      Pain Score 09/18/19 1540 7     Pain Loc --      Pain Edu? --      Excl. in [place]? --     Constitutional: Alert and oriented  Nose: No congestion/rhinnorhea. Mouth/Throat: Mucous membranes are moist.    Cardiovascular: Normal rate, regular rhythm.  Good peripheral circulation. Respiratory: Normal respiratory effort.  No retractions.  Gastrointestinal: Soft and nontender. No distention.  Reassuring exam, no CVA tenderness  Musculoskeletal:  Warm and well perfused Neurologic:  Normal speech and language. No gross focal neurologic deficits are appreciated.  Skin:  Skin is warm, dry and intact. No rash noted. Psychiatric: Mood and affect are normal. Speech and behavior are normal.  ____________________________________________   LABS (all labs ordered are listed, but only abnormal results are displayed)  Labs Reviewed  URINALYSIS, COMPLETE (UACMP) WITH MICROSCOPIC - Abnormal; Notable for the following components:      Result Value   Color, Urine AMBER  (*)    APPearance HAZY (*)    Hgb urine dipstick LARGE (*)    Protein, ur 30 (*)    Nitrite POSITIVE (*)    RBC / HPF >50 (*)    Bacteria, UA FEW (*)    All other components within normal limits  CBC - Abnormal; Notable for the following components:   WBC 13.3 (*)    All other components within normal limits  BASIC METABOLIC PANEL - Abnormal; Notable for the following components:   Calcium 8.8 (*)    All other components within normal limits  URINE CULTURE   ____________________________________________  EKG  None ____________________________________________  RADIOLOGY  CT renal stone study ____________________________________________   PROCEDURES  Procedure(s) performed: No  Procedures   Critical Care performed: No ____________________________________________   INITIAL IMPRESSION / ASSESSMENT AND PLAN / ED COURSE  Pertinent labs & imaging results that were available during my care of the patient were reviewed by me and considered in my medical decision making (see chart for details).  Patient presents with left-sided flank pain, no CVA tenderness.  Urinalysis suspicious for urinary tract infection, positive hemoglobin, will rule out kidney stone with CT.  Lab work is overall reassuring mild elevation in white blood cell count.  CT scan demonstrates 2 mm stone, discussed with Dr. Bernardo Heater of urology given her urinalysis.  He recommends IV antibiotics here, okay for discharge with very strict return precautions, outpatient follow-up.  Discussed with patient and she is content with this plan    ____________________________________________   FINAL CLINICAL IMPRESSION(S) / ED DIAGNOSES  Final diagnoses:  Kidney stone  Lower urinary tract infectious disease        Note:  This document was prepared using Dragon voice recognition software and may include unintentional dictation errors.   Lavonia Drafts, MD 09/18/19 [phone]

## 2019-09-18 NOTE — Addendum Note (Signed)
Addended by: Evelina Dun A on: 09/18/2019 04:25 PM   Modules accepted: Orders

## 2019-09-19 LAB — URINE CULTURE

## 2019-09-20 ENCOUNTER — Other Ambulatory Visit: Payer: Self-pay

## 2019-09-20 ENCOUNTER — Telehealth: Payer: Self-pay

## 2019-09-20 DIAGNOSIS — N200 Calculus of kidney: Secondary | ICD-10-CM

## 2019-09-20 NOTE — Telephone Encounter (Signed)
Left a msg for patient on voicemail to remind her of her 10 AM appt with Dr. Diamantina Providence and to inform her to go to the radiology dept prior to her appt for KUB.

## 2019-09-21 ENCOUNTER — Ambulatory Visit
Admission: RE | Admit: 2019-09-21 | Discharge: 2019-09-21 | Disposition: A | Discharge status: Home / Self Care | Payer: 59 | Source: Ambulatory Visit | Attending: Urology | Admitting: Urology

## 2019-09-21 ENCOUNTER — Ambulatory Visit (INDEPENDENT_AMBULATORY_CARE_PROVIDER_SITE_OTHER): Payer: 59 | Admitting: Urology

## 2019-09-21 ENCOUNTER — Other Ambulatory Visit: Payer: Self-pay

## 2019-09-21 ENCOUNTER — Encounter: Payer: Self-pay | Admitting: Urology

## 2019-09-21 VITALS — BP 124/79 | HR 76 | Ht 65.5 in | Wt 287.0 lb

## 2019-09-21 DIAGNOSIS — N200 Calculus of kidney: Secondary | ICD-10-CM | POA: Insufficient documentation

## 2019-09-21 DIAGNOSIS — N201 Calculus of ureter: Secondary | ICD-10-CM | POA: Diagnosis not present

## 2019-09-21 IMAGING — CR DG ABDOMEN 1V
1 series · 3 of 3 positions shown · non-contrast
Comparison: CT scan dated [DATE]

CLINICAL DATA: Bilateral flank pain. Left ureteral stone seen on CT
scan dated [DATE].

EXAM:
ABDOMEN - 1 VIEW

[Series 1: dg abd 1 view · 0.14mm/px · 3 of 3 slices shown]
[im 1/3]
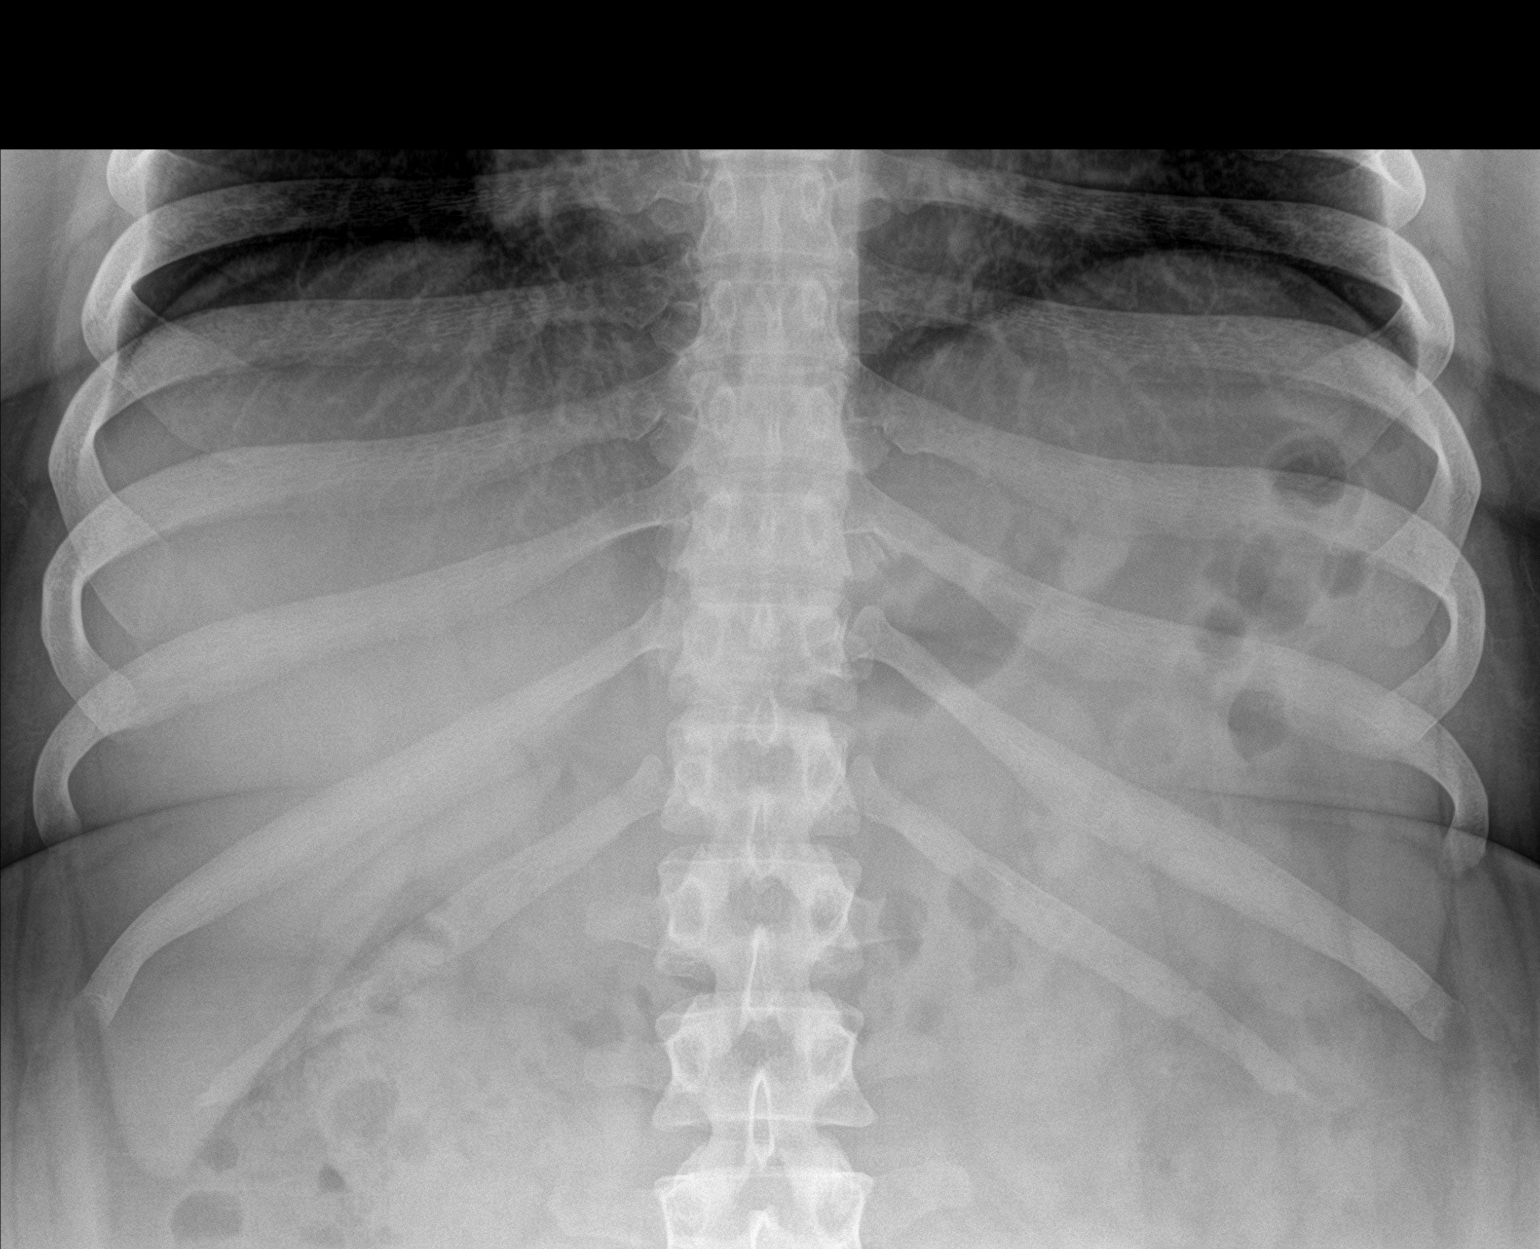
[im 2/3]
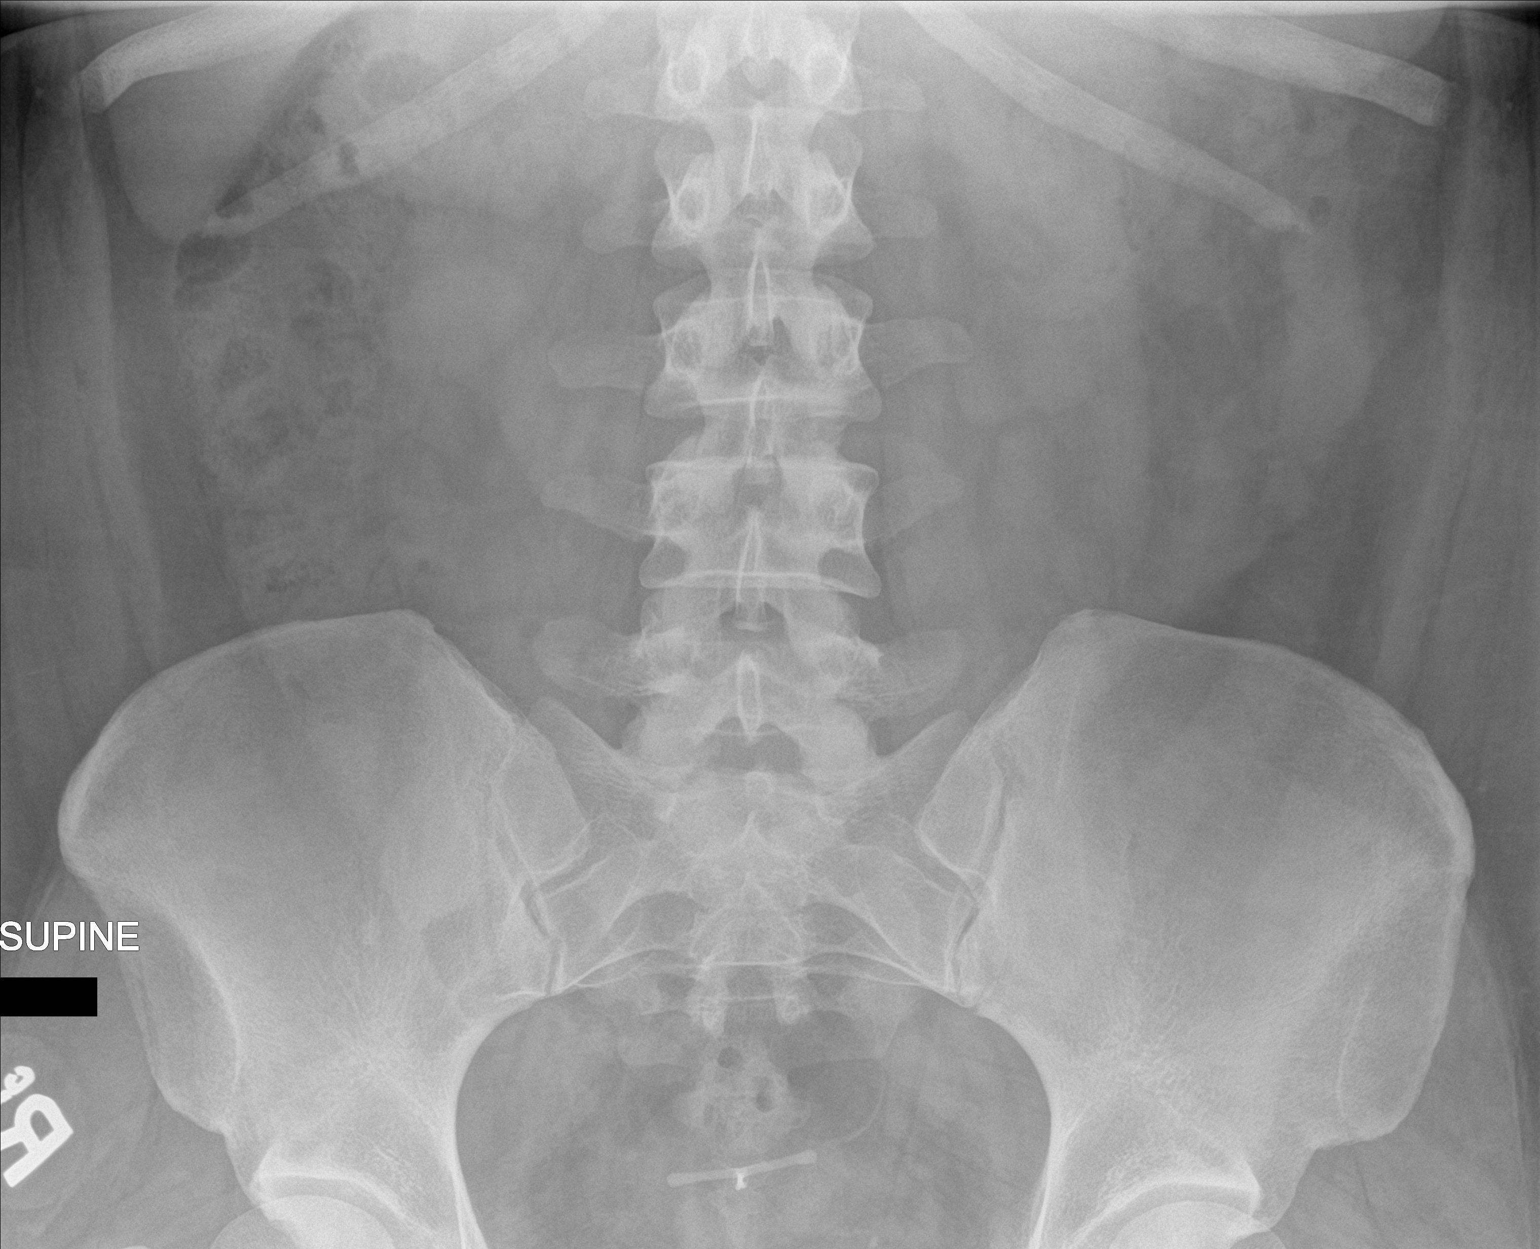
[im 3/3]
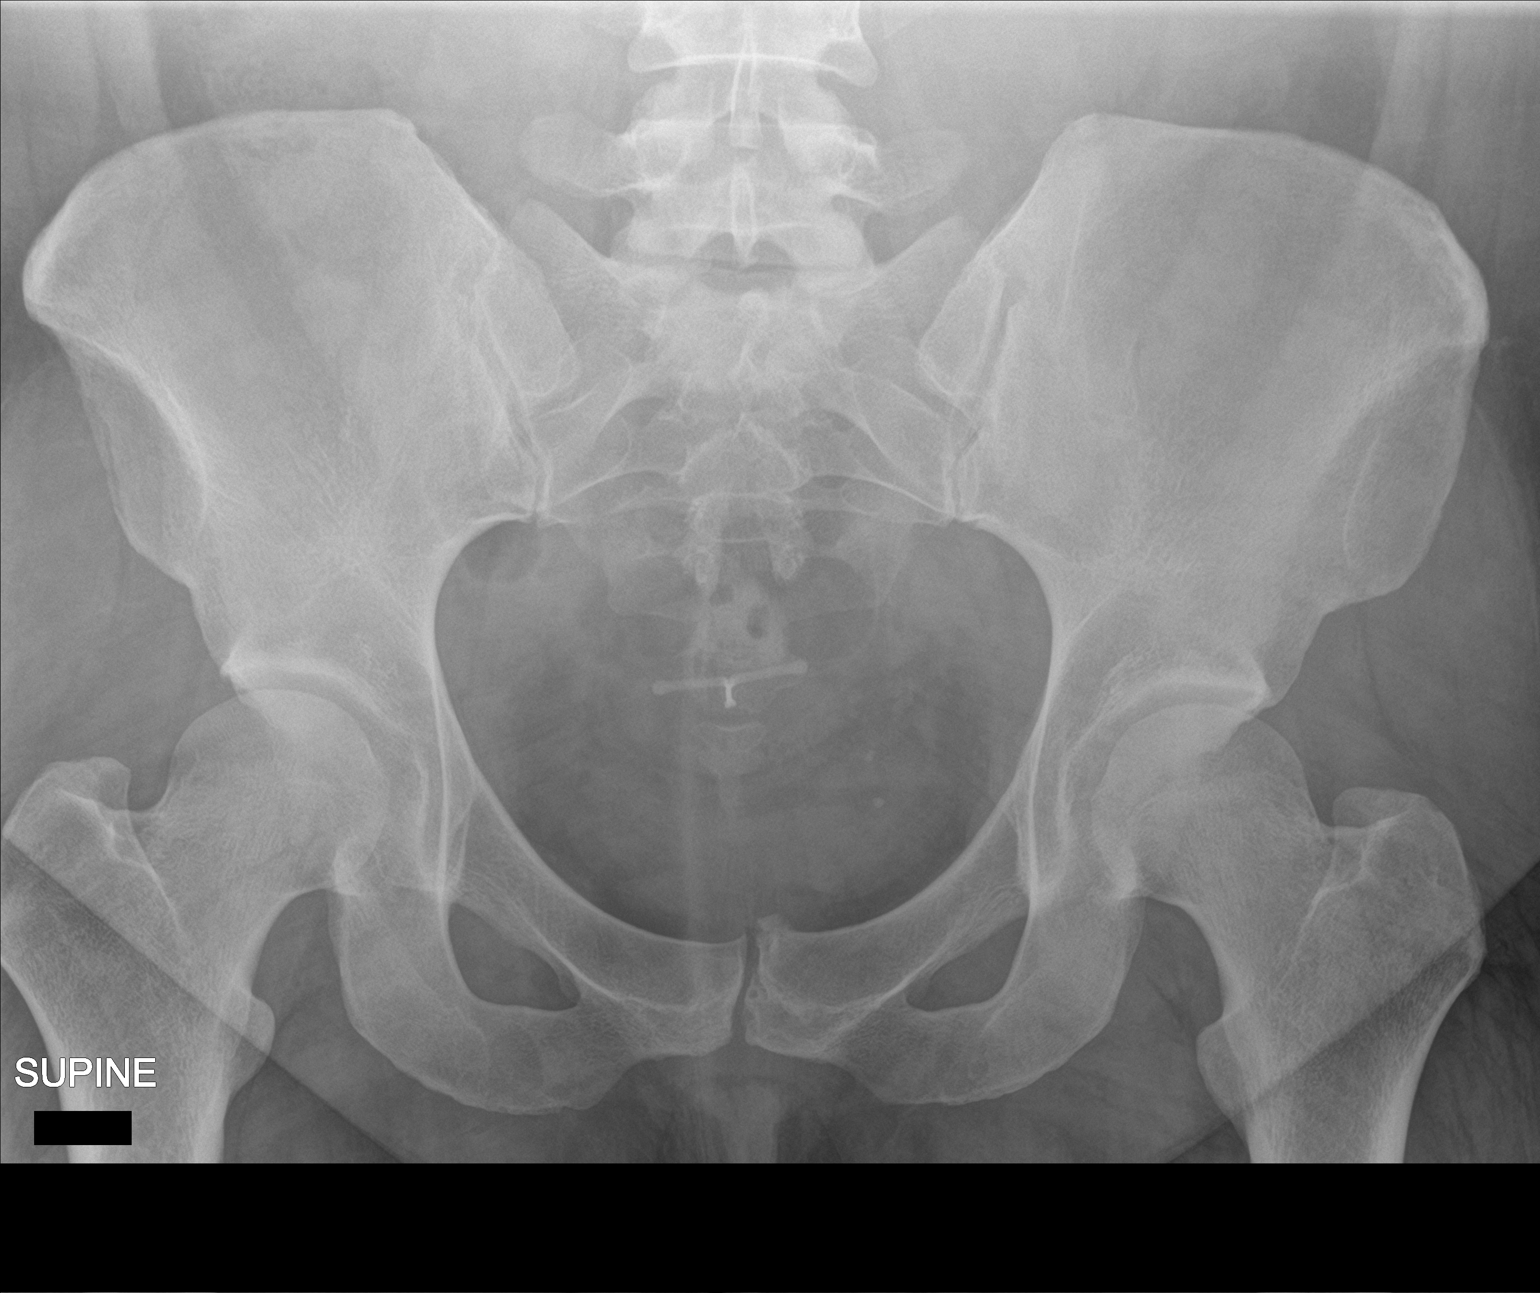

[3 of 3 positions shown; findings below may reference images not displayed]

FINDINGS: There is a persistent small stone in the distal left ureter as
demonstrated on the prior CT scan. No significant movement since the
prior study. There is a phlebolith just inferior to the left
ureteral stone.

Bowel gas pattern is normal. No renal calculi. IUD in place. Bones
are normal.
IMPRESSION: Persistent small stone in the distal left ureter. No significant
movement since the prior CT scan.

## 2019-09-21 MED ORDER — KETOROLAC TROMETHAMINE 10 MG PO TABS: 10.0000 mg | ORAL_TABLET | Freq: Four times a day (QID) | ORAL | 0 refills | Status: DC | PRN

## 2019-09-21 MED ORDER — TAMSULOSIN HCL 0.4 MG PO CAPS: 0.4000 mg | ORAL_CAPSULE | Freq: Every day | ORAL | 0 refills | Status: DC

## 2019-09-21 NOTE — Progress Notes (Signed)
09/21/19 10:28 AM   Yesenia Beasley [birth year] [id number]  Referring provider: Elby Beck, [place],  [place] [address]  CC: Left distal ureteral stone  HPI: I saw Yesenia Beasley in urology clinic today for left-sided flank pain secondary to a 3 mm left distal ureteral stone.  She is a healthy 34 year old female with a history of spontaneously passed nephrolithiasis 8 years ago who presented recently to the ED on 09/18/2019 with 1 week of left-sided flank pain, dysuria, urgency, and nausea.  CT showed a 3 mm left distal ureteral stone with upstream hydronephrosis.  There were no other renal stones.  Urinalysis was equivocal with few bacteria, greater than 50 RBCs, 6-10 squamous cells, 6-10 WBCs, and nitrite positive.  She was taking Pyridium at that time which explains the nitrite positivity.  Urine culture ultimately grew multiple species consistent with contamination.  She was discharged with pain medications and Keflex x7 days.  She reports that her pain has improved since discharge, however she still has some occasional left-sided groin pain and some urinary urgency.  She denies any fevers or chills.    Urinalysis today is benign with 0-5 WBCs, 3-10 RBCs, few bacteria, no yeast, nitrite negative, no leukocytes.  KUB today shows persistent left distal ureteral stone.   PMH: Past Medical History:  Diagnosis Date  . Anxiety   . Genetic testing 11/09/2017   Multi-Cancer panel (83 genes) @ [facility] - No pathogenic mutations detected  . Headache disorder   . History of ovarian cyst   . Kidney stone   . Pseudotumor cerebri   . Urinary tract infection     Surgical History: Past Surgical History:  Procedure Laterality Date  . COLONOSCOPY WITH ESOPHAGOGASTRODUODENOSCOPY (EGD)    . COLONOSCOPY WITH PROPOFOL N/A 10/05/2017   Procedure: COLONOSCOPY WITH PROPOFOL;  Surgeon: Jonathon Bellows, MD;  Location: [facility] ENDOSCOPY;  Service: Gastroenterology;  Laterality: N/A;  . KNEE  SURGERY Bilateral 2002,2003,2014    Allergies: No Known Allergies  Family History: Family History  Problem Relation Age of Onset  . Leukemia Mother        CMML; dx 41s; deceased 21  . Heart disease Father   . Colon polyps Father        initially in 56s; #/type unknown  . Wilm's tumor Sister        deceased at 2  . Brain cancer Maternal Grandfather        astrocytoma; deceased 20  . Heart attack Paternal Grandfather        deceased at 56  . Thyroid cancer Maternal Uncle        dx 65s  . Non-Hodgkin's lymphoma Maternal Aunt     Social History:  reports that she has been smoking cigarettes. She has been smoking about 0.50 packs per day. She has never used smokeless tobacco. She reports previous alcohol use. She reports that she does not use drugs.  ROS: Please see flowsheet from today's date for complete review of systems.  Physical Exam: BP 124/79 (BP Location: Left Arm, Patient Position: Sitting, Cuff Size: Large)   Pulse 76   Ht 5' 5.5" (1.664 m)   Wt 287 lb (130.2 kg)   LMP 09/07/2019 (Within Weeks)   BMI 47.03 kg/m    Constitutional:  Alert and oriented, No acute distress. Cardiovascular: No clubbing, cyanosis, or edema. Respiratory: Normal respiratory effort, no increased work of breathing. GI: Abdomen is soft, nontender, nondistended, no abdominal masses GU: Left CVA tenderness  Lymph: No cervical or inguinal lymphadenopathy. Skin: No rashes, bruises or suspicious lesions. Neurologic: Grossly intact, no focal deficits, moving all 4 extremities. Psychiatric: Normal mood and affect.  Laboratory Data: Reviewed, see HPI  Pertinent Imaging: Reviewed, see HPI  Assessment & Plan:   In summary, the patient is a 34 year old female with a non-infected left 3 mm distal ureteral stone.    We discussed various treatment options for urolithiasis including observation with or without medical expulsive therapy, shockwave lithotripsy (SWL), ureteroscopy and laser  lithotripsy with stent placement, and percutaneous nephrolithotomy.  We discussed that management is based on stone size, location, density, patient co-morbidities, and patient preference.   Stones <59mm in size have a >80% spontaneous passage rate. Data surrounding the use of tamsulosin for medical expulsive therapy is controversial, but meta analyses suggests it is most efficacious for distal stones between 5-56mm in size. Possible side effects include dizziness/lightheadedness, and retrograde ejaculation.  SWL has a lower stone free rate in a single procedure, but also a lower complication rate compared to ureteroscopy and avoids a stent and associated stent related symptoms. Possible complications include renal hematoma, steinstrasse, and need for additional treatment.  Ureteroscopy with laser lithotripsy and stent placement has a higher stone free rate than SWL in a single procedure, however increased complication rate including possible infection, ureteral injury, bleeding, and stent related morbidity. Common stent related symptoms include dysuria, urgency/frequency, and flank pain.  We discussed general stone prevention strategies including adequate hydration with goal of producing 2.5 L of urine daily, increasing citric acid intake, increasing calcium intake during high oxalate meals, minimizing animal protein, and decreasing salt intake. Information about dietary recommendations given today.   After an extensive discussion of the risks and benefits of the above treatment options, the patient would like to proceed with medical expulsive therapy.  Flomax nightly x2 weeks, strain urine, Toradol as needed RTC 2 weeks with repeat KUB to confirm stone passage  A total of 30 minutes were spent face-to-face with the patient, greater than 50% was spent in patient education, counseling, and coordination of care regarding small left distal ureteral stone and treatment options.   Billey Co,  [facility] [address] [place], [place] [address] [phone]

## 2019-09-21 NOTE — Patient Instructions (Signed)
Dietary Guidelines to Help Prevent Kidney Stones Kidney stones are deposits of minerals and salts that form inside your kidneys. Your risk of developing kidney stones may be greater depending on your diet, your lifestyle, the medicines you take, and whether you have certain medical conditions. Most people can reduce their chances of developing kidney stones by following the instructions below. Depending on your overall health and the type of kidney stones you tend to develop, your dietitian may give you more specific instructions. What are tips for following this plan? Reading food labels  Choose foods with "no salt added" or "low-salt" labels. Limit your sodium intake to less than 1500 mg per day.  Choose foods with calcium for each meal and snack. Try to eat about 300 mg of calcium at each meal. Foods that contain 200-500 mg of calcium per serving include: ? 8 oz (237 ml) of milk, fortified nondairy milk, and fortified fruit juice. ? 8 oz (237 ml) of kefir, yogurt, and soy yogurt. ? 4 oz (118 ml) of tofu. ? 1 oz of cheese. ? 1 cup (300 g) of dried figs. ? 1 cup (91 g) of cooked broccoli. ? 1-3 oz can of sardines or mackerel.  Most people need 1000 to 1500 mg of calcium each day. Talk to your dietitian about how much calcium is recommended for you. Shopping  Buy plenty of fresh fruits and vegetables. Most people do not need to avoid fruits and vegetables, even if they contain nutrients that may contribute to kidney stones.  When shopping for convenience foods, choose: ? Whole pieces of fruit. ? Premade salads with dressing on the side. ? Low-fat fruit and yogurt smoothies.  Avoid buying frozen meals or prepared deli foods.  Look for foods with live cultures, such as yogurt and kefir. Cooking  Do not add salt to food when cooking. Place a salt shaker on the table and allow each person to add his or her own salt to taste.  Use vegetable protein, such as beans, textured vegetable  protein (TVP), or tofu instead of meat in pasta, casseroles, and soups. Meal planning   Eat less salt, if told by your dietitian. To do this: ? Avoid eating processed or premade food. ? Avoid eating fast food.  Eat less animal protein, including cheese, meat, poultry, or fish, if told by your dietitian. To do this: ? Limit the number of times you have meat, poultry, fish, or cheese each week. Eat a diet free of meat at least 2 days a week. ? Eat only one serving each day of meat, poultry, fish, or seafood. ? When you prepare animal protein, cut pieces into small portion sizes. For most meat and fish, one serving is about the size of one deck of cards.  Eat at least 5 servings of fresh fruits and vegetables each day. To do this: ? Keep fruits and vegetables on hand for snacks. ? Eat 1 piece of fruit or a handful of berries with breakfast. ? Have a salad and fruit at lunch. ? Have two kinds of vegetables at dinner.  Limit foods that are high in a substance called oxalate. These include: ? Spinach. ? Rhubarb. ? Beets. ? Potato chips and french fries. ? Nuts.  If you regularly take a diuretic medicine, make sure to eat at least 1-2 fruits or vegetables high in potassium each day. These include: ? Avocado. ? Banana. ? Orange, prune, carrot, or tomato juice. ? Baked potato. ? Cabbage. ? Beans and split   peas. General instructions   Drink enough fluid to keep your urine clear or pale yellow. This is the most important thing you can do.  Talk to your health care provider and dietitian about taking daily supplements. Depending on your health and the cause of your kidney stones, you may be advised: ? Not to take supplements with vitamin C. ? To take a calcium supplement. ? To take a daily probiotic supplement. ? To take other supplements such as magnesium, fish oil, or vitamin B6.  Take all medicines and supplements as told by your health care provider.  Limit alcohol intake to no  more than 1 drink a day for nonpregnant women and 2 drinks a day for men. One drink equals 12 oz of beer, 5 oz of wine, or 1 oz of hard liquor.  Lose weight if told by your health care provider. Work with your dietitian to find strategies and an eating plan that works best for you. What foods are not recommended? Limit your intake of the following foods, or as told by your dietitian. Talk to your dietitian about specific foods you should avoid based on the type of kidney stones and your overall health. Grains Breads. Bagels. Rolls. Baked goods. Salted crackers. Cereal. Pasta. Vegetables Spinach. Rhubarb. Beets. Canned vegetables. Pickles. Olives. Meats and other protein foods Nuts. Nut butters. Large portions of meat, poultry, or fish. Salted or cured meats. Deli meats. Hot dogs. Sausages. Dairy Cheese. Beverages Regular soft drinks. Regular vegetable juice. Seasonings and other foods Seasoning blends with salt. Salad dressings. Canned soups. Soy sauce. Ketchup. Barbecue sauce. Canned pasta sauce. Casseroles. Pizza. Lasagna. Frozen meals. Potato chips. French fries. Summary  You can reduce your risk of kidney stones by making changes to your diet.  The most important thing you can do is drink enough fluid. You should drink enough fluid to keep your urine clear or pale yellow.  Ask your health care provider or dietitian how much protein from animal sources you should eat each day, and also how much salt and calcium you should have each day. This information is not intended to replace advice given to you by your health care provider. Make sure you discuss any questions you have with your health care provider. Document Revised: 12/15/2018 Document Reviewed: 08/05/2016 Elsevier Patient Education  2020 Elsevier Inc. Kidney Stones  Kidney stones are solid, rock-like deposits that form inside of the kidneys. The kidneys are a pair of organs that make urine. A kidney stone may form in a kidney  and move into other parts of the urinary tract, including the tubes that connect the kidneys to the bladder (ureters), the bladder, and the tube that carries urine out of the body (urethra). As the stone moves through these areas, it can cause intense pain and block the flow of urine. Kidney stones are created when high levels of certain minerals are found in the urine. The stones are usually passed out of the body through urination, but in some cases, medical treatment may be needed to remove them. What are the causes? Kidney stones may be caused by:  A condition in which certain glands produce too much parathyroid hormone (primary hyperparathyroidism), which causes too much calcium buildup in the blood.  A buildup of uric acid crystals in the bladder (hyperuricosuria). Uric acid is a chemical that the body produces when you eat certain foods. It usually exits the body in the urine.  Narrowing (stricture) of one or both of the ureters.  A   kidney blockage that is present at birth (congenital obstruction).  Past surgery on the kidney or the ureters, such as gastric bypass surgery. What increases the risk? The following factors may make you more likely to develop this condition:  Having had a kidney stone in the past.  Having a family history of kidney stones.  Not drinking enough water.  Eating a diet that is high in protein, salt (sodium), or sugar.  Being overweight or obese. What are the signs or symptoms? Symptoms of a kidney stone may include:  Pain in the side of the abdomen, right below the ribs (flank pain). Pain usually spreads (radiates) to the groin.  Needing to urinate frequently or urgently.  Painful urination.  Blood in the urine (hematuria).  Nausea.  Vomiting.  Fever and chills. How is this diagnosed? This condition may be diagnosed based on:  Your symptoms and medical history.  A physical exam.  Blood tests.  Urine tests. These may be done before and  after the stone passes out of your body through urination.  Imaging tests, such as a CT scan, abdominal X-ray, or ultrasound.  A procedure to examine the inside of the bladder (cystoscopy). How is this treated? Treatment for kidney stones depends on the size, location, and makeup of the stones. Kidney stones will often pass out of the body through urination. You may need to:  Increase your fluid intake to help pass the stone. In some cases, you may be given fluids through an IV and may need to be monitored at the hospital.  Take medicine for pain.  Make changes in your diet to help prevent kidney stones from coming back. Sometimes, medical procedures are needed to remove a kidney stone. This may involve:  A procedure to break up kidney stones using: ? A focused beam of light (laser therapy). ? Shock waves (extracorporeal shock wave lithotripsy).  Surgery to remove kidney stones. This may be needed if you have severe pain or have stones that block your urinary tract. Follow these instructions at home: Medicines  Take over-the-counter and prescription medicines only as told by your health care provider.  Ask your health care provider if the medicine prescribed to you requires you to avoid driving or using heavy machinery. Eating and drinking  Drink enough fluid to keep your urine pale yellow. You may be instructed to drink at least 8-10 glasses of water each day. This will help you pass the kidney stone.  If directed, change your diet. This may include: ? Limiting how much sodium you eat. ? Eating more fruits and vegetables. ? Limiting how much animal protein--such as red meat, poultry, fish, and eggs--you eat.  Follow instructions from your health care provider about eating or drinking restrictions. General instructions  Collect urine samples as told by your health care provider. You may need to collect a urine sample: ? 24 hours after you pass the stone. ? 8-12 weeks after  passing the kidney stone, and every 6-12 months after that.  Strain your urine every time you urinate, for as long as directed. Use the strainer that your health care provider recommends.  Do not throw out the kidney stone after passing it. Keep the stone so it can be tested by your health care provider. Testing the makeup of your kidney stone may help prevent you from getting kidney stones in the future.  Keep all follow-up visits as told by your health care provider. This is important. You may need follow-up X-rays or   ultrasounds to make sure that your stone has passed. How is this prevented? To prevent another kidney stone:  Drink enough fluid to keep your urine pale yellow. This is the best way to prevent kidney stones.  Eat a healthy diet and follow recommendations from your health care provider about foods to avoid. You may be instructed to eat a low-protein diet. Recommendations vary depending on the type of kidney stone that you have.  Maintain a healthy weight. Where to find more information  National Kidney Foundation (NKF): www.kidney.org  [employer] (UCF): www.urologyhealth.org Contact a health care provider if:  You have pain that gets worse or does not get better with medicine. Get help right away if:  You have a fever or chills.  You develop severe pain.  You develop new abdominal pain.  You faint.  You are unable to urinate. Summary  Kidney stones are solid, rock-like deposits that form inside of the kidneys.  Kidney stones can cause nausea, vomiting, blood in the urine, abdominal pain, and the urge to urinate frequently.  Treatment for kidney stones depends on the size, location, and makeup of the stones. Kidney stones will often pass out of the body through urination.  Kidney stones can be prevented by drinking enough fluids, eating a healthy diet, and maintaining a healthy weight. This information is not intended to replace advice given to  you by your health care provider. Make sure you discuss any questions you have with your health care provider. Document Revised: 01/11/2019 Document Reviewed: 01/11/2019 Elsevier Patient Education  2020 Elsevier Inc.  

## 2019-09-22 ENCOUNTER — Other Ambulatory Visit: Payer: 59

## 2019-09-22 LAB — MICROSCOPIC EXAMINATION

## 2019-09-22 LAB — URINALYSIS, COMPLETE
Bilirubin, UA: NEGATIVE
Glucose, UA: NEGATIVE
Ketones, UA: NEGATIVE
Leukocytes,UA: NEGATIVE
Nitrite, UA: NEGATIVE
Protein,UA: NEGATIVE
Specific Gravity, UA: 1.025 (ref 1.005–1.030)
Urobilinogen, Ur: 0.2 mg/dL (ref 0.2–1.0)
pH, UA: 6 (ref 5.0–7.5)

## 2019-09-23 ENCOUNTER — Ambulatory Visit: Payer: 59 | Attending: Internal Medicine

## 2019-09-23 ENCOUNTER — Other Ambulatory Visit: Payer: Self-pay

## 2019-09-23 DIAGNOSIS — Z20822 Contact with and (suspected) exposure to covid-19: Secondary | ICD-10-CM

## 2019-09-24 LAB — NOVEL CORONAVIRUS, NAA: SARS-CoV-2, NAA: NOT DETECTED

## 2019-09-26 ENCOUNTER — Other Ambulatory Visit: Payer: Self-pay | Admitting: Radiology

## 2019-09-26 DIAGNOSIS — N200 Calculus of kidney: Secondary | ICD-10-CM

## 2019-10-06 ENCOUNTER — Encounter: Payer: Self-pay | Admitting: Urology

## 2019-10-06 ENCOUNTER — Ambulatory Visit
Admission: RE | Admit: 2019-10-06 | Discharge: 2019-10-06 | Disposition: A | Discharge status: Home / Self Care | Payer: 59 | Attending: Urology | Admitting: Urology

## 2019-10-06 ENCOUNTER — Other Ambulatory Visit: Payer: Self-pay

## 2019-10-06 ENCOUNTER — Ambulatory Visit
Admission: RE | Admit: 2019-10-06 | Discharge: 2019-10-06 | Disposition: A | Discharge status: Home / Self Care | Payer: 59 | Source: Ambulatory Visit | Attending: Urology | Admitting: Urology

## 2019-10-06 ENCOUNTER — Ambulatory Visit (INDEPENDENT_AMBULATORY_CARE_PROVIDER_SITE_OTHER): Payer: 59 | Admitting: Urology

## 2019-10-06 VITALS — BP 138/81 | HR 77 | Ht 65.0 in | Wt 280.0 lb

## 2019-10-06 DIAGNOSIS — N200 Calculus of kidney: Secondary | ICD-10-CM

## 2019-10-06 IMAGING — CR DG ABDOMEN 1V
1 series · 2 of 2 positions shown · non-contrast
Comparison: [DATE]

CLINICAL DATA: Left-sided flank pain, history of distal left
ureteral stone

EXAM:
ABDOMEN - 1 VIEW

[Series 1: dg abd 1 view · 0.14mm/px · 2 of 2 slices shown]
[im 1/2]
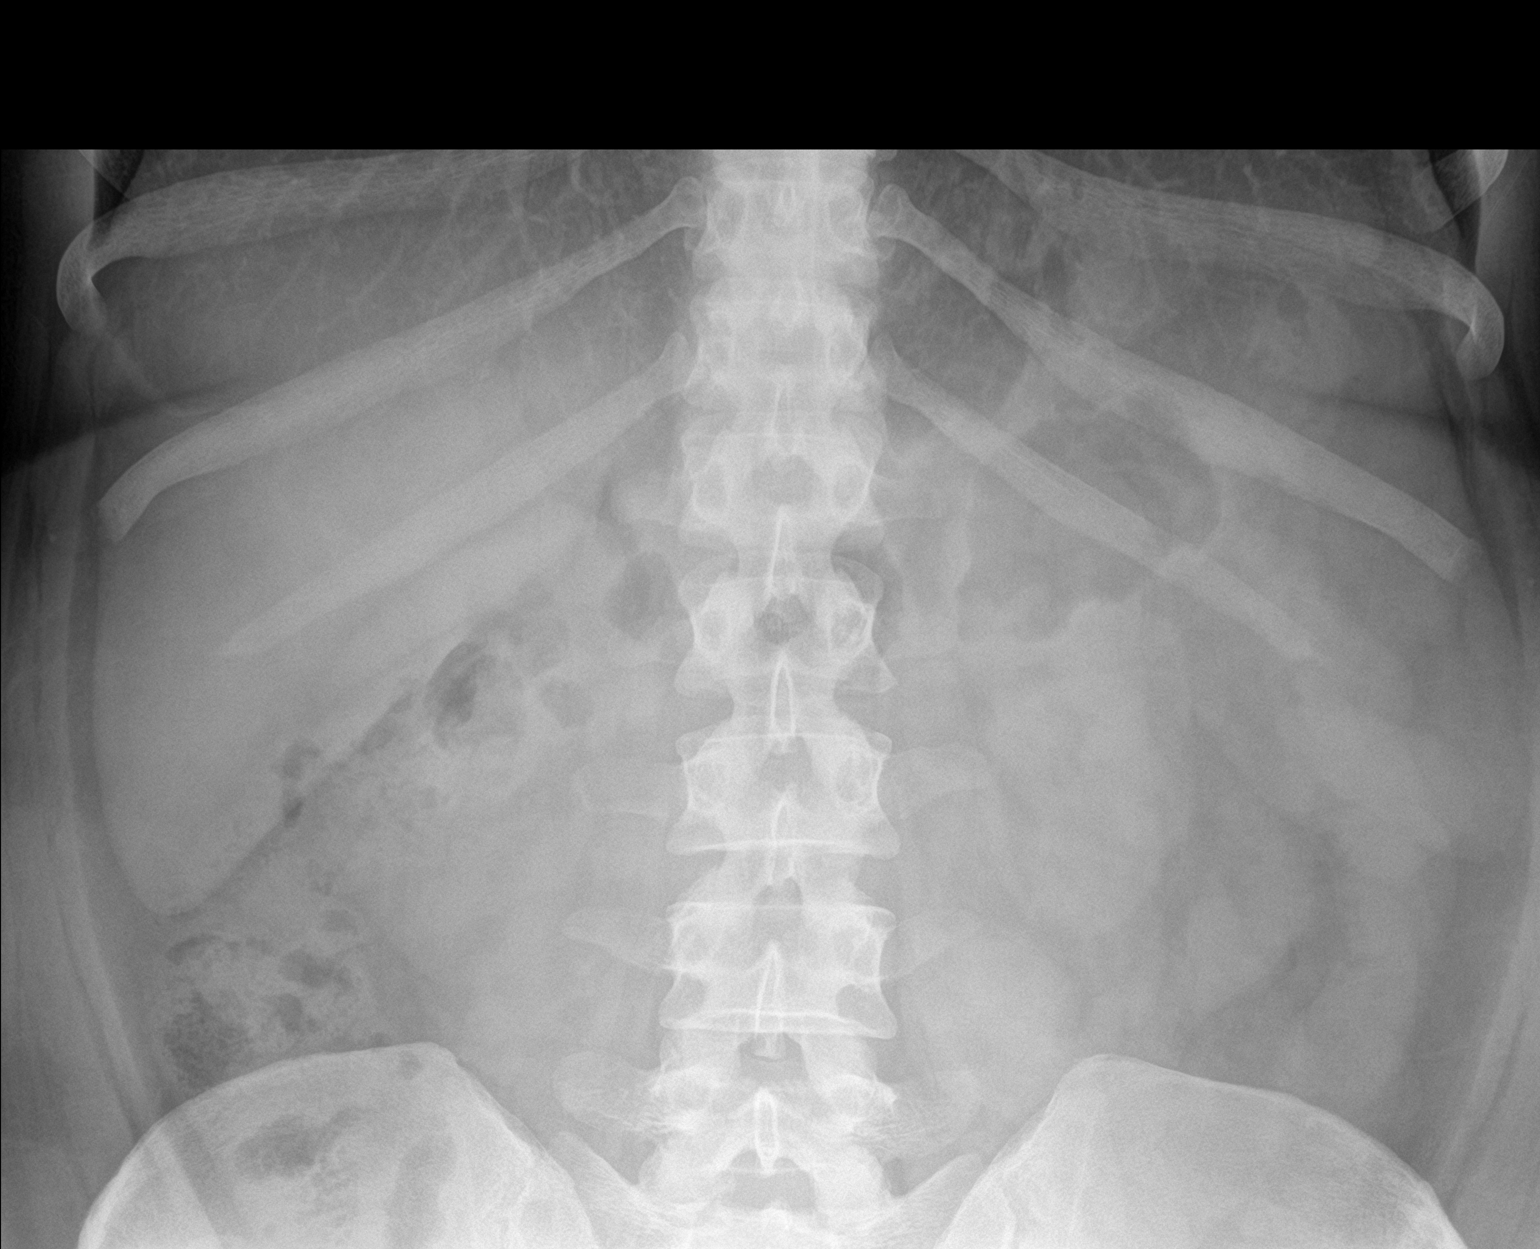
[im 2/2]
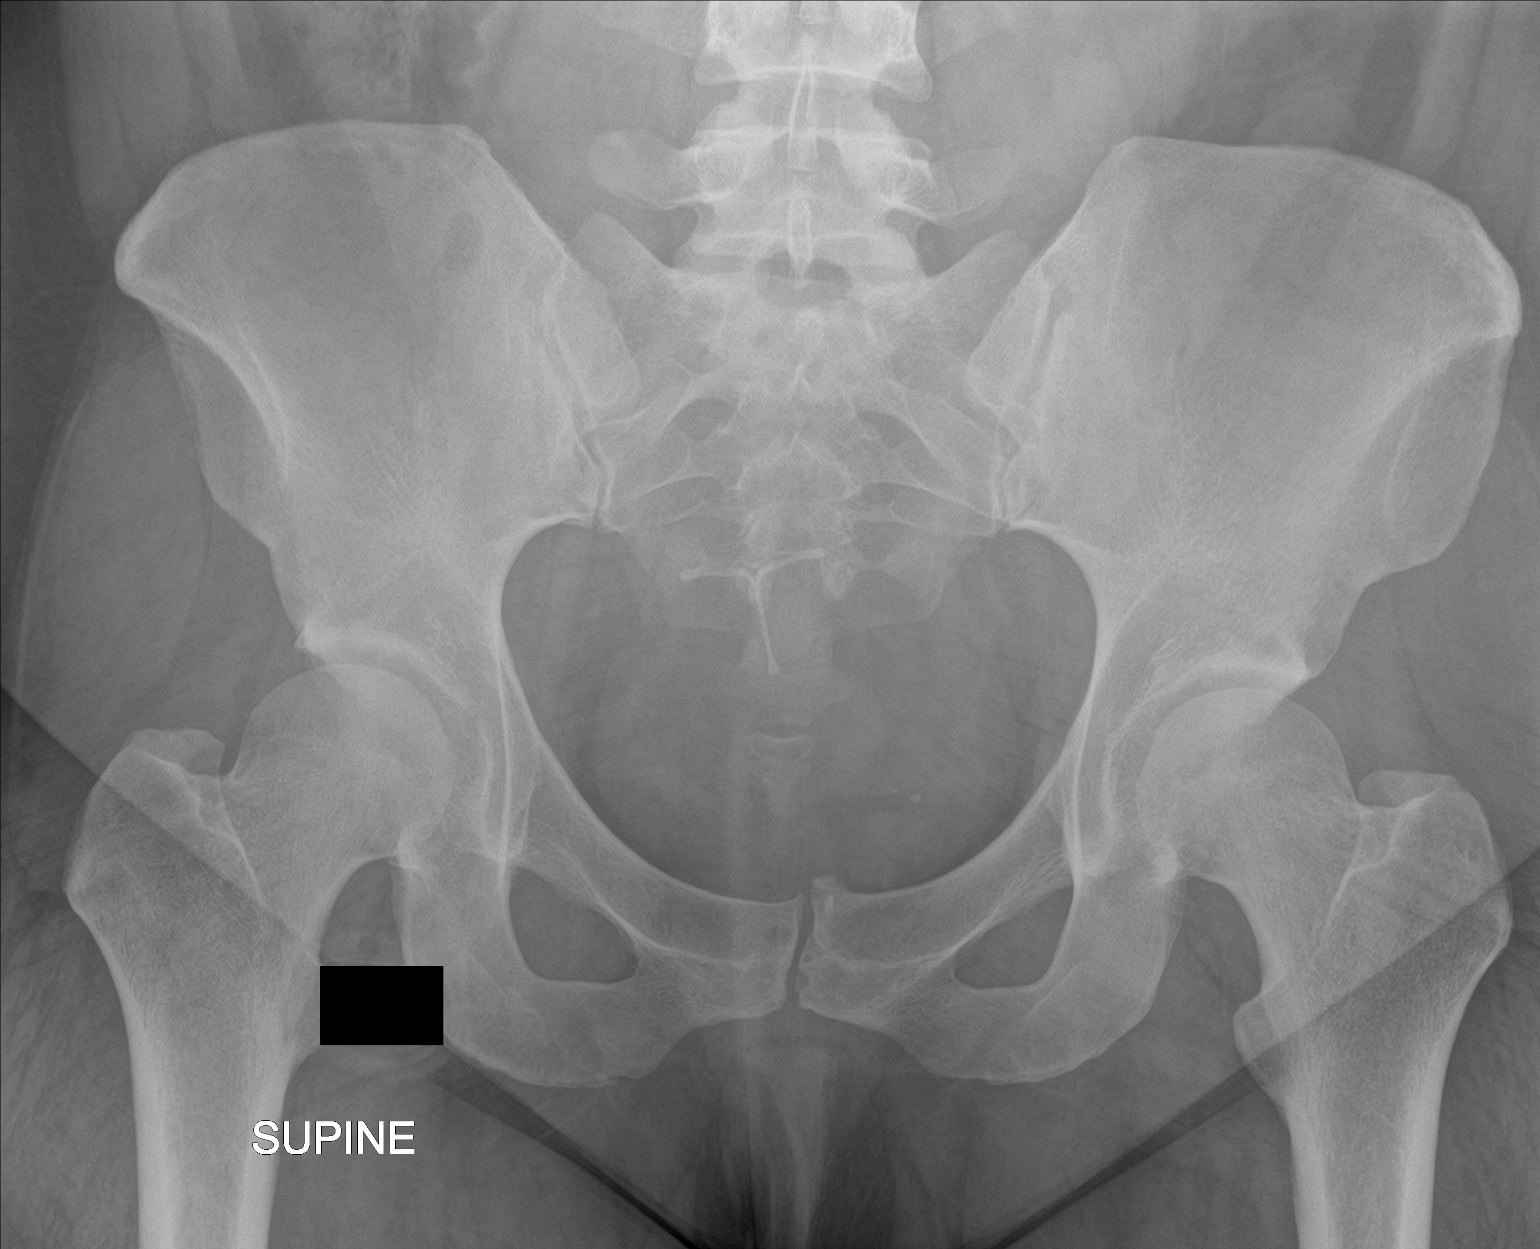

[2 of 2 positions shown; findings below may reference images not displayed]

FINDINGS: Scattered large and small bowel gas is noted. No renal calculi are
noted. There is a calcification in the left hemipelvis consistent
with a small phlebolith stable from the prior CT examination.
Previously seen distal left ureteral stone is no longer identified.
IUD is noted in place. No bony abnormality is seen.
IMPRESSION: Previously noted distal left ureteral stone has passed in the
interval.

## 2019-10-06 NOTE — Progress Notes (Signed)
   10/06/2019 11:45 AM   Yesenia Beasley [birth year] [id number]  Reason for visit: Follow up 2 mm left distal ureteral stone  HPI: I saw [name] back in urology clinic for follow-up of a left 2 mm distal ureteral stone.  She has a history of 1 other spontaneously passed stone.  She denies any pain over the last week and is feeling well.  She denies any dysuria or gross hematuria.  I personally reviewed her KUB today that shows interval passage of the left distal 2 mm ureteral stone.  We discussed general stone prevention strategies including adequate hydration with goal of producing 2.5 L of urine daily, increasing citric acid intake, increasing calcium intake during high oxalate meals, minimizing animal protein, and decreasing salt intake. Information about dietary recommendations given today.   We discussed other options including a metabolic work-up with [id number] urine, however she would like to follow-up on an as-needed basis.  Follow-up as needed  A total of 20 minutes were spent face-to-face with the patient, greater than 50% was spent in patient education, counseling, and coordination of care regarding nephrolithiasis and prevention.  Billey Co, [facility] [address], [place], [place] [address] [phone]

## 2019-10-06 NOTE — Patient Instructions (Signed)
Dietary Guidelines to Help Prevent Kidney Stones Kidney stones are deposits of minerals and salts that form inside your kidneys. Your risk of developing kidney stones may be greater depending on your diet, your lifestyle, the medicines you take, and whether you have certain medical conditions. Most people can reduce their chances of developing kidney stones by following the instructions below. Depending on your overall health and the type of kidney stones you tend to develop, your dietitian may give you more specific instructions. What are tips for following this plan? Reading food labels  Choose foods with "no salt added" or "low-salt" labels. Limit your sodium intake to less than 1500 mg per day.  Choose foods with calcium for each meal and snack. Try to eat about 300 mg of calcium at each meal. Foods that contain 200-500 mg of calcium per serving include: ? 8 oz (237 ml) of milk, fortified nondairy milk, and fortified fruit juice. ? 8 oz (237 ml) of kefir, yogurt, and soy yogurt. ? 4 oz (118 ml) of tofu. ? 1 oz of cheese. ? 1 cup (300 g) of dried figs. ? 1 cup (91 g) of cooked broccoli. ? 1-3 oz can of sardines or mackerel.  Most people need 1000 to 1500 mg of calcium each day. Talk to your dietitian about how much calcium is recommended for you. Shopping  Buy plenty of fresh fruits and vegetables. Most people do not need to avoid fruits and vegetables, even if they contain nutrients that may contribute to kidney stones.  When shopping for convenience foods, choose: ? Whole pieces of fruit. ? Premade salads with dressing on the side. ? Low-fat fruit and yogurt smoothies.  Avoid buying frozen meals or prepared deli foods.  Look for foods with live cultures, such as yogurt and kefir. Cooking  Do not add salt to food when cooking. Place a salt shaker on the table and allow each person to add his or her own salt to taste.  Use vegetable protein, such as beans, textured vegetable  protein (TVP), or tofu instead of meat in pasta, casseroles, and soups. Meal planning   Eat less salt, if told by your dietitian. To do this: ? Avoid eating processed or premade food. ? Avoid eating fast food.  Eat less animal protein, including cheese, meat, poultry, or fish, if told by your dietitian. To do this: ? Limit the number of times you have meat, poultry, fish, or cheese each week. Eat a diet free of meat at least 2 days a week. ? Eat only one serving each day of meat, poultry, fish, or seafood. ? When you prepare animal protein, cut pieces into small portion sizes. For most meat and fish, one serving is about the size of one deck of cards.  Eat at least 5 servings of fresh fruits and vegetables each day. To do this: ? Keep fruits and vegetables on hand for snacks. ? Eat 1 piece of fruit or a handful of berries with breakfast. ? Have a salad and fruit at lunch. ? Have two kinds of vegetables at dinner.  Limit foods that are high in a substance called oxalate. These include: ? Spinach. ? Rhubarb. ? Beets. ? Potato chips and french fries. ? Nuts.  If you regularly take a diuretic medicine, make sure to eat at least 1-2 fruits or vegetables high in potassium each day. These include: ? Avocado. ? Banana. ? Orange, prune, carrot, or tomato juice. ? Baked potato. ? Cabbage. ? Beans and split   peas. General instructions   Drink enough fluid to keep your urine clear or pale yellow. This is the most important thing you can do.  Talk to your health care provider and dietitian about taking daily supplements. Depending on your health and the cause of your kidney stones, you may be advised: ? Not to take supplements with vitamin C. ? To take a calcium supplement. ? To take a daily probiotic supplement. ? To take other supplements such as magnesium, fish oil, or vitamin B6.  Take all medicines and supplements as told by your health care provider.  Limit alcohol intake to no  more than 1 drink a day for nonpregnant women and 2 drinks a day for men. One drink equals 12 oz of beer, 5 oz of wine, or 1 oz of hard liquor.  Lose weight if told by your health care provider. Work with your dietitian to find strategies and an eating plan that works best for you. What foods are not recommended? Limit your intake of the following foods, or as told by your dietitian. Talk to your dietitian about specific foods you should avoid based on the type of kidney stones and your overall health. Grains Breads. Bagels. Rolls. Baked goods. Salted crackers. Cereal. Pasta. Vegetables Spinach. Rhubarb. Beets. Canned vegetables. Pickles. Olives. Meats and other protein foods Nuts. Nut butters. Large portions of meat, poultry, or fish. Salted or cured meats. Deli meats. Hot dogs. Sausages. Dairy Cheese. Beverages Regular soft drinks. Regular vegetable juice. Seasonings and other foods Seasoning blends with salt. Salad dressings. Canned soups. Soy sauce. Ketchup. Barbecue sauce. Canned pasta sauce. Casseroles. Pizza. Lasagna. Frozen meals. Potato chips. French fries. Summary  You can reduce your risk of kidney stones by making changes to your diet.  The most important thing you can do is drink enough fluid. You should drink enough fluid to keep your urine clear or pale yellow.  Ask your health care provider or dietitian how much protein from animal sources you should eat each day, and also how much salt and calcium you should have each day. This information is not intended to replace advice given to you by your health care provider. Make sure you discuss any questions you have with your health care provider. Document Revised: 12/15/2018 Document Reviewed: 08/05/2016 Elsevier Patient Education  2020 Elsevier Inc.  

## 2019-10-07 ENCOUNTER — Ambulatory Visit: Payer: 59 | Attending: Internal Medicine

## 2019-10-07 DIAGNOSIS — Z20822 Contact with and (suspected) exposure to covid-19: Secondary | ICD-10-CM

## 2019-10-08 LAB — NOVEL CORONAVIRUS, NAA: SARS-CoV-2, NAA: NOT DETECTED

## 2019-10-11 ENCOUNTER — Ambulatory Visit: Payer: 59 | Attending: Internal Medicine

## 2019-10-11 DIAGNOSIS — Z20822 Contact with and (suspected) exposure to covid-19: Secondary | ICD-10-CM

## 2019-10-12 LAB — NOVEL CORONAVIRUS, NAA: SARS-CoV-2, NAA: NOT DETECTED

## 2019-10-17 ENCOUNTER — Other Ambulatory Visit: Payer: Self-pay | Admitting: Family Medicine

## 2019-10-17 DIAGNOSIS — E7800 Pure hypercholesterolemia, unspecified: Secondary | ICD-10-CM

## 2019-10-19 ENCOUNTER — Telehealth: Payer: Self-pay

## 2019-10-19 NOTE — Telephone Encounter (Signed)
LVM w COVID screen, front door and back lab info 2.10.2021 TLJ 

## 2019-10-21 ENCOUNTER — Other Ambulatory Visit: Payer: 59

## 2019-10-28 ENCOUNTER — Ambulatory Visit: Payer: 59 | Admitting: Family Medicine

## 2019-10-29 ENCOUNTER — Other Ambulatory Visit: Payer: Self-pay | Admitting: Family Medicine

## 2019-10-29 DIAGNOSIS — F410 Panic disorder [episodic paroxysmal anxiety] without agoraphobia: Secondary | ICD-10-CM

## 2019-10-29 DIAGNOSIS — J4520 Mild intermittent asthma, uncomplicated: Secondary | ICD-10-CM

## 2019-10-31 NOTE — Telephone Encounter (Signed)
Last filled 08/01/2019  #60 x 2 refills. Last OV 10/28/2019 with Tor Netters, FNP  Please advise, thanks.

## 2019-11-15 ENCOUNTER — Other Ambulatory Visit (HOSPITAL_COMMUNITY): Payer: Self-pay | Admitting: Neurology

## 2019-11-15 ENCOUNTER — Other Ambulatory Visit: Payer: Self-pay | Admitting: Neurology

## 2019-11-15 DIAGNOSIS — G932 Benign intracranial hypertension: Secondary | ICD-10-CM

## 2019-11-21 ENCOUNTER — Other Ambulatory Visit: Payer: Self-pay

## 2019-11-21 ENCOUNTER — Ambulatory Visit
Admission: EM | Admit: 2019-11-21 | Discharge: 2019-11-21 | Disposition: A | Discharge status: Home / Self Care | Payer: 59

## 2019-11-21 ENCOUNTER — Encounter: Payer: Self-pay | Admitting: Emergency Medicine

## 2019-11-21 DIAGNOSIS — H9201 Otalgia, right ear: Secondary | ICD-10-CM | POA: Diagnosis not present

## 2019-11-21 DIAGNOSIS — J4521 Mild intermittent asthma with (acute) exacerbation: Secondary | ICD-10-CM

## 2019-11-21 HISTORY — DX: Migraine, unspecified, not intractable, without status migrainosus: G43.909

## 2019-11-21 MED ORDER — PREDNISONE 10 MG PO TABS: 40.0000 mg | ORAL_TABLET | Freq: Every day | ORAL | 0 refills | Status: AC

## 2019-11-21 NOTE — ED Provider Notes (Signed)
Roderic Palau    CSN: [id number] Arrival date & time: 11/21/19  0802      History   Chief Complaint Chief Complaint  Patient presents with  . Otalgia    right  . Adenopathy  . Cough  . Wheezing    HPI Yesenia Beasley is a 34 y.o. female.   Patient presents with 3-day history of right ear pain, cough productive of clear phlegm, wheezing, low-grade fever.  T-max 99.4.  Patient has attempted treatment at home with Tylenol and albuterol.  Her medical history includes asthma.  She denies sore throat, shortness of breath, vomiting, diarrhea, rash, or other symptoms.    The history is provided by the patient.    Past Medical History:  Diagnosis Date  . Anxiety   . Genetic testing 11/09/2017   Multi-Cancer panel (83 genes) @ [facility] - No pathogenic mutations detected  . Headache disorder   . History of ovarian cyst   . Kidney stone   . Migraine   . Pseudotumor cerebri   . Urinary tract infection     Patient Active Problem List   Diagnosis Date Noted  . Elevated LDL cholesterol level 07/23/2019  . Hemorrhagic ovarian cyst 04/27/2019  . Morbid obesity with BMI of 40.0-44.9, adult ([place]) 09/25/2018  . Gastroesophageal reflux disease 01/06/2018  . Genetic testing 11/09/2017  . Dysmenorrhea 06/25/2017  . Panic disorder 06/25/2017  . Pseudotumor cerebri 06/25/2017  . History of colon polyps 06/25/2017  . Depression, recurrent ([place]) 06/25/2017  . Anxiety 06/15/2017    Past Surgical History:  Procedure Laterality Date  . COLONOSCOPY WITH ESOPHAGOGASTRODUODENOSCOPY (EGD)    . COLONOSCOPY WITH PROPOFOL N/A 10/05/2017   Procedure: COLONOSCOPY WITH PROPOFOL;  Surgeon: Jonathon Bellows, MD;  Location: [facility] ENDOSCOPY;  Service: Gastroenterology;  Laterality: N/A;  . KNEE SURGERY Bilateral 2002,2003,2014  . TONSILLECTOMY      OB History   No obstetric history on file.      Home Medications    Prior to Admission medications   Medication Sig Start Date End Date Taking?  Authorizing Provider  albuterol (VENTOLIN HFA) 108 (90 Base) MCG/ACT inhaler INHALE 2 PUFFS INTO THE LUNGS EVERY 4 HOURS AS NEEDED FOR WHEEZING OR SHORTNESS OF BREATH OR COUGH 09/05/19  Yes Elby Beck, FNP  ALPRAZolam Duanne Moron) 0.5 MG tablet TAKE 1-2 TABLETS BY MOUTH TWICE DAILY AS NEEDED FOR ANXIETY 10/31/19  Yes Elby Beck, FNP  atorvastatin (LIPITOR) 20 MG tablet Take 1 tablet (20 mg total) by mouth daily. 07/22/19  Yes Elby Beck, FNP  cholecalciferol (VITAMIN D3) 25 MCG (1000 UNIT) tablet Take 1,000 Units by mouth daily.   Yes [provider]  Erenumab-aooe (AIMOVIG) 140 MG/ML SOAJ Inject 140 mg into the skin every 30 (thirty) days. 11/07/19  Yes [provider]  levonorgestrel (LILETTA, 52 MG,) 19.5 MCG/DAY IUD IUD 1 each by Intrauterine route once.   Yes [provider]  ondansetron (ZOFRAN ODT) 4 MG disintegrating tablet Take 1 tablet (4 mg total) by mouth every 8 (eight) hours as needed. 09/18/19  Yes Lavonia Drafts, MD  Rimegepant Sulfate 75 MG TBDP Take 1 tablet by mouth every other day. 11/07/19  Yes [provider]  sertraline (ZOLOFT) 100 MG tablet Take 2 tablets (200 mg total) by mouth daily. 04/27/19  Yes Elby Beck, FNP  albuterol (VENTOLIN HFA) 108 (90 Base) MCG/ACT inhaler INHALE 2 PUFFS INTO THE LUNGS EVERY 4 HRS AS NEEDED FOR WHEEZING OR SHORTNESS OF BREATH , COUGH.  10/31/19   Elby Beck, FNP  cyclobenzaprine (FLEXERIL) 5 MG tablet Take 5 mg by mouth 3 (three) times daily as needed for muscle spasms.    [provider]  ketorolac (TORADOL) 10 MG tablet Take 1 tablet (10 mg total) by mouth every 6 (six) hours as needed for severe pain. 09/21/19   Billey Co, MD  predniSONE (DELTASONE) 10 MG tablet Take 4 tablets (40 mg total) by mouth daily for 5 days. 11/21/19 11/26/19  Sharion Balloon, NP  tamsulosin (FLOMAX) 0.4 MG CAPS capsule Take 1 capsule (0.4 mg total) by mouth daily after supper. 09/21/19   Billey Co, MD  traMADol (ULTRAM) 50 MG tablet Take 1 tablet (50 mg total) by mouth every 6 (six) hours as needed. Patient not taking: Reported on 10/06/2019 09/18/19 09/17/20  Lavonia Drafts, MD    Family History Family History  Problem Relation Age of Onset  . Leukemia Mother        CMML; dx 69s; deceased 17  . Heart disease Father   . Colon polyps Father        initially in 54s; #/type unknown  . Wilm's tumor Sister        deceased at 2  . Brain cancer Maternal Grandfather        astrocytoma; deceased 61  . Heart attack Paternal Grandfather        deceased at 33  . Thyroid cancer Maternal Uncle        dx 81s  . Non-Hodgkin's lymphoma Maternal Aunt     Social History Social History   Tobacco Use  . Smoking status: Current Every Day Smoker    Packs/day: 0.50    Years: 10.00    Pack years: 5.00    Types: Cigarettes  . Smokeless tobacco: Never Used  Substance Use Topics  . Alcohol use: Never  . Drug use: No     Allergies   Patient has no known allergies.   Review of Systems Review of Systems  Constitutional: Positive for fever. Negative for chills.  HENT: Positive for ear pain. Negative for congestion, rhinorrhea and sore throat.   Eyes: Negative for pain and visual disturbance.  Respiratory: Positive for cough and wheezing. Negative for shortness of breath.   Cardiovascular: Negative for chest pain and palpitations.  Gastrointestinal: Negative for abdominal pain, diarrhea, nausea and vomiting.  Genitourinary: Negative for dysuria and hematuria.  Musculoskeletal: Negative for arthralgias and back pain.  Skin: Negative for color change and rash.  Neurological: Negative for seizures and syncope.  All other systems reviewed and are negative.    Physical Exam Triage Vital Signs ED Triage Vitals  Enc Vitals Group     BP      Pulse      Resp      Temp      Temp src      SpO2      Weight      Height      Head Circumference      Peak Flow      Pain Score       Pain Loc      Pain Edu?      Excl. in [place]?    No data found.  Updated Vital Signs BP 129/81 (BP Location: Left Arm)   Pulse 75   Temp 98.7 F (37.1 C) (Oral)   Resp 18   Ht 5\' 5"  (1.651 m)   Wt 280 lb (127 kg)  SpO2 97%   BMI 46.59 kg/m   Visual Acuity Right Eye Distance:   Left Eye Distance:   Bilateral Distance:    Right Eye Near:   Left Eye Near:    Bilateral Near:     Physical Exam Vitals and nursing note reviewed.  Constitutional:      General: She is not in acute distress.    Appearance: She is well-developed.  HENT:     Head: Normocephalic and atraumatic.     Right Ear: Tympanic membrane, ear canal and external ear normal.     Left Ear: Tympanic membrane, ear canal and external ear normal.     Nose: Nose normal.     Mouth/Throat:     Mouth: Mucous membranes are moist.     Pharynx: Oropharynx is clear.  Eyes:     Conjunctiva/sclera: Conjunctivae normal.  Cardiovascular:     Rate and Rhythm: Normal rate and regular rhythm.     Heart sounds: No murmur.  Pulmonary:     Effort: Pulmonary effort is normal. No respiratory distress.     Breath sounds: Wheezing present.     Comments: Faint scattered expiratory wheezes on left side. Abdominal:     General: Bowel sounds are normal.     Palpations: Abdomen is soft.     Tenderness: There is no abdominal tenderness. There is no guarding or rebound.  Musculoskeletal:     Cervical back: Neck supple.  Lymphadenopathy:     Cervical: Cervical adenopathy present.  Skin:    General: Skin is warm and dry.     Findings: No rash.  Neurological:     General: No focal deficit present.     Mental Status: She is alert and oriented to person, place, and time.  Psychiatric:        Mood and Affect: Mood normal.        Behavior: Behavior normal.      UC Treatments / Results  Labs (all labs ordered are listed, but only abnormal results are displayed) Labs Reviewed - No data to display  EKG   Radiology No results  found.  Procedures Procedures (including critical care time)  Medications Ordered in UC Medications - No data to display  Initial Impression / Assessment and Plan / UC Course  I have reviewed the triage vital signs and the nursing notes.  Pertinent labs & imaging results that were available during my care of the patient were reviewed by me and considered in my medical decision making (see chart for details).   Asthma exacerbation, right otalgia.  Treating with prednisone and continued use of albuterol inhaler.  Additionally instructed patient to take Mucinex.  Discussed that she can take Tylenol or ibuprofen for discomfort.  Instructed patient to follow-up with her PCP or return here if her symptoms are not improving.  Patient agrees to plan of care.      Final Clinical Impressions(s) / UC Diagnoses   Final diagnoses:  Mild intermittent asthma with exacerbation  Right ear pain     Discharge Instructions     Take the prednisone as directed.  Use your albuterol inhaler as directed.    Additionally, take plain over-the-counter Mucinex 1200 mg every 12 hours for 3 days.   Take Tylenol or ibuprofen as needed for discomfort.    Follow-up with your primary care provider or return here if your symptoms are not improving.        ED Prescriptions    Medication Sig Dispense Auth. Provider  predniSONE (DELTASONE) 10 MG tablet Take 4 tablets (40 mg total) by mouth daily for 5 days. 20 tablet Sharion Balloon, NP     PDMP not reviewed this encounter.   Sharion Balloon, NP 11/21/19 [phone]

## 2019-11-21 NOTE — Discharge Instructions (Addendum)
Take the prednisone as directed.  Use your albuterol inhaler as directed.    Additionally, take plain over-the-counter Mucinex 1200 mg every 12 hours for 3 days.   Take Tylenol or ibuprofen as needed for discomfort.    Follow-up with your primary care provider or return here if your symptoms are not improving.

## 2019-11-21 NOTE — ED Triage Notes (Signed)
Patient in today c/o right ear pain, swollen glands, productive cough (clear) and wheezing x 2-3 days. Low grade temp 99.4 this morning. Patient has OTC Tylenol and Albuterol inhaler. Patient's last dose of Tylenol at ~11pm last night. Patient has a history of exercise induced asthma.

## 2019-11-24 ENCOUNTER — Other Ambulatory Visit: Payer: Self-pay

## 2019-11-24 ENCOUNTER — Ambulatory Visit
Admission: RE | Admit: 2019-11-24 | Discharge: 2019-11-24 | Disposition: A | Discharge status: Home / Self Care | Payer: 59 | Source: Ambulatory Visit | Attending: Neurology | Admitting: Neurology

## 2019-11-24 DIAGNOSIS — G932 Benign intracranial hypertension: Secondary | ICD-10-CM | POA: Diagnosis not present

## 2019-11-24 IMAGING — MR MR MRV HEAD WO/W CM
2 series · 48 of 48 positions shown · IV contrast (agent unspecified)
Comparison: Head CT [DATE]

CONTRAST:  10 cc Gadavist

CLINICAL DATA: Intracranial hypertension. Frequent headache. Empty
sella. Visual disturbance. Follow-up.

EXAM:
MR VENOGRAM HEAD WITHOUT AND WITH CONTRAST
TECHNIQUE: Angiographic images of the intracranial venous structures were
obtained using MRV technique without and with intravenous contrast.

[Series 5: tof_2d_paracor · coronal · 2.5mm · 0.98mm/px · 19 of 128 slices shown]
[im 1/128]
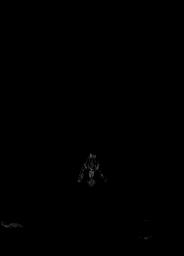
[im 8/128]
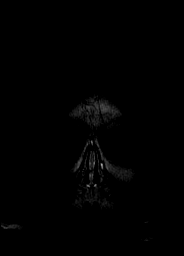
[im 15/128]
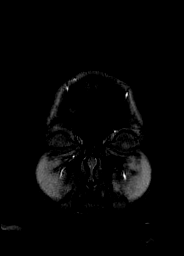
[im 22/128]
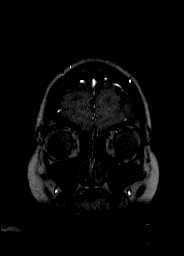
[im 29/128]
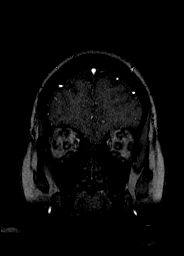
[im 36/128]
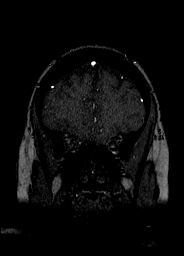
[im 43/128]
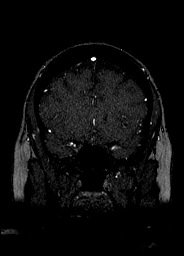
[im 50/128]
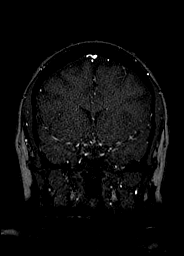
[im 57/128]
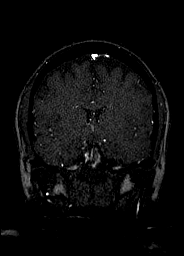
[im 64/128]
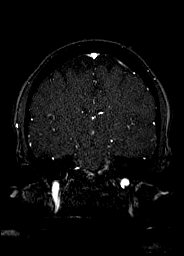
[im 71/128]
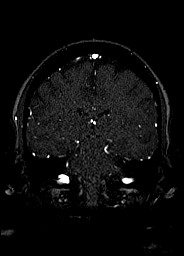
[im 78/128]
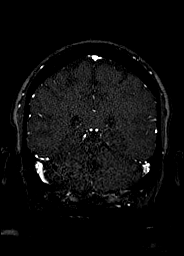
[im 85/128]
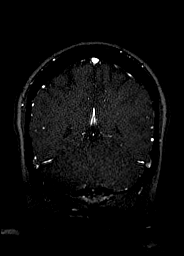
[im 92/128]
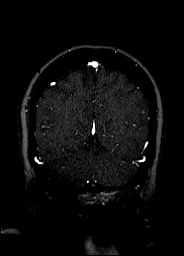
[im 99/128]
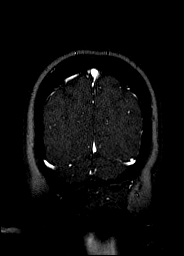
[im 106/128]
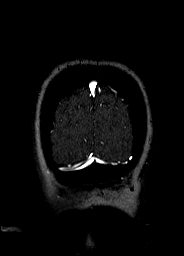
[im 113/128]
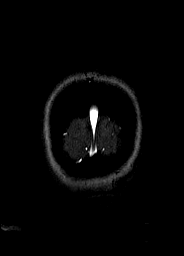
[im 120/128]
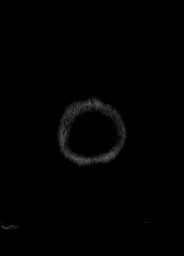
[im 128/128]
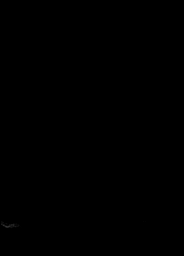

[Series 9: t1_mprage_sag_p2_iso · sagittal · 1.0mm · 0.98mm/px · 29 of 192 slices shown]
[im 1/192]
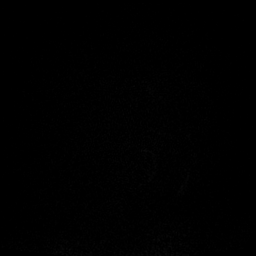
[im 7/192]
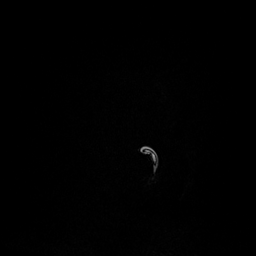
[im 14/192]
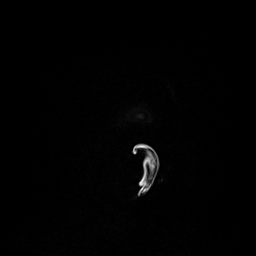
[im 21/192]
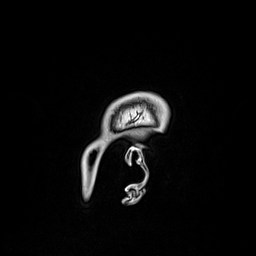
[im 28/192]
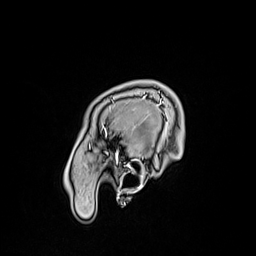
[im 35/192]
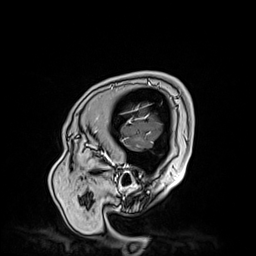
[im 41/192]
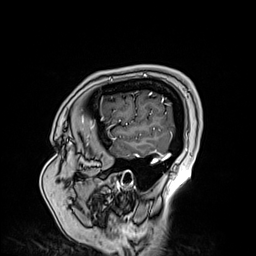
[im 48/192]
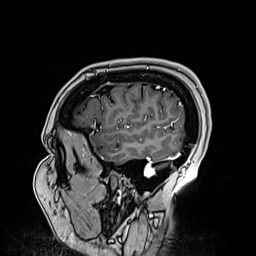
[im 55/192]
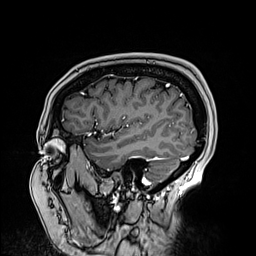
[im 62/192]
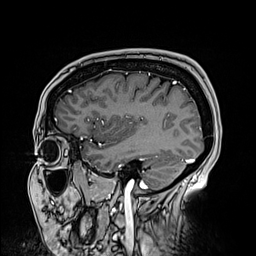
[im 69/192]
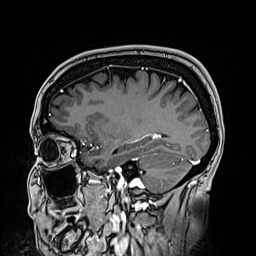
[im 76/192]
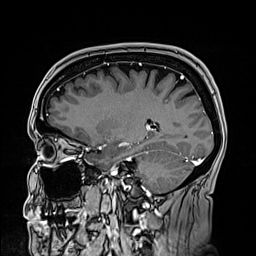
[im 82/192]
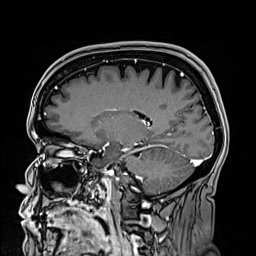
[im 89/192]
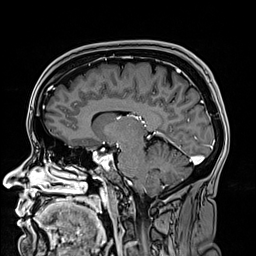
[im 96/192]
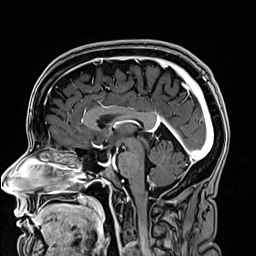
[im 103/192]
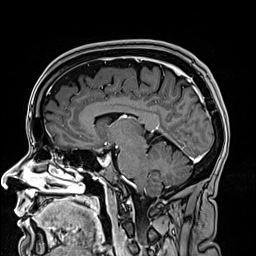
[im 110/192]
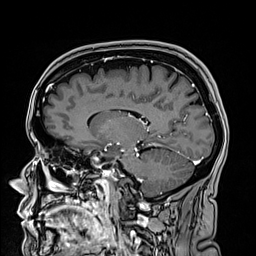
[im 116/192]
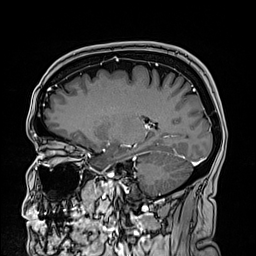
[im 123/192]
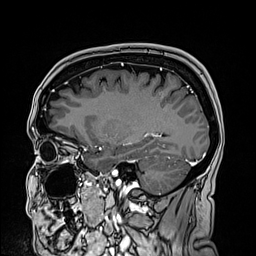
[im 130/192]
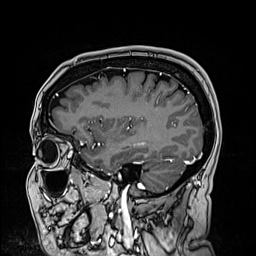
[im 137/192]
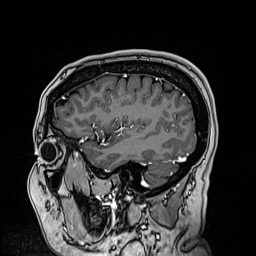
[im 144/192]
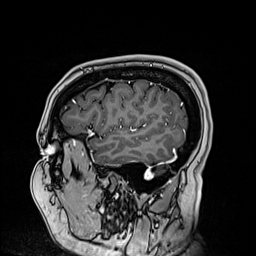
[im 151/192]
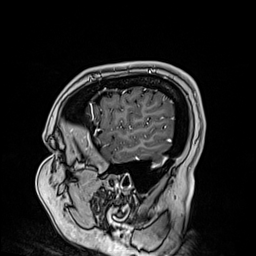
[im 157/192]
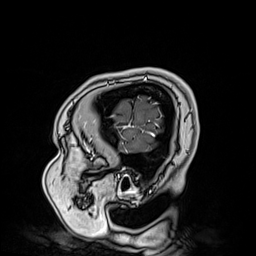
[im 164/192]
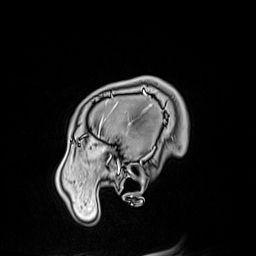
[im 171/192]
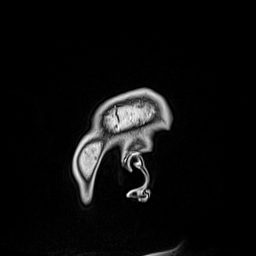
[im 178/192]
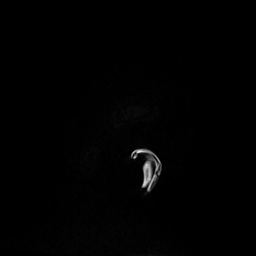
[im 185/192]
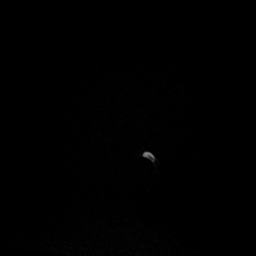
[im 192/192]
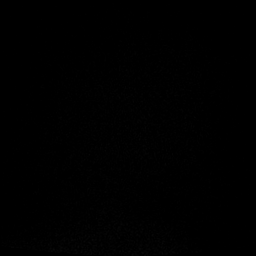

[48 of 48 positions shown; findings below may reference images not displayed]

FINDINGS: Superior sagittal sinus is widely patent and normal. Both transverse
sinuses are patent and normal, symmetric in size without evidence of
thrombosis or stenosis. Sigmoid sinuses and jugular veins are
patent. Deep venous system appears normal.

No brain parenchymal lesion is seen. Arachnoid herniation into the
sella as noted in the history.
IMPRESSION: Normal intracranial MR venography. No evidence of thrombosis or
stenosis.

## 2019-11-24 MED ORDER — GADOBUTROL 1 MMOL/ML IV SOLN
10.0000 mL | Freq: Once | INTRAVENOUS | Status: AC | PRN
  Administered 2019-11-24: 10 mL via INTRAVENOUS

## 2019-12-20 ENCOUNTER — Other Ambulatory Visit: Payer: Self-pay | Admitting: Family Medicine

## 2019-12-20 DIAGNOSIS — F339 Major depressive disorder, recurrent, unspecified: Secondary | ICD-10-CM

## 2019-12-20 DIAGNOSIS — J4520 Mild intermittent asthma, uncomplicated: Secondary | ICD-10-CM

## 2019-12-20 DIAGNOSIS — F410 Panic disorder [episodic paroxysmal anxiety] without agoraphobia: Secondary | ICD-10-CM

## 2019-12-21 NOTE — Telephone Encounter (Signed)
I left VM for patient to return phone call.   

## 2019-12-21 NOTE — Telephone Encounter (Signed)
Please call patient and ask about her albuterol use- how many times a day and how many days a week. Is she taking other medications for breathing/ allergies right now?

## 2019-12-21 NOTE — Telephone Encounter (Signed)
Albuterol was last refilled 10/31/19 for 18g with 1 refill. Sertraline was last refilled 04/27/19 for #180 with 1 refill.  Patient was last seen for a CPE on 07/22/19 and has no other upcoming appts. Are these medications ok to refill?

## 2019-12-30 NOTE — Telephone Encounter (Signed)
I left 2nd message for patient to return phone call.   

## 2020-01-04 NOTE — Telephone Encounter (Signed)
3rd attempt to contact patient, and I have been unable to reach. I left voicemail for her to return phone call.  Yesenia Beasley, patient does have a lab appt on 4/30. Please advise on refill or deny

## 2020-01-05 ENCOUNTER — Other Ambulatory Visit: Payer: Self-pay | Admitting: Family Medicine

## 2020-01-05 DIAGNOSIS — E7800 Pure hypercholesterolemia, unspecified: Secondary | ICD-10-CM

## 2020-01-05 DIAGNOSIS — N946 Dysmenorrhea, unspecified: Secondary | ICD-10-CM

## 2020-01-06 ENCOUNTER — Other Ambulatory Visit (INDEPENDENT_AMBULATORY_CARE_PROVIDER_SITE_OTHER): Payer: 59

## 2020-01-06 DIAGNOSIS — N946 Dysmenorrhea, unspecified: Secondary | ICD-10-CM | POA: Diagnosis not present

## 2020-01-06 DIAGNOSIS — E7800 Pure hypercholesterolemia, unspecified: Secondary | ICD-10-CM | POA: Diagnosis not present

## 2020-01-06 LAB — CBC WITH DIFFERENTIAL/PLATELET
Basophils Absolute: 0.1 10*3/uL (ref 0.0–0.1)
Basophils Relative: 0.5 % (ref 0.0–3.0)
Eosinophils Absolute: 0.4 10*3/uL (ref 0.0–0.7)
Eosinophils Relative: 3.6 % (ref 0.0–5.0)
HCT: 43.4 % (ref 36.0–46.0)
Hemoglobin: 14.6 g/dL (ref 12.0–15.0)
Lymphocytes Relative: 21.4 % (ref 12.0–46.0)
Lymphs Abs: 2.1 10*3/uL (ref 0.7–4.0)
MCHC: 33.7 g/dL (ref 30.0–36.0)
MCV: 89.8 fl (ref 78.0–100.0)
Monocytes Absolute: 0.5 10*3/uL (ref 0.1–1.0)
Monocytes Relative: 4.7 % (ref 3.0–12.0)
Neutro Abs: 7 10*3/uL (ref 1.4–7.7)
Neutrophils Relative %: 69.8 % (ref 43.0–77.0)
Platelets: 168 10*3/uL (ref 150.0–400.0)
RBC: 4.83 Mil/uL (ref 3.87–5.11)
RDW: 14 % (ref 11.5–15.5)
WBC: 10 10*3/uL (ref 4.0–10.5)

## 2020-01-06 LAB — LIPID PANEL
Cholesterol: 127 mg/dL (ref 0–200)
HDL: 28 mg/dL — ABNORMAL LOW (ref >39.00)
LDL Cholesterol: 78 mg/dL (ref 0–99)
NonHDL: 98.64
Total CHOL/HDL Ratio: 5
Triglycerides: 103 mg/dL (ref 0.0–149.0)
VLDL: 20.6 mg/dL (ref 0.0–40.0)

## 2020-01-06 LAB — COMPREHENSIVE METABOLIC PANEL
ALT: 14 U/L (ref 0–35)
AST: 12 U/L (ref 0–37)
Albumin: 4.1 g/dL (ref 3.5–5.2)
Alkaline Phosphatase: 70 U/L (ref 39–117)
BUN: 10 mg/dL (ref 6–23)
CO2: 26 mEq/L (ref 19–32)
Calcium: 8.8 mg/dL (ref 8.4–10.5)
Chloride: 105 mEq/L (ref 96–112)
Creatinine, Ser: 0.76 mg/dL (ref 0.40–1.20)
GFR: 86.25 mL/min (ref >60.00)
Glucose, Bld: 105 mg/dL — ABNORMAL HIGH (ref 70–99)
Potassium: 4.1 mEq/L (ref 3.5–5.1)
Sodium: 139 mEq/L (ref 135–145)
Total Bilirubin: 0.6 mg/dL (ref 0.2–1.2)
Total Protein: 6.4 g/dL (ref 6.0–8.3)

## 2020-01-13 ENCOUNTER — Encounter: Payer: Self-pay | Admitting: Family Medicine

## 2020-01-13 ENCOUNTER — Other Ambulatory Visit: Payer: Self-pay

## 2020-01-13 ENCOUNTER — Ambulatory Visit (INDEPENDENT_AMBULATORY_CARE_PROVIDER_SITE_OTHER): Payer: 59 | Admitting: Family Medicine

## 2020-01-13 VITALS — BP 126/70 | HR 87 | Temp 97.2°F | Ht 65.0 in | Wt 294.1 lb

## 2020-01-13 DIAGNOSIS — N644 Mastodynia: Secondary | ICD-10-CM

## 2020-01-13 DIAGNOSIS — E7800 Pure hypercholesterolemia, unspecified: Secondary | ICD-10-CM

## 2020-01-13 DIAGNOSIS — Z6841 Body Mass Index (BMI) 40.0 and over, adult: Secondary | ICD-10-CM

## 2020-01-13 DIAGNOSIS — F339 Major depressive disorder, recurrent, unspecified: Secondary | ICD-10-CM

## 2020-01-13 DIAGNOSIS — N63 Unspecified lump in unspecified breast: Secondary | ICD-10-CM

## 2020-01-13 DIAGNOSIS — F410 Panic disorder [episodic paroxysmal anxiety] without agoraphobia: Secondary | ICD-10-CM

## 2020-01-13 NOTE — Progress Notes (Signed)
Subjective:    Patient ID: Yesenia Beasley, female    DOB: [birth year], 34 y.o.   MRN: [id number]  HPI Chief Complaint  Patient presents with  . Follow-up    3 month follow up on labs and medications. patient states that she has a painful/tender to touch lump on left breast x 1-2 months.    This is a 34 yo female who presents today with above cc.  Had kidney stone 1/21, ruptured ovarian cyst since last visit. Coping better. Fewer panic attacks.   Obesity- has been walking on treadmill last couple of months. Continues to have trouble with quality sleep; has elderly dog that wakes her up through the night. New role in her work, more work, [employer]. Eating 2 meals a day and snacks.   Breast pain- in last month, area redness left breast. Has liletta 52 IUD, worsening symptoms since insertion 9/21. Is considering have removed by gyn.   Hyperlipidemia- tolerating atorvastatin 10 mg (was prescribed 20, but was confused about dose and has been taking 10 mg).  Labs done prior to visit today demonstrate significantly improved with LDL 78.   Review of Systems Per HPI    Objective:   Physical Exam Vitals reviewed.  Constitutional:      General: She is not in acute distress.    Appearance: She is obese. She is not ill-appearing, toxic-appearing or diaphoretic.  HENT:     Head: Normocephalic and atraumatic.  Eyes:     Conjunctiva/sclera: Conjunctivae normal.  Cardiovascular:     Rate and Rhythm: Normal rate and regular rhythm.     Heart sounds: Normal heart sounds.  Pulmonary:     Effort: Pulmonary effort is normal.     Breath sounds: Normal breath sounds.  Chest:     Breasts:        Right: Tenderness present. No bleeding, inverted nipple, mass, nipple discharge or skin change.        Left: Skin change (two small areas left upper quadrant with ? resolving vesicles, open papule. No erythema or drainage. ) and tenderness present. No bleeding, inverted nipple, mass or nipple discharge.   Comments: Patient examined in sitting and supine positions. Left breast slightly larger in sitting position.  Musculoskeletal:     Right lower leg: No edema.     Left lower leg: No edema.  Lymphadenopathy:     Upper Body:     Right upper body: No supraclavicular, axillary or pectoral adenopathy.     Left upper body: No supraclavicular, axillary or pectoral adenopathy.  Skin:    General: Skin is warm and dry.  Neurological:     Mental Status: She is alert and oriented to person, place, and time.  Psychiatric:        Mood and Affect: Mood normal.        Behavior: Behavior normal.        Thought Content: Thought content normal.        Judgment: Judgment normal.       BP 126/70   Pulse 87   Temp (!) 97.2 F (36.2 C) (Tympanic)   Ht 5\' 5"  (1.651 m)   Wt 294 lb 1.6 oz (133.4 kg)   SpO2 95%   BMI 48.94 kg/m  Wt Readings from Last 3 Encounters:  01/13/20 294 lb 1.6 oz (133.4 kg)  11/21/19 280 lb (127 kg)  10/06/19 280 lb (127 kg)      Assessment & Plan:  1. Breast pain in female -  MM DIAG BREAST TOMO BILATERAL; Future - US BREAST LTD UNI LEFT INC AXILLA; Future - US BREAST LTD UNI RIGHT INC AXILLA; Future  2. Lump or mass in breast - patient has palpated lump in left breast, I did not palpate it today, but feel it warrants further work up with imaging - MM DIAG BREAST TOMO BILATERAL; Future - US BREAST LTD UNI LEFT INC AXILLA; Future - US BREAST LTD UNI RIGHT INC AXILLA; Future  3. Elevated LDL cholesterol level - improved on statin, continue atorvastatin  4. Morbid obesity with body mass index (BMI) of 45.0 to 49.9 in adult [facility]) - had long discussion about barriers to healthy food choices, provided written and verbal information about healthy food choices along with impact of sleep and stress  5. 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 Panic disorder - fewer  episodes, feels like she has acquired more coping mechanisms  - follow up in 8 weeks  This visit occurred during the SARS-CoV-2 public health emergency.  Safety protocols were in place, including screening questions prior to the visit, additional usage of staff PPE, and extensive cleaning of exam room while observing appropriate contact time as indicated for disinfecting solutions.     Clarene Reamer, FNP-BC  [facility]  01/14/2020 10:24 AM

## 2020-01-13 NOTE — Patient Instructions (Addendum)
Good to see you today, you will get a call about mammogram/ ultrasound of breasts  Follow up in 8 weeks  A resource that I like is www.dietdoctor.com  You tube- Dr. Sharman Cheek, Dr. Enrigue Catena, Dr. Alvester Chou, Dr. Koren Bound  Here are some guidelines to help you with meal planning -  Avoid all processed and packaged foods (bread, pasta, crackers, chips, etc) and beverages containing calories.  Avoid added sugars and excessive natural sugars.  Attention to how you feel if you consume artificial sweeteners.  Do they make you more hungry or raise your blood sugar?  With every meal and snack, aim to get 20 g of protein (3 ounces of meat, 4 ounces of fish, 3 eggs, protein powder, 1 cup [place] yogurt, 1 cup cottage cheese, etc.)  Increase fiber in the form of non-starchy vegetables.  These help you feel full with very little carbohydrates and are good for gut health.  Eat 1 serving healthy carb per meal- 1/2 cup brown rice, beans, potato, corn- pay attention to whether or not this significantly raises your blood sugar. If it does, reduce the frequency you consume these.   Eat 2-3 servings of lower sugar fruits daily.  This includes berries, apples, oranges, peaches, pears, one half banana.  Have small amounts of good fats such as avocado, nuts, olive oil, nut butters, olives.  Add a little cheese to your salads to make them tasty.

## 2020-01-14 ENCOUNTER — Encounter: Payer: Self-pay | Admitting: Family Medicine

## 2020-01-23 ENCOUNTER — Ambulatory Visit
Admission: RE | Admit: 2020-01-23 | Discharge: 2020-01-23 | Disposition: A | Discharge status: Home / Self Care | Payer: 59 | Source: Ambulatory Visit | Attending: Family Medicine | Admitting: Family Medicine

## 2020-01-23 ENCOUNTER — Ambulatory Visit: Payer: 59

## 2020-01-23 DIAGNOSIS — N644 Mastodynia: Secondary | ICD-10-CM

## 2020-01-23 DIAGNOSIS — N63 Unspecified lump in unspecified breast: Secondary | ICD-10-CM | POA: Diagnosis present

## 2020-01-23 IMAGING — US US BREAST*L* LIMITED INC AXILLA
1 series · 2 of 2 positions shown · non-contrast
Comparison: None.

CLINICAL DATA: 33-year-old female presenting with a focal area of
tenderness and lump in the upper outer left breast.

EXAM:
DIGITAL DIAGNOSTIC BILATERAL MAMMOGRAM WITH CAD AND TOMO
ULTRASOUND LEFT BREAST

[Series 1: us breast*left* limited inc axilla · 0.09mm/px · 2 of 2 slices shown]
[im 1/2]
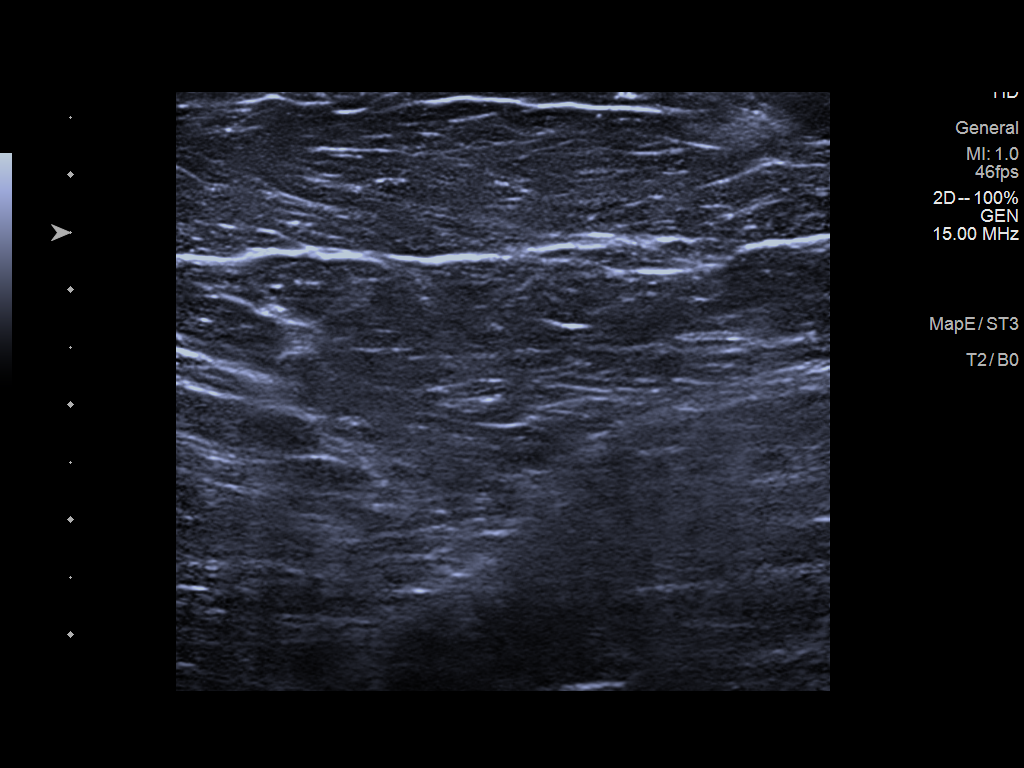
[im 2/2]
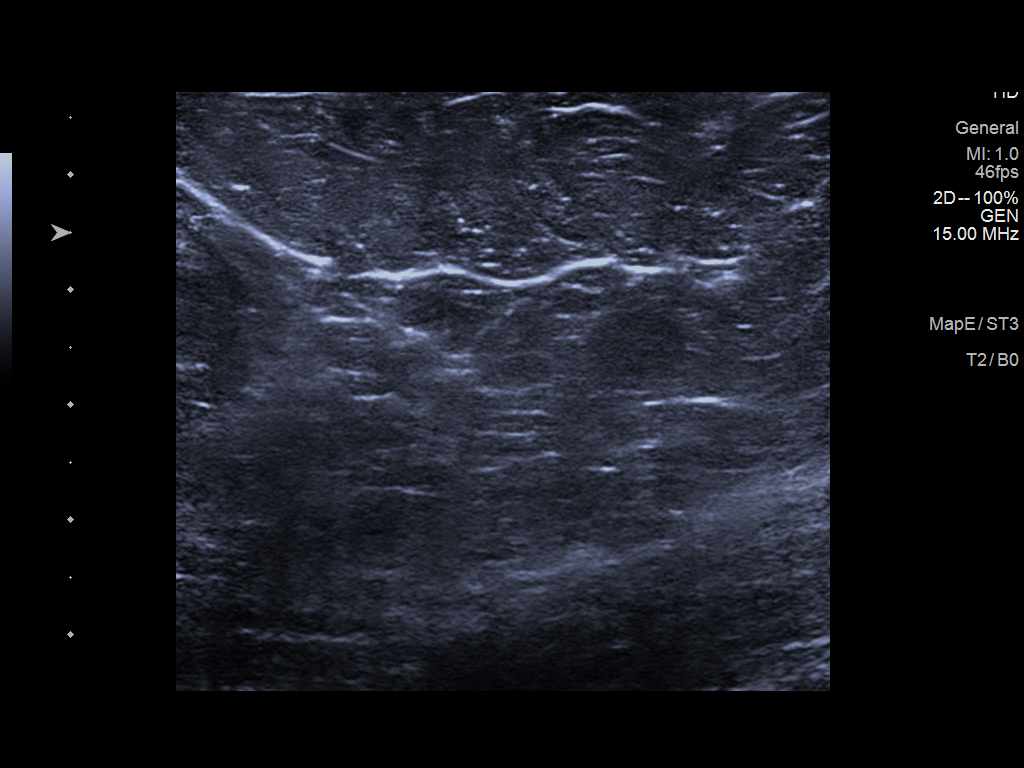

[2 of 2 positions shown; findings below may reference images not displayed]

ACR Breast Density Category b: There are scattered areas of
fibroglandular density.
FINDINGS: Mammogram:

Right breast: No suspicious mass, distortion, or microcalcifications
are identified to suggest presence of malignancy.

Left breast: Spot compression tomosynthesis views at the palpable
site of concern were performed in addition to standard views. There
is no abnormality in this area or elsewhere in the left breast.

Mammographic images were processed with CAD.

On physical exam, I palpate a ridge of tissue at the site of concern
reported by the patient in the superior left breast. No fixed
discrete mass.

Ultrasound:

Targeted ultrasound is performed throughout the superior aspect of
the left breast demonstrating no suspicious cystic or solid mass.
There is only normal fibroglandular tissue.
IMPRESSION: No mammographic or sonographic evidence of malignancy at the
palpable/tender site reported by the patient in the superior left
breast. No mammographic evidence of malignancy in the right breast.

RECOMMENDATION:
1. Clinical follow-up as needed for the left breast. We discussed
some of the issues regarding breast pain, including wearing adequate
support and vitamin-E supplementation.

2.  Begin annual screening mammography at age 40.

I have discussed the findings and recommendations with the patient.
If applicable, a reminder letter will be sent to the patient
regarding the next appointment.

BI-RADS CATEGORY  1: Negative.

## 2020-01-23 IMAGING — MG DIGITAL DIAGNOSTIC BILAT W/ TOMO W/ CAD
8 of 14 series · 8 of 40 positions shown · non-contrast
Comparison: None.

CLINICAL DATA: 33-year-old female presenting with a focal area of
tenderness and lump in the upper outer left breast.

EXAM:
DIGITAL DIAGNOSTIC BILATERAL MAMMOGRAM WITH CAD AND TOMO
ULTRASOUND LEFT BREAST

[L CC synth-2D (1 of 2)]
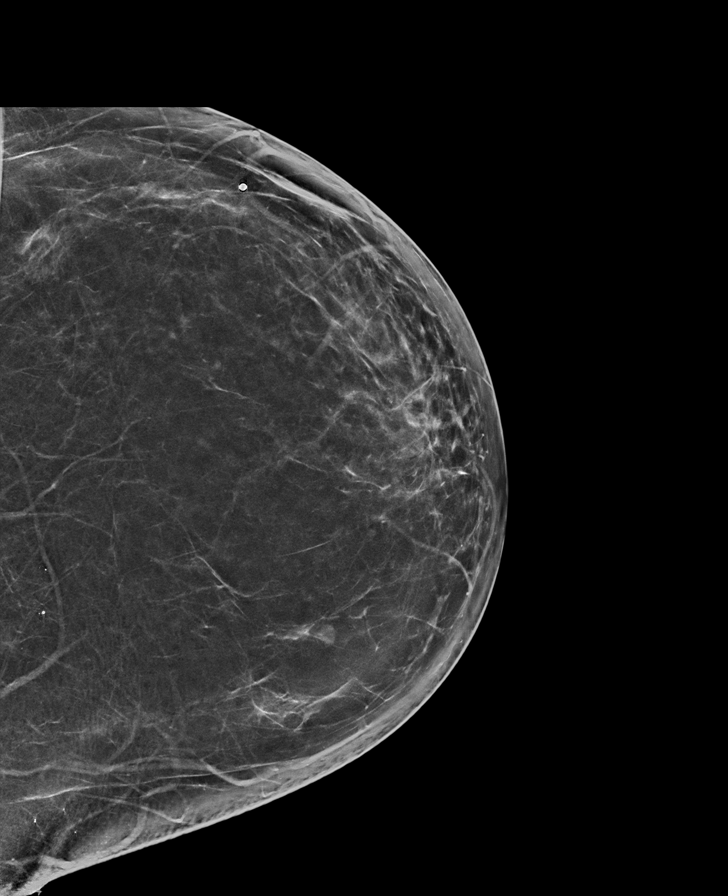

[R MLO synth-2D]
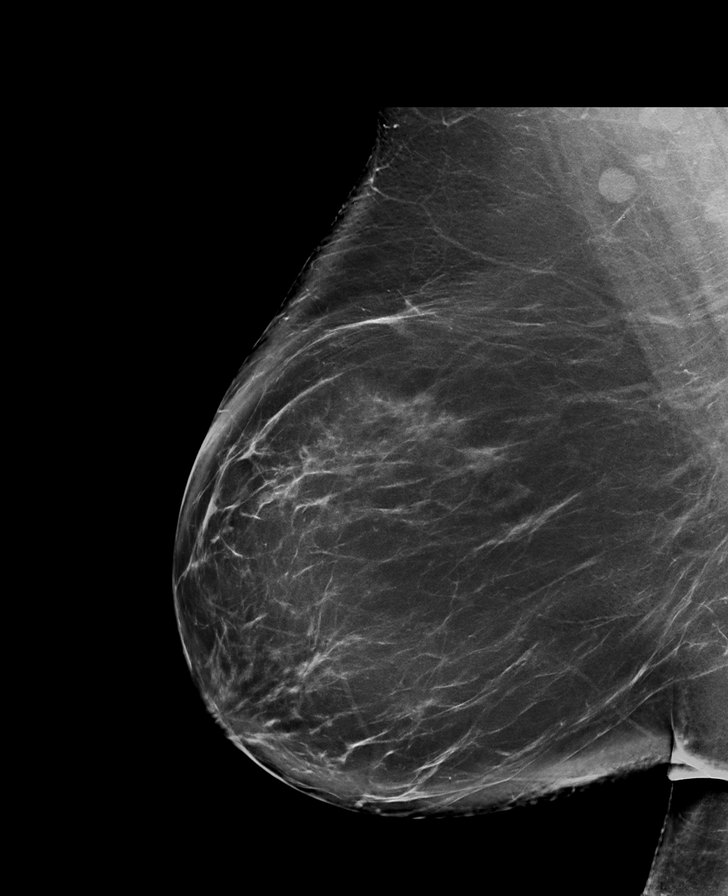

[L MLO synth-2D]
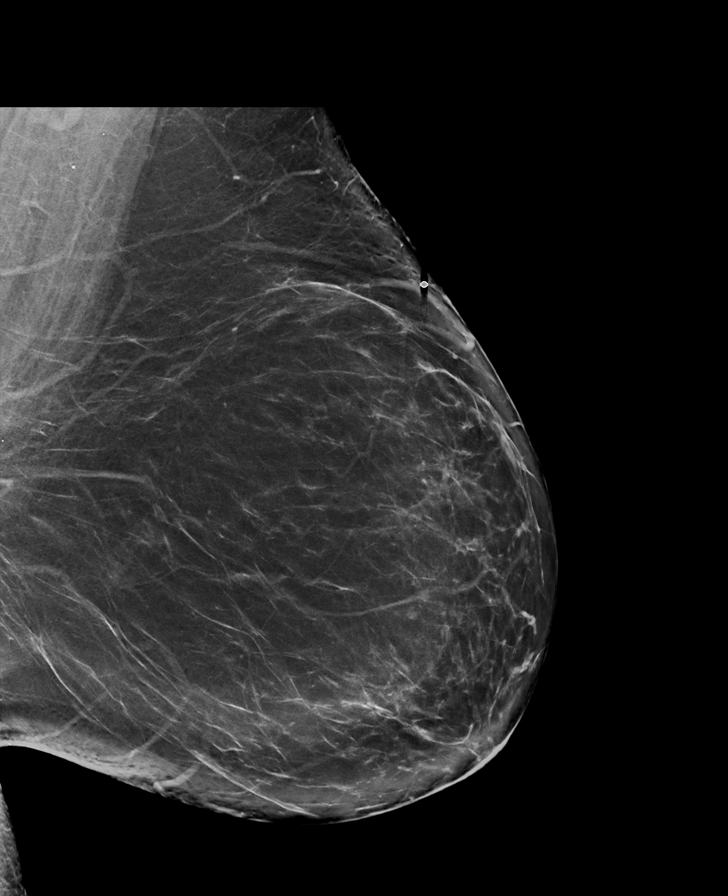

[L CC synth-2D (2 of 2)]
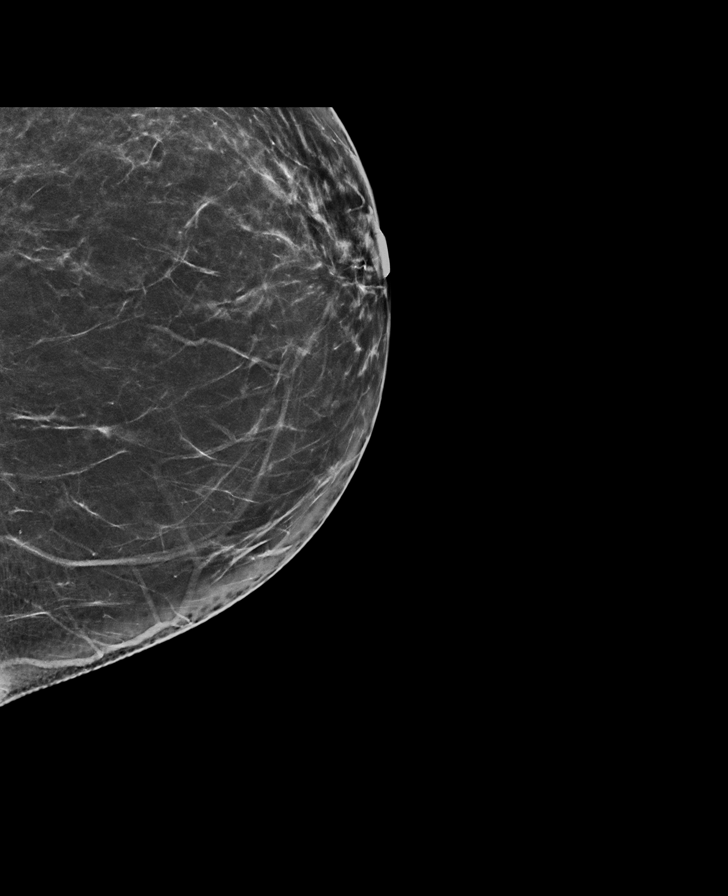

[L TAN synth-2D]
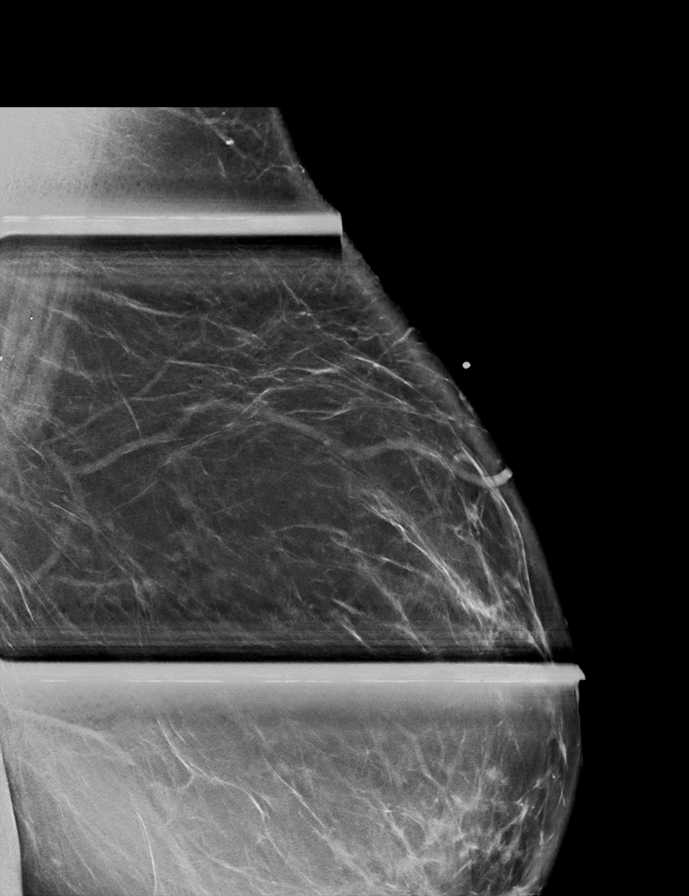

[R CC synth-2D (1 of 2)]
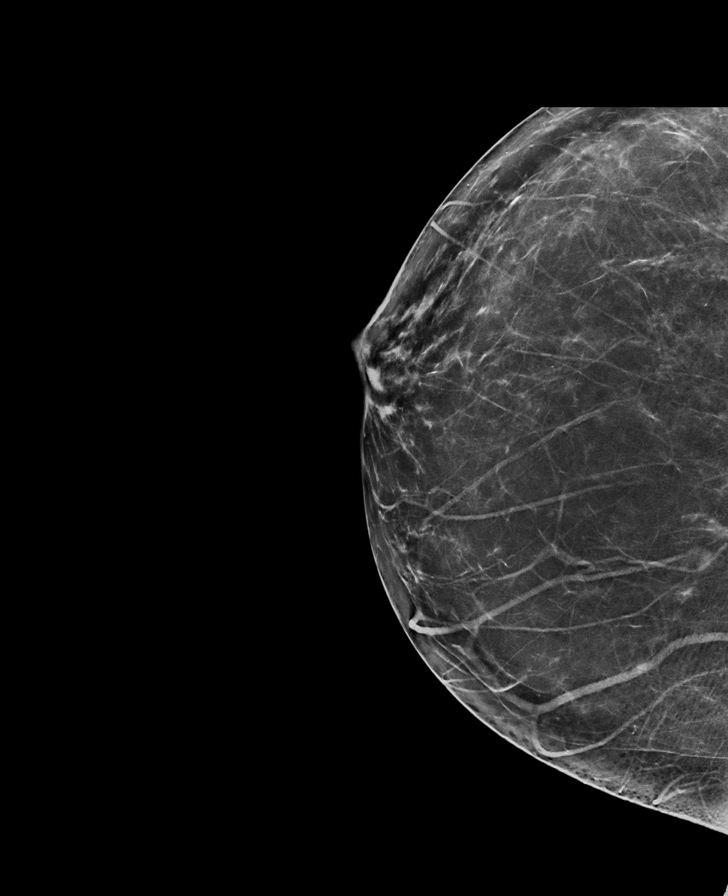

[R CC synth-2D (2 of 2)]
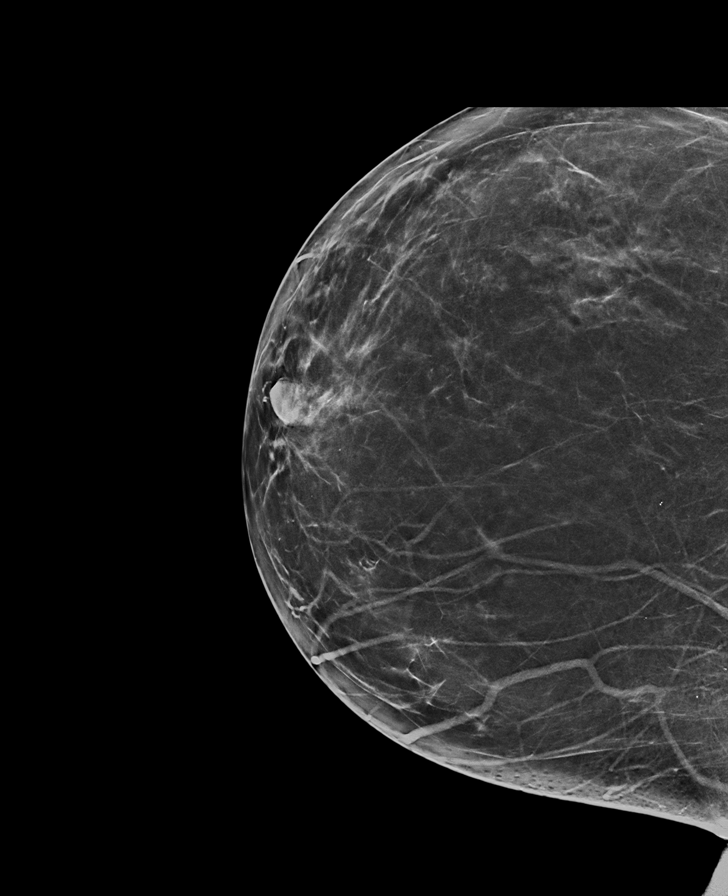

[L MLO tomo · tomo slice 49/98.0]
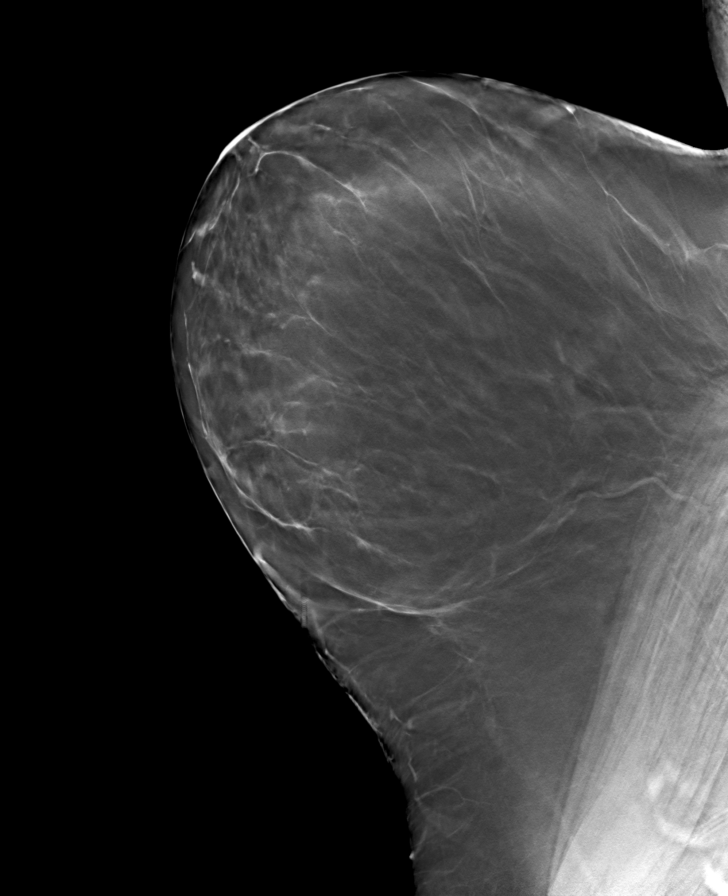

[8 of 40 positions shown; findings below may reference images not displayed]

ACR Breast Density Category b: There are scattered areas of
fibroglandular density.
FINDINGS: Mammogram:

Right breast: No suspicious mass, distortion, or microcalcifications
are identified to suggest presence of malignancy.

Left breast: Spot compression tomosynthesis views at the palpable
site of concern were performed in addition to standard views. There
is no abnormality in this area or elsewhere in the left breast.

Mammographic images were processed with CAD.

On physical exam, I palpate a ridge of tissue at the site of concern
reported by the patient in the superior left breast. No fixed
discrete mass.

Ultrasound:

Targeted ultrasound is performed throughout the superior aspect of
the left breast demonstrating no suspicious cystic or solid mass.
There is only normal fibroglandular tissue.
IMPRESSION: No mammographic or sonographic evidence of malignancy at the
palpable/tender site reported by the patient in the superior left
breast. No mammographic evidence of malignancy in the right breast.

RECOMMENDATION:
1. Clinical follow-up as needed for the left breast. We discussed
some of the issues regarding breast pain, including wearing adequate
support and vitamin-E supplementation.

2.  Begin annual screening mammography at age 38.

I have discussed the findings and recommendations with the patient.
If applicable, a reminder letter will be sent to the patient
regarding the next appointment.

BI-RADS CATEGORY  1: Negative.

## 2020-01-26 ENCOUNTER — Other Ambulatory Visit: Payer: Self-pay | Admitting: Family Medicine

## 2020-01-26 DIAGNOSIS — F410 Panic disorder [episodic paroxysmal anxiety] without agoraphobia: Secondary | ICD-10-CM

## 2020-01-30 NOTE — Telephone Encounter (Signed)
Last filled 10/31/19 #60 x 2 refills  Upcoming appt With Family Medicine Elby Beck, FNP) 03/09/2020 at 8:00 AM  Please advise, thanks.

## 2020-02-13 ENCOUNTER — Encounter: Payer: Self-pay | Admitting: Family Medicine

## 2020-02-19 ENCOUNTER — Other Ambulatory Visit: Payer: Self-pay | Admitting: Family Medicine

## 2020-02-19 DIAGNOSIS — J4520 Mild intermittent asthma, uncomplicated: Secondary | ICD-10-CM

## 2020-02-27 ENCOUNTER — Other Ambulatory Visit: Payer: Self-pay | Admitting: Family Medicine

## 2020-02-27 DIAGNOSIS — N946 Dysmenorrhea, unspecified: Secondary | ICD-10-CM

## 2020-02-27 MED ORDER — CLONAZEPAM 0.5 MG PO TABS: 0.2500 mg | ORAL_TABLET | Freq: Two times a day (BID) | ORAL | 0 refills | Status: DC | PRN

## 2020-02-27 MED ORDER — HYDROXYZINE HCL 25 MG PO TABS: 12.5000 mg | ORAL_TABLET | Freq: Three times a day (TID) | ORAL | 0 refills | Status: DC | PRN

## 2020-03-02 ENCOUNTER — Other Ambulatory Visit (INDEPENDENT_AMBULATORY_CARE_PROVIDER_SITE_OTHER): Payer: 59

## 2020-03-02 DIAGNOSIS — N946 Dysmenorrhea, unspecified: Secondary | ICD-10-CM

## 2020-03-02 LAB — TSH: TSH: 2.35 u[IU]/mL (ref 0.35–4.50)

## 2020-03-09 ENCOUNTER — Ambulatory Visit (INDEPENDENT_AMBULATORY_CARE_PROVIDER_SITE_OTHER): Payer: 59 | Admitting: Family Medicine

## 2020-03-09 ENCOUNTER — Encounter: Payer: Self-pay | Admitting: Family Medicine

## 2020-03-09 ENCOUNTER — Other Ambulatory Visit: Payer: Self-pay

## 2020-03-09 VITALS — BP 110/78 | HR 86 | Temp 97.9°F | Wt 289.0 lb

## 2020-03-09 DIAGNOSIS — F411 Generalized anxiety disorder: Secondary | ICD-10-CM | POA: Diagnosis not present

## 2020-03-09 DIAGNOSIS — F410 Panic disorder [episodic paroxysmal anxiety] without agoraphobia: Secondary | ICD-10-CM | POA: Diagnosis not present

## 2020-03-09 DIAGNOSIS — Z6841 Body Mass Index (BMI) 40.0 and over, adult: Secondary | ICD-10-CM | POA: Diagnosis not present

## 2020-03-09 MED ORDER — ALPRAZOLAM 0.5 MG PO TABS: 0.5000 mg | ORAL_TABLET | Freq: Two times a day (BID) | ORAL | 0 refills | Status: DC | PRN

## 2020-03-09 NOTE — Patient Instructions (Signed)
Look at long acting melatonin- Remfresh. Avoid the ones with added ingredients.   Follow up in 3 months.

## 2020-03-09 NOTE — Progress Notes (Signed)
Subjective:    Patient ID: Yesenia Beasley, female    DOB: [birth year], 34 y.o.   MRN: [id number]  HPI Chief Complaint  Patient presents with  . Follow-up   This is a 34 yo female who presents today for follow up.   GAD with panic attacks- we tried to switch her to clonazepam from alprazolam, she took her regular alprazolam and the next day started clonazepam, had severe symptoms of flu like illness. Tried for several day but was unable to tolerate and had to go back to alprazolam. She has upcoming appointment with psych, couldn't get in until August. She had recently had her IUD removed which she felt like that caused alteration in hormones. Job crazy, getting ready to change responsibilities. Should be better for her. Trouble staying asleep. Has been on multiple prescription sleep med with no improvement.   Obesity- eating healthier, some increased GERD when anxiety was worse.  Has lost 5 pounds since last visit.  Review of Systems     Objective:   Physical Exam Vitals reviewed.  Constitutional:      General: She is not in acute distress.    Appearance: Normal appearance. She is obese. She is not ill-appearing, toxic-appearing or diaphoretic.  HENT:     Head: Normocephalic and atraumatic.  Eyes:     Conjunctiva/sclera: Conjunctivae normal.  Cardiovascular:     Rate and Rhythm: Normal rate.  Pulmonary:     Effort: Pulmonary effort is normal.  Neurological:     Mental Status: She is alert and oriented to person, place, and time.  Psychiatric:        Mood and Affect: Mood normal.        Behavior: Behavior normal.        Thought Content: Thought content normal.        Judgment: Judgment normal.       BP 110/78 (BP Location: Left Arm, Patient Position: Sitting, Cuff Size: Large)   Pulse 86   Temp 97.9 F (36.6 C) (Temporal)   Wt 289 lb (131.1 kg)   SpO2 95%   BMI 48.09 kg/m  Wt Readings from Last 3 Encounters:  03/09/20 289 lb (131.1 kg)  01/13/20 294 lb 1.6 oz (133.4  kg)  11/21/19 280 lb (127 kg)   GAD 7 : Generalized Anxiety Score 03/09/2020  Nervous, Anxious, on Edge 2  Control/stop worrying 2  Worry too much - different things 2  Trouble relaxing 2  Restless 1  Easily annoyed or irritable 0  Afraid - awful might happen 2  Total GAD 7 Score 11  Anxiety Difficulty Somewhat difficult   Depression screen [facility] 2/9 03/09/2020 07/22/2019 04/27/2019 07/14/2018 01/06/2018  Decreased Interest 1 0 1 0 1  Down, Depressed, Hopeless 1 0 1 0 1  PHQ - 2 Score 2 0 2 0 2  Altered sleeping 1 - 3 - 3  Tired, decreased energy 1 - 1 - 2  Change in appetite 2 - 0 - 2  Feeling bad or failure about yourself  0 - 0 - 0  Trouble concentrating 1 - 0 - 1  Moving slowly or fidgety/restless 1 - 0 - 1  Suicidal thoughts 0 - 0 - 0  PHQ-9 Score 8 - 6 - 11  Difficult doing work/chores Somewhat difficult - Somewhat difficult - Somewhat difficult       Assessment & Plan:  1. Generalized anxiety disorder with panic attacks -Unfortunately, she was unable to tolerate transition from alprazolam to clonazepam.  She is feeling a little better as some things have changed over the last month for her.  She is still awaiting psych appointment at the end of next month.  At this point, we will continue alprazolam.  Encouraged her to continue nonmedication coping strategies. - ALPRAZolam (XANAX) 0.5 MG tablet; Take 1 tablet (0.5 mg total) by mouth 2 (two) times daily as needed for anxiety.  Dispense: 60 tablet; Refill: 0 -She was provided information for scheduling therapy  2. Morbid obesity with body mass index (BMI) of 45.0 to 49.9 in adult [facility]) -Encouraged her efforts to make healthy food choices, increase activity as tolerated especially outdoors  - Over 25 minutes were spent face-to-face with the patient during this encounter and >50% of that time was spent on counseling and coordination of care   -Follow-up in 3 months  This visit occurred during the SARS-CoV-2 public health  emergency.  Safety protocols were in place, including screening questions prior to the visit, additional usage of staff PPE, and extensive cleaning of exam room while observing appropriate contact time as indicated for disinfecting solutions.     Clarene Reamer, FNP-BC  [facility] [place]  03/09/2020 10:39 AM

## 2020-03-15 ENCOUNTER — Encounter: Payer: Self-pay | Admitting: Family Medicine

## 2020-03-16 ENCOUNTER — Encounter: Payer: Self-pay | Admitting: Family Medicine

## 2020-04-05 ENCOUNTER — Other Ambulatory Visit: Payer: Self-pay | Admitting: Family Medicine

## 2020-04-05 DIAGNOSIS — F410 Panic disorder [episodic paroxysmal anxiety] without agoraphobia: Secondary | ICD-10-CM

## 2020-04-05 NOTE — Telephone Encounter (Signed)
Last refilled 03/09/20 #60 x 0 refills. Upcoming appt With Family Medicine Elby Beck, FNP) 06/08/2020 at 8:00 AM   Please advise, thanks.

## 2020-04-16 ENCOUNTER — Other Ambulatory Visit: Payer: Self-pay | Admitting: Family Medicine

## 2020-04-16 DIAGNOSIS — J4520 Mild intermittent asthma, uncomplicated: Secondary | ICD-10-CM

## 2020-05-03 ENCOUNTER — Other Ambulatory Visit: Payer: Self-pay | Admitting: Family Medicine

## 2020-05-03 DIAGNOSIS — F410 Panic disorder [episodic paroxysmal anxiety] without agoraphobia: Secondary | ICD-10-CM

## 2020-05-04 ENCOUNTER — Other Ambulatory Visit: Payer: Self-pay | Admitting: Family Medicine

## 2020-05-04 DIAGNOSIS — F410 Panic disorder [episodic paroxysmal anxiety] without agoraphobia: Secondary | ICD-10-CM

## 2020-05-04 DIAGNOSIS — F339 Major depressive disorder, recurrent, unspecified: Secondary | ICD-10-CM

## 2020-05-04 NOTE — Telephone Encounter (Signed)
Last refilled 04/06/20 Xanax #60 x 0 refills. Upcoming appt With Family Medicine Yesenia Beck, FNP) 06/08/2020 at 8:00 AM

## 2020-05-08 ENCOUNTER — Other Ambulatory Visit: Payer: Self-pay | Admitting: Family Medicine

## 2020-05-08 DIAGNOSIS — J4520 Mild intermittent asthma, uncomplicated: Secondary | ICD-10-CM

## 2020-06-08 ENCOUNTER — Encounter: Payer: Self-pay | Admitting: Family Medicine

## 2020-06-08 ENCOUNTER — Other Ambulatory Visit: Payer: Self-pay

## 2020-06-08 ENCOUNTER — Ambulatory Visit (INDEPENDENT_AMBULATORY_CARE_PROVIDER_SITE_OTHER): Payer: 59 | Admitting: Family Medicine

## 2020-06-08 VITALS — BP 132/84 | HR 97 | Temp 97.1°F | Ht 65.0 in | Wt 303.5 lb

## 2020-06-08 DIAGNOSIS — F410 Panic disorder [episodic paroxysmal anxiety] without agoraphobia: Secondary | ICD-10-CM

## 2020-06-08 DIAGNOSIS — G4700 Insomnia, unspecified: Secondary | ICD-10-CM

## 2020-06-08 NOTE — Patient Instructions (Addendum)
Look at [employer] for the 4 tendencies  Continue to watch starches. Increase protein, veggies- plan your lunches so you can get at least 2 meals in.   Try your coffee with a little heavy cream

## 2020-06-08 NOTE — Progress Notes (Signed)
Subjective:    Patient ID: Yesenia Beasley, female    DOB: 14-May-1986, 34 y.o.   MRN: [id number]  HPI Chief Complaint  Patient presents with  . Follow-up    anxiety/depression   This is a 34 yo female who presents today for follow up of anxiety and depression. Has been seeing Dr. Dalene Seltzer, psychiatrist. Was started on effexor xr 75 mg. Has been trying to wean alprazolam, tried to take 1/2 of 0.5 mg dose bid, increased panic. Now trying 0.5/ 0.25. Plans to touch base with psychiatrist about slower wean.   Has gained weight. Not sleeping well. OK going to sleep, can't stay asleep. 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 Generally working around 40 hours, supportive management. Has tried [place], trazodone, Lunesta in past. Currently working with neuro for headaches and is going to have eval for sleep apnea.   Doing Home [employer]. Orders low carb, meat, veggies. Eating one meal a day, snacks. Good water intake. Drinks coffee, cashew milk, no soda/ sweet tea/ juice. Does not have strong sense of taste/ smell, likes crunchy foods, avoids sweets.   Had short period last month. Three days.   Review of Systems Per HPI    Objective:   Physical Exam Physical Exam  Vitals reviewed. Constitutional: Oriented to person, place, and time. Appears well-developed and well-nourished.  HENT:  Head: Normocephalic and atraumatic.  Eyes: Conjunctivae are normal.  Neck: Normal range of motion. Neck supple.  Cardiovascular: Normal rate.   Pulmonary/Chest: Effort normal.  Musculoskeletal: Normal range of motion.  Neurological: Alert and oriented to person, place, and time.  Psychiatric: Normal mood and affect. Behavior is normal. Judgment and thought content normal. Occasionally tearful.     BP 132/84   Pulse 97   Temp (!) 97.1 F (36.2 C) (Temporal)   Ht 5\' 5"  (1.651 m)   Wt (!) 303 lb 8 oz (137.7 kg)   SpO2 97%   BMI 50.51 kg/m  Wt Readings from Last 3  Encounters:  06/08/20 (!) 303 lb 8 oz (137.7 kg)  03/09/20 289 lb (131.1 kg)  01/13/20 294 lb 1.6 oz (133.4 kg)   Depression screen [facility] 2/9 06/08/2020 03/09/2020 07/22/2019 04/27/2019 07/14/2018  Decreased Interest 0 1 0 1 0  Down, Depressed, Hopeless 0 1 0 1 0  PHQ - 2 Score 0 2 0 2 0  Altered sleeping - 1 - 3 -  Tired, decreased energy - 1 - 1 -  Change in appetite - 2 - 0 -  Feeling bad or failure about yourself  - 0 - 0 -  Trouble concentrating - 1 - 0 -  Moving slowly or fidgety/restless - 1 - 0 -  Suicidal thoughts - 0 - 0 -  PHQ-9 Score - 8 - 6 -  Difficult doing work/chores - Somewhat difficult - Somewhat difficult -   GAD 7 : Generalized Anxiety Score 06/08/2020 03/09/2020  Nervous, Anxious, on Edge 1 2  Control/stop worrying 1 2  Worry too much - different things 1 2  Trouble relaxing 0 2  Restless 0 1  Easily annoyed or irritable 1 0  Afraid - awful might happen 1 2  Total GAD 7 Score 5 11  Anxiety Difficulty Somewhat difficult Somewhat difficult         Assessment & Plan:  1. Panic disorder - currently seeing psychiatry, discussed realistic expectations with medication, non pharmacologic interventions.  - follow up in 2 months  2. Morbid obesity with body mass index (BMI) greater than or equal to 50 (HCC) - discussed effects of sleep/ stress, encouraged her to increase size of meals, avoid snacking, shorten eating window  3. Insomnia, unspecified type - following with neurology  Over 30 minutes were spent face-to-face with the patient during this encounter or in coordination of care, reviewing past records, time spent documenting, lab results or radiology, or educating the patient or family.   This visit occurred during the SARS-CoV-2 public health emergency.  Safety protocols were in place, including screening questions prior to the visit, additional usage of staff PPE, and extensive cleaning of exam room while observing appropriate contact time as indicated for  disinfecting solutions.      Clarene Reamer, FNP-BC  [facility]  06/08/2020 9:02 AM

## 2020-07-17 ENCOUNTER — Other Ambulatory Visit: Payer: Self-pay | Admitting: Family Medicine

## 2020-07-17 DIAGNOSIS — E7800 Pure hypercholesterolemia, unspecified: Secondary | ICD-10-CM

## 2020-07-17 NOTE — Telephone Encounter (Signed)
Pharmacy requests refill on: atorvastatin 20 mg   LAST REFILL: 07/22/2019 LAST OV: 06/08/2020 NEXT OV: 08/01/2020 PHARMACY: [id number] [place], [place]

## 2020-07-20 ENCOUNTER — Encounter: Payer: Self-pay | Admitting: Family Medicine

## 2020-07-23 ENCOUNTER — Other Ambulatory Visit: Payer: Self-pay | Admitting: Family Medicine

## 2020-07-23 DIAGNOSIS — J4520 Mild intermittent asthma, uncomplicated: Secondary | ICD-10-CM

## 2020-07-28 ENCOUNTER — Other Ambulatory Visit: Payer: Self-pay | Admitting: Family Medicine

## 2020-07-28 ENCOUNTER — Encounter: Payer: Self-pay | Admitting: Family Medicine

## 2020-08-01 ENCOUNTER — Encounter: Payer: Self-pay | Admitting: Family Medicine

## 2020-08-01 ENCOUNTER — Other Ambulatory Visit: Payer: Self-pay

## 2020-08-01 ENCOUNTER — Ambulatory Visit (INDEPENDENT_AMBULATORY_CARE_PROVIDER_SITE_OTHER): Payer: 59 | Admitting: Family Medicine

## 2020-08-01 VITALS — BP 108/64 | HR 85 | Temp 96.5°F | Ht 65.0 in

## 2020-08-01 DIAGNOSIS — L0231 Cutaneous abscess of buttock: Secondary | ICD-10-CM

## 2020-08-01 DIAGNOSIS — R079 Chest pain, unspecified: Secondary | ICD-10-CM

## 2020-08-01 DIAGNOSIS — F410 Panic disorder [episodic paroxysmal anxiety] without agoraphobia: Secondary | ICD-10-CM

## 2020-08-01 DIAGNOSIS — H9319 Tinnitus, unspecified ear: Secondary | ICD-10-CM | POA: Diagnosis not present

## 2020-08-01 MED ORDER — DOXYCYCLINE HYCLATE 100 MG PO CAPS: 100.0000 mg | ORAL_CAPSULE | Freq: Two times a day (BID) | ORAL | 0 refills | Status: AC

## 2020-08-01 NOTE — Patient Instructions (Addendum)
Good to see you today, I am glad you are doing better with your mood  I have sent in a prescription for doxycyline, take with food and full glass of water. Continue warm compresses/ soaks, Hibiclens. Let me know if not better or if worse.   EKG- normal

## 2020-08-01 NOTE — Progress Notes (Signed)
Subjective:    Patient ID: Yesenia Beasley, female    DOB: 30-Sep-1985, 34 y.o.   MRN: [id number]  HPI Chief Complaint  Patient presents with  . 8 week follow-up Anxiety    Feels like she is doing well.  Marland Kitchen Hearing a "wooshing" in ears    Trying to get in with neurologist. Has issues with positional vertigo in the past.   Mental health- seeing psychiatry. Has weaned alprazolam to .25 from 1 mg (total daily dose). Up on effexor.   Has an abscess, history of hidradenitis. On buttocks. Using hibiclens.   Chest pain- radiates into left shoulder, for 1-2 weeks, unknown trigger, a couple of times a day, substernal/ "chest bone," twinge, sometimes pain to back, lasts less than a minute, not positional, no SOB, no diaphoresis. Occasional cough with seasonal allergies.   Night sweats- over last month. Had IUD removed 03/2020. Periods getting more regular, heavier.   Whooshing in right ear- "slow motion helicopter," ? Positional, started last 1-2 weeks. Has had a history of BPPV. Intermittent. Has upcoming appointment with neurology in Jan. Open to ENT appointment.   Review of Systems     Objective:   Physical Exam Vitals reviewed.  Constitutional:      General: She is not in acute distress.    Appearance: Normal appearance. She is not ill-appearing, toxic-appearing or diaphoretic.  HENT:     Head: Normocephalic and atraumatic.     Right Ear: Tympanic membrane, ear canal and external ear normal. No decreased hearing noted.     Left Ear: Tympanic membrane, ear canal and external ear normal. No decreased hearing noted.     Ears:     Weber exam findings: lateralizes left.    Right Rinne: AC > BC.    Left Rinne: AC > BC. Eyes:     Conjunctiva/sclera: Conjunctivae normal.  Neck:     Vascular: No carotid bruit.  Cardiovascular:     Rate and Rhythm: Normal rate and regular rhythm.     Heart sounds: Normal heart sounds.  Pulmonary:     Effort: Pulmonary effort is normal.     Breath sounds:  Normal breath sounds.  Musculoskeletal:     Cervical back: Normal range of motion and neck supple.  Skin:    General: Skin is warm and dry.     Comments: Buttocks with scattered, small pustules. Gluteal cleft with 5 cm area redness, tenderness, scab on surface, non fluctuant.   Neurological:     Mental Status: She is alert and oriented to person, place, and time.       BP 108/64 (BP Location: Left Arm, Patient Position: Sitting, Cuff Size: Large)   Pulse 85   Temp (!) 96.5 F (35.8 C)   Ht 5\' 5"  (1.651 m)   SpO2 97%   BMI 50.51 kg/m  EKG- NSR rate 74, PR 138, QTc 437    Assessment & Plan:  1. Chest pain, unspecified type - low suspicion for cardiac etiology, follow up if persistent, worsening or accompanying symptoms - EKG 12-Lead  2. Abscess of buttock - non fluctuant, will treat with oral antibiotics, RTC precautions reviewed - doxycycline (VIBRAMYCIN) 100 MG capsule; Take 1 capsule (100 mg total) by mouth 2 (two) times daily for 10 days.  Dispense: 20 capsule; Refill: 0  3. Tinnitus, unspecified laterality - not sure if related to her pseudotumor cerebri, will have her seen by ENT  - Ambulatory referral to ENT  4. Panic disorder - doing  better, has been able to wean alprazolam and will continue follow up with psychiatry  This visit occurred during the SARS-CoV-2 public health emergency.  Safety protocols were in place, including screening questions prior to the visit, additional usage of staff PPE, and extensive cleaning of exam room while observing appropriate contact time as indicated for disinfecting solutions.      Clarene Reamer, FNP-BC  [facility]  08/01/2020 9:12 AM

## 2020-08-15 ENCOUNTER — Other Ambulatory Visit: Payer: Self-pay

## 2020-08-15 ENCOUNTER — Encounter: Payer: Self-pay | Admitting: *Deleted

## 2020-08-15 ENCOUNTER — Emergency Department
Admission: EM | Admit: 2020-08-15 | Discharge: 2020-08-15 | Disposition: A | Discharge status: Home / Self Care | Payer: 59 | Attending: Emergency Medicine | Admitting: Emergency Medicine

## 2020-08-15 DIAGNOSIS — Z5321 Procedure and treatment not carried out due to patient leaving prior to being seen by health care provider: Secondary | ICD-10-CM | POA: Insufficient documentation

## 2020-08-15 DIAGNOSIS — R1030 Lower abdominal pain, unspecified: Secondary | ICD-10-CM | POA: Insufficient documentation

## 2020-08-15 LAB — COMPREHENSIVE METABOLIC PANEL
ALT: 17 U/L (ref 0–44)
AST: 13 U/L — ABNORMAL LOW (ref 15–41)
Albumin: 4.2 g/dL (ref 3.5–5.0)
Alkaline Phosphatase: 66 U/L (ref 38–126)
Anion gap: 9 (ref 5–15)
BUN: 8 mg/dL (ref 6–20)
CO2: 23 mmol/L (ref 22–32)
Calcium: 8.7 mg/dL — ABNORMAL LOW (ref 8.9–10.3)
Chloride: 105 mmol/L (ref 98–111)
Creatinine, Ser: 0.8 mg/dL (ref 0.44–1.00)
GFR, Estimated: >60 mL/min (ref >60)
Glucose, Bld: 95 mg/dL (ref 70–99)
Potassium: 4 mmol/L (ref 3.5–5.1)
Sodium: 137 mmol/L (ref 135–145)
Total Bilirubin: 0.7 mg/dL (ref 0.3–1.2)
Total Protein: 7.1 g/dL (ref 6.5–8.1)

## 2020-08-15 LAB — CBC
HCT: 43.6 % (ref 36.0–46.0)
Hemoglobin: 14.6 g/dL (ref 12.0–15.0)
MCH: 30.2 pg (ref 26.0–34.0)
MCHC: 33.5 g/dL (ref 30.0–36.0)
MCV: 90.1 fL (ref 80.0–100.0)
Platelets: 181 10*3/uL (ref 150–400)
RBC: 4.84 MIL/uL (ref 3.87–5.11)
RDW: 13.8 % (ref 11.5–15.5)
WBC: 11.7 10*3/uL — ABNORMAL HIGH (ref 4.0–10.5)
nRBC: 0 % (ref 0.0–0.2)

## 2020-08-15 LAB — LIPASE, BLOOD: Lipase: 21 U/L (ref 11–51)

## 2020-08-15 NOTE — ED Triage Notes (Signed)
Pt reports low abd pain for 3 days.  Pt states she has ovarian cyst and believes it ruptured 3 days ago.  Pt continues to have abd pain.  No v/d  Pt alert  Speech clear.

## 2020-08-16 ENCOUNTER — Encounter: Payer: Self-pay | Admitting: Family Medicine

## 2020-09-27 ENCOUNTER — Telehealth: Payer: Self-pay | Admitting: Family Medicine

## 2020-09-27 NOTE — Telephone Encounter (Signed)
Patient was established with Yesenia Beasley previously but is having to find a different provider due to Skene leaving. Per patient was recommended by Yesenia Beasley to establish care with Dr. Einar Pheasant. I did inform patient that Dr. Einar Pheasant was not accepting new patients at the moment but she requested that I ask Dr.Cody.   Please advise.

## 2020-09-28 NOTE — Telephone Encounter (Signed)
This would be a transfer of care. At this time all of Yesenia Beasley established patients are able to transfer care to many of the providers in the office - including myself

## 2020-09-28 NOTE — Telephone Encounter (Signed)
Please advise 

## 2020-10-03 NOTE — Telephone Encounter (Signed)
Spoke with patient. Scheduled with Dr Einar Pheasant for first available on 11/14/20 patient was recommended Dr Einar Pheasant by Jackelyn Poling

## 2020-11-14 ENCOUNTER — Other Ambulatory Visit: Payer: Self-pay

## 2020-11-14 ENCOUNTER — Ambulatory Visit (INDEPENDENT_AMBULATORY_CARE_PROVIDER_SITE_OTHER): Payer: 59 | Admitting: Family Medicine

## 2020-11-14 ENCOUNTER — Encounter: Payer: Self-pay | Admitting: Family Medicine

## 2020-11-14 VITALS — BP 128/68 | HR 93 | Temp 96.9°F | Ht 65.0 in | Wt 320.2 lb

## 2020-11-14 DIAGNOSIS — Z8249 Family history of ischemic heart disease and other diseases of the circulatory system: Secondary | ICD-10-CM | POA: Diagnosis not present

## 2020-11-14 DIAGNOSIS — E782 Mixed hyperlipidemia: Secondary | ICD-10-CM | POA: Insufficient documentation

## 2020-11-14 DIAGNOSIS — F410 Panic disorder [episodic paroxysmal anxiety] without agoraphobia: Secondary | ICD-10-CM

## 2020-11-14 DIAGNOSIS — J45909 Unspecified asthma, uncomplicated: Secondary | ICD-10-CM | POA: Insufficient documentation

## 2020-11-14 DIAGNOSIS — J4520 Mild intermittent asthma, uncomplicated: Secondary | ICD-10-CM

## 2020-11-14 DIAGNOSIS — G43809 Other migraine, not intractable, without status migrainosus: Secondary | ICD-10-CM

## 2020-11-14 DIAGNOSIS — G43909 Migraine, unspecified, not intractable, without status migrainosus: Secondary | ICD-10-CM | POA: Insufficient documentation

## 2020-11-14 MED ORDER — ALBUTEROL SULFATE HFA 108 (90 BASE) MCG/ACT IN AERS: INHALATION_SPRAY | RESPIRATORY_TRACT | 2 refills | Status: DC

## 2020-11-14 NOTE — Assessment & Plan Note (Signed)
Currently working to stop psych meds and then planning to focus on diet. Will start regular walking program. Return 6 months for weight check in

## 2020-11-14 NOTE — Patient Instructions (Signed)
Great to meet you!  #Referral I have placed a referral to a specialist for you. You should receive a phone call from the specialty office. Make sure your voicemail is not full and that if you are able to answer your phone to unknown or new numbers.   It may take up to 2 weeks to hear about the referral. If you do not hear anything in 2 weeks, please call our office and ask to speak with the referral coordinator.

## 2020-11-14 NOTE — Assessment & Plan Note (Signed)
Primarily exercise induced. Rare Albuterol

## 2020-11-14 NOTE — Assessment & Plan Note (Addendum)
Follows with psychiatry - Dr. Lyndel Safe. Working to taper all her medications. Cont Effexor 75 mg, sertraline 100 mg, and xanax 0.25 with taper plan

## 2020-11-14 NOTE — Assessment & Plan Note (Signed)
Low HDL not changed with lipitor. LDL improved but fairly young and only 130 when started. Cardiology referral to see if statin is indicated given age but with strong family history.

## 2020-11-14 NOTE — Assessment & Plan Note (Signed)
Follows with Dr. Brigitte Pulse at [place] 75 mg.

## 2020-11-14 NOTE — Progress Notes (Signed)
Subjective:     Yesenia Beasley is a 35 y.o. female presenting for Establish Care (TOC from [place])     HPI   #Panic/anxiety - working with psychiatry - working get off alprazolam - decreasing venlafaxine - taking zoloft - was having severe panic attacks  - has health anxiety - gained weight on effexor  #Family history of early MI - started atorvastatin with improvement in cholesterol  - PGF 47, father also MI young - several aunts/uncles with sudden death  Review of Systems   Social History   Tobacco Use  Smoking Status Current Every Day Smoker  . Packs/day: 0.50  . Years: 10.00  . Pack years: 5.00  . Types: Cigarettes  Smokeless Tobacco Never Used        Objective:    BP Readings from Last 3 Encounters:  11/14/20 128/68  08/15/20 (!) 148/89  08/01/20 108/64   Wt Readings from Last 3 Encounters:  11/14/20 (!) 320 lb 4 oz (145.3 kg)  08/15/20 290 lb (131.5 kg)  06/08/20 (!) 303 lb 8 oz (137.7 kg)    BP 128/68 (BP Location: Left Arm, Patient Position: Sitting, Cuff Size: Large)   Pulse 93   Temp (!) 96.9 F (36.1 C) (Temporal)   Ht 5\' 5"  (1.651 m)   Wt (!) 320 lb 4 oz (145.3 kg)   LMP 11/09/2020   SpO2 96%   BMI 53.29 kg/m    Physical Exam Constitutional:      General: She is not in acute distress.    Appearance: She is well-developed. She is obese. She is not diaphoretic.  HENT:     Right Ear: External ear normal.     Left Ear: External ear normal.  Eyes:     Conjunctiva/sclera: Conjunctivae normal.  Cardiovascular:     Rate and Rhythm: Normal rate and regular rhythm.     Heart sounds: No murmur heard.   Pulmonary:     Effort: Pulmonary effort is normal. No respiratory distress.     Breath sounds: Normal breath sounds. No wheezing.  Musculoskeletal:     Cervical back: Neck supple.  Skin:    General: Skin is warm and dry.     Capillary Refill: Capillary refill takes less than 2 seconds.  Neurological:     Mental Status: She is alert.  Mental status is at baseline.  Psychiatric:        Mood and Affect: Mood normal.        Behavior: Behavior normal.           Assessment & Plan:   Problem List Items Addressed This Visit      Cardiovascular and Mediastinum   Migraine    Follows with Dr. Brigitte Pulse at [facility] 75 mg.       Relevant Medications   atorvastatin (LIPITOR) 10 MG tablet   venlafaxine XR (EFFEXOR-XR) 75 MG 24 hr capsule     Respiratory   Asthma    Primarily exercise induced. Rare Albuterol       Relevant Medications   albuterol (VENTOLIN HFA) 108 (90 Base) MCG/ACT inhaler     Other   Panic disorder    Follows with psychiatry - Dr. Lyndel Safe. Working to taper all her medications. Cont Effexor 75 mg, sertraline 100 mg, and xanax 0.25 with taper plan      Relevant Medications   ALPRAZolam (XANAX) 0.25 MG tablet   venlafaxine XR (EFFEXOR-XR) 75 MG 24 hr capsule   Family history of  heart disease in female family member before age 60 - Primary    Several family members with early MI and sudden death at young age. Pt aware of tobacco and obesity as risk factors. Hx of panic and CP associated. Discussed cardiology consult to see if any additional work-up is indicated for screening. Referral placed.       Relevant Orders   Ambulatory referral to Cardiology   Mixed hyperlipidemia    Low HDL not changed with lipitor. LDL improved but fairly young and only 130 when started. Cardiology referral to see if statin is indicated given age but with strong family history.       Relevant Medications   atorvastatin (LIPITOR) 10 MG tablet   Other Relevant Orders   Ambulatory referral to Cardiology       Return in about 6 months (around 05/17/2021) for check in for weight loss/smoking.  Lesleigh Noe, MD  This visit occurred during the SARS-CoV-2 public health emergency.  Safety protocols were in place, including screening questions prior to the visit, additional usage of staff PPE, and extensive  cleaning of exam room while observing appropriate contact time as indicated for disinfecting solutions.

## 2020-11-14 NOTE — Assessment & Plan Note (Signed)
Several family members with early MI and sudden death at young age. Pt aware of tobacco and obesity as risk factors. Hx of panic and CP associated. Discussed cardiology consult to see if any additional work-up is indicated for screening. Referral placed.

## 2020-12-17 ENCOUNTER — Other Ambulatory Visit: Payer: Self-pay | Admitting: Family Medicine

## 2020-12-17 DIAGNOSIS — J4520 Mild intermittent asthma, uncomplicated: Secondary | ICD-10-CM

## 2020-12-19 ENCOUNTER — Other Ambulatory Visit: Payer: Self-pay | Admitting: Family Medicine

## 2020-12-19 DIAGNOSIS — J4520 Mild intermittent asthma, uncomplicated: Secondary | ICD-10-CM

## 2020-12-19 MED ORDER — LEVALBUTEROL TARTRATE 45 MCG/ACT IN AERO: 1.0000 | INHALATION_SPRAY | RESPIRATORY_TRACT | 12 refills | Status: DC | PRN

## 2020-12-19 NOTE — Progress Notes (Signed)
Notified that albuterol not covered  Sending alternative

## 2021-01-19 ENCOUNTER — Other Ambulatory Visit: Payer: Self-pay

## 2021-01-19 ENCOUNTER — Emergency Department
Admission: EM | Admit: 2021-01-19 | Discharge: 2021-01-19 | Disposition: A | Discharge status: Home / Self Care | Payer: 59 | Attending: Emergency Medicine | Admitting: Emergency Medicine

## 2021-01-19 ENCOUNTER — Emergency Department: Payer: 59

## 2021-01-19 ENCOUNTER — Encounter: Payer: Self-pay | Admitting: Emergency Medicine

## 2021-01-19 DIAGNOSIS — J45909 Unspecified asthma, uncomplicated: Secondary | ICD-10-CM | POA: Insufficient documentation

## 2021-01-19 DIAGNOSIS — Z86011 Personal history of benign neoplasm of the brain: Secondary | ICD-10-CM | POA: Insufficient documentation

## 2021-01-19 DIAGNOSIS — N83202 Unspecified ovarian cyst, left side: Secondary | ICD-10-CM | POA: Diagnosis not present

## 2021-01-19 DIAGNOSIS — U071 COVID-19: Secondary | ICD-10-CM | POA: Diagnosis not present

## 2021-01-19 DIAGNOSIS — F17210 Nicotine dependence, cigarettes, uncomplicated: Secondary | ICD-10-CM | POA: Diagnosis not present

## 2021-01-19 DIAGNOSIS — K219 Gastro-esophageal reflux disease without esophagitis: Secondary | ICD-10-CM | POA: Diagnosis not present

## 2021-01-19 DIAGNOSIS — R1032 Left lower quadrant pain: Secondary | ICD-10-CM

## 2021-01-19 LAB — URINALYSIS, COMPLETE (UACMP) WITH MICROSCOPIC
Bilirubin Urine: NEGATIVE
Glucose, UA: NEGATIVE mg/dL
Ketones, ur: NEGATIVE mg/dL
Leukocytes,Ua: NEGATIVE
Nitrite: NEGATIVE
Protein, ur: NEGATIVE mg/dL
Specific Gravity, Urine: 1.02 (ref 1.005–1.030)
pH: 6 (ref 5.0–8.0)

## 2021-01-19 LAB — CBC
HCT: 43.9 % (ref 36.0–46.0)
Hemoglobin: 14.9 g/dL (ref 12.0–15.0)
MCH: 29.7 pg (ref 26.0–34.0)
MCHC: 33.9 g/dL (ref 30.0–36.0)
MCV: 87.5 fL (ref 80.0–100.0)
Platelets: 175 10*3/uL (ref 150–400)
RBC: 5.02 MIL/uL (ref 3.87–5.11)
RDW: 13.2 % (ref 11.5–15.5)
WBC: 9.6 10*3/uL (ref 4.0–10.5)
nRBC: 0 % (ref 0.0–0.2)

## 2021-01-19 LAB — COMPREHENSIVE METABOLIC PANEL
ALT: 22 U/L (ref 0–44)
AST: 15 U/L (ref 15–41)
Albumin: 4.1 g/dL (ref 3.5–5.0)
Alkaline Phosphatase: 66 U/L (ref 38–126)
Anion gap: 7 (ref 5–15)
BUN: 11 mg/dL (ref 6–20)
CO2: 20 mmol/L — ABNORMAL LOW (ref 22–32)
Calcium: 8.6 mg/dL — ABNORMAL LOW (ref 8.9–10.3)
Chloride: 105 mmol/L (ref 98–111)
Creatinine, Ser: 0.59 mg/dL (ref 0.44–1.00)
GFR, Estimated: >60 mL/min (ref >60)
Glucose, Bld: 109 mg/dL — ABNORMAL HIGH (ref 70–99)
Potassium: 3.8 mmol/L (ref 3.5–5.1)
Sodium: 132 mmol/L — ABNORMAL LOW (ref 135–145)
Total Bilirubin: 0.6 mg/dL (ref 0.3–1.2)
Total Protein: 6.9 g/dL (ref 6.5–8.1)

## 2021-01-19 LAB — LIPASE, BLOOD: Lipase: 29 U/L (ref 11–51)

## 2021-01-19 LAB — POC URINE PREG, ED: Preg Test, Ur: NEGATIVE

## 2021-01-19 IMAGING — US US PELVIS COMPLETE TRANSABD/TRANSVAG W DUPLEX
1 series · 13 of 25 positions shown · non-contrast
Comparison: [DATE]

CLINICAL DATA: Left lower quadrant pain for 2 days

EXAM:
TRANSABDOMINAL AND TRANSVAGINAL ULTRASOUND OF PELVIS
DOPPLER ULTRASOUND OF OVARIES
TECHNIQUE: Both transabdominal and transvaginal ultrasound examinations of the
pelvis were performed. Transabdominal technique was performed for
global imaging of the pelvis including uterus, ovaries, adnexal
regions, and pelvic cul-de-sac.
It was necessary to proceed with endovaginal exam following the
transabdominal exam to visualize the uterus, endometrium and
ovaries. Color and duplex Doppler ultrasound was utilized to
evaluate blood flow to the ovaries.

[Series 1: us pelvis (transabdominal only) · 13 of 43 slices shown]
[im 1/43]
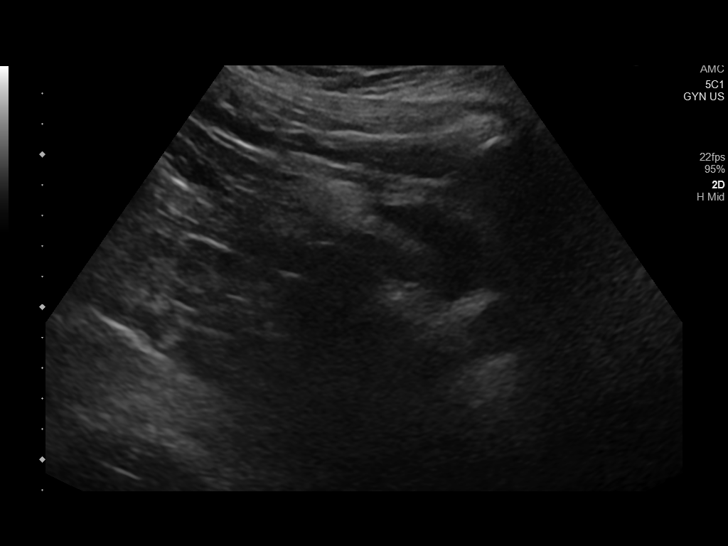
[im 4/43]
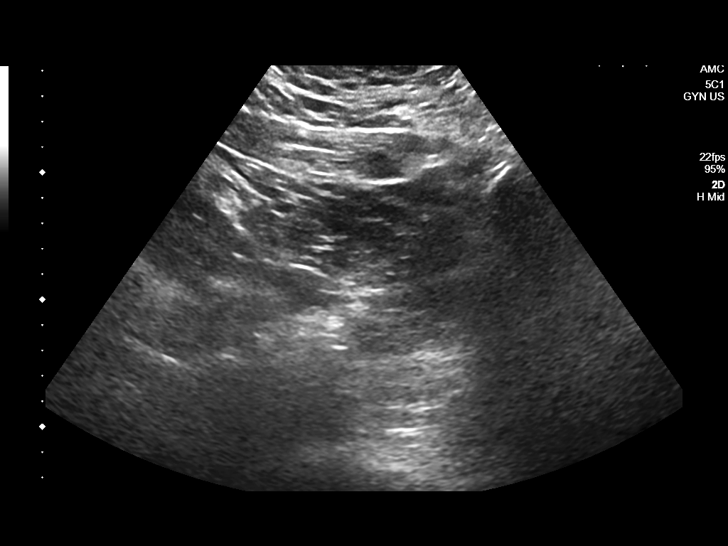
[im 8/43]
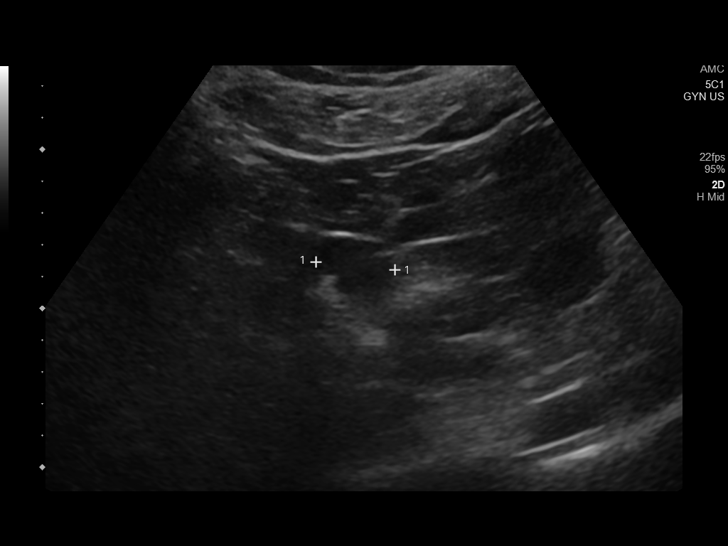
[im 11/43]
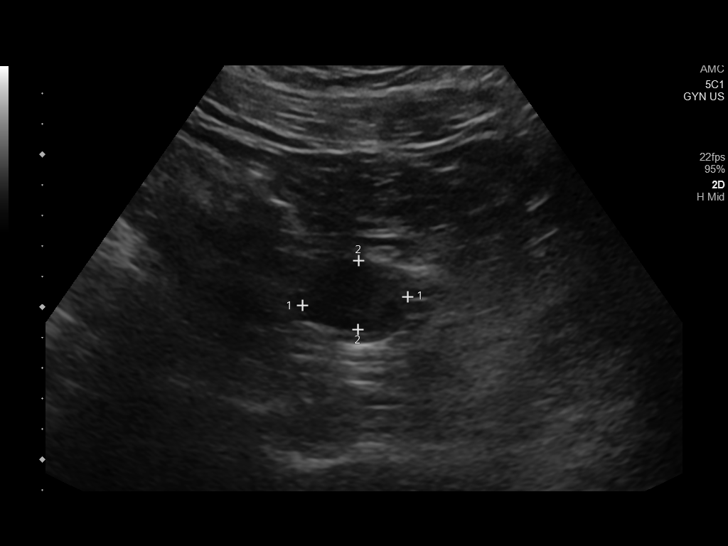
[im 15/43]
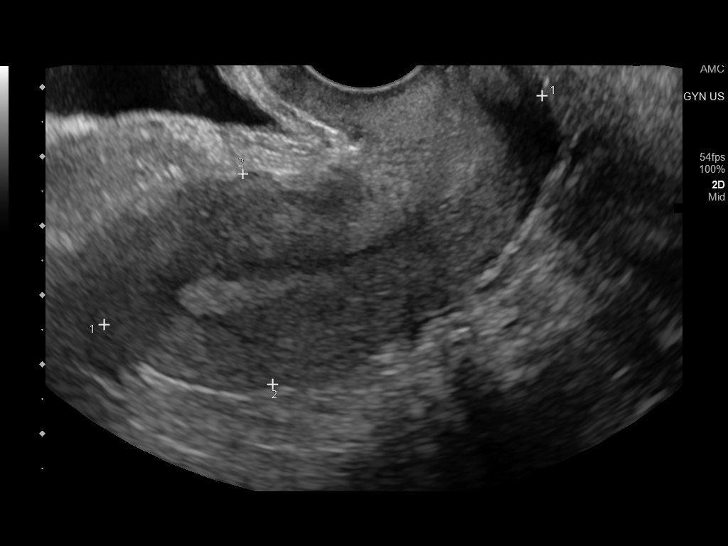
[im 18/43]
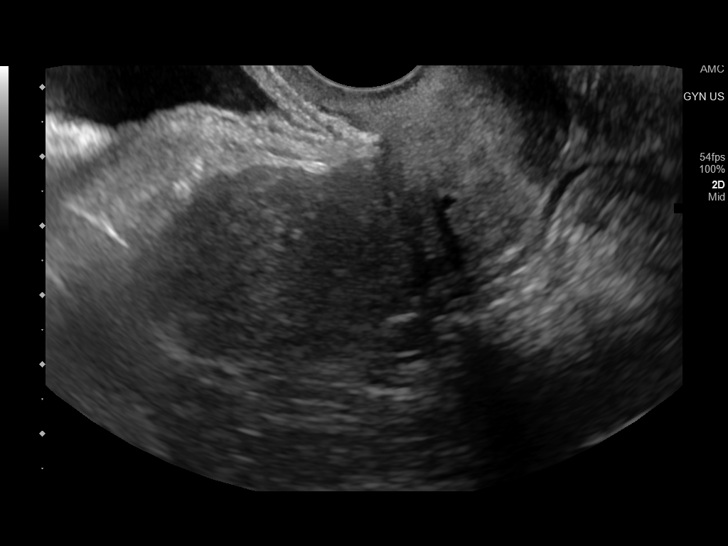
[im 22/43]
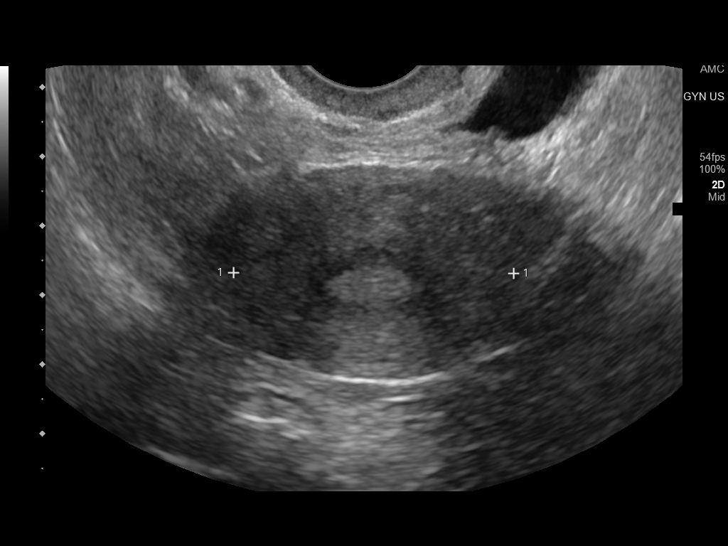
[im 25/43]
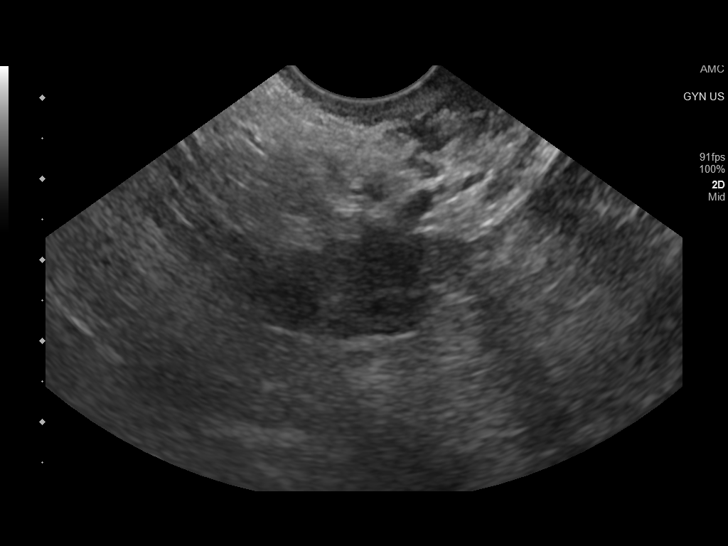
[im 29/43]
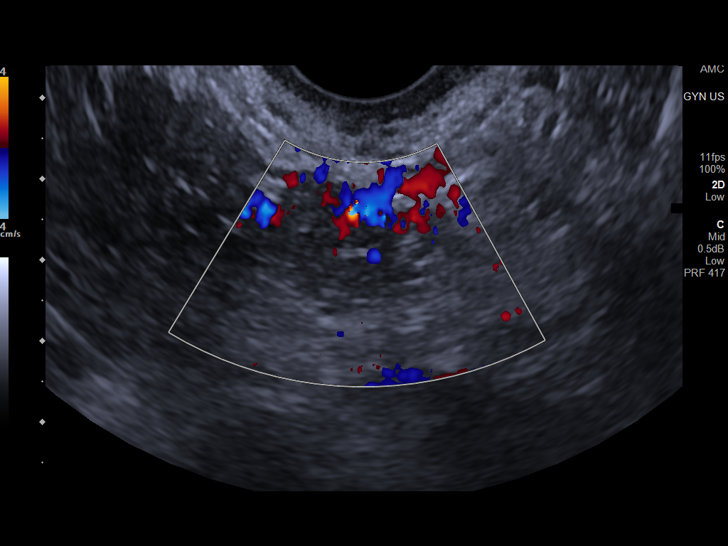
[im 32/43]
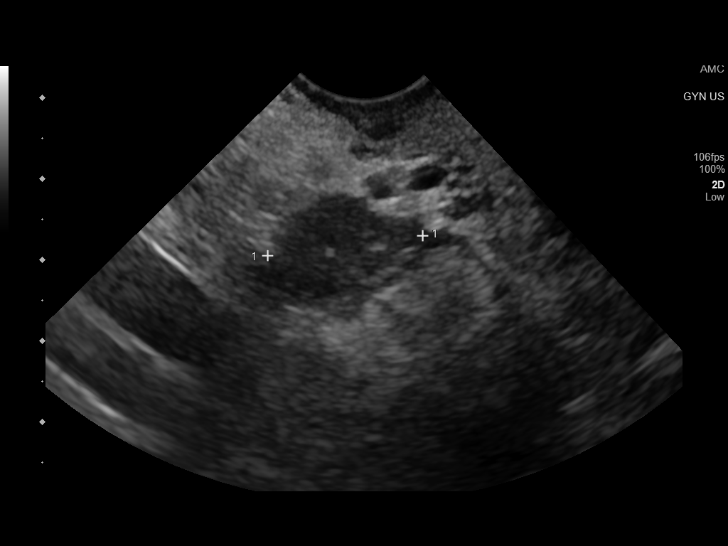
[im 36/43]
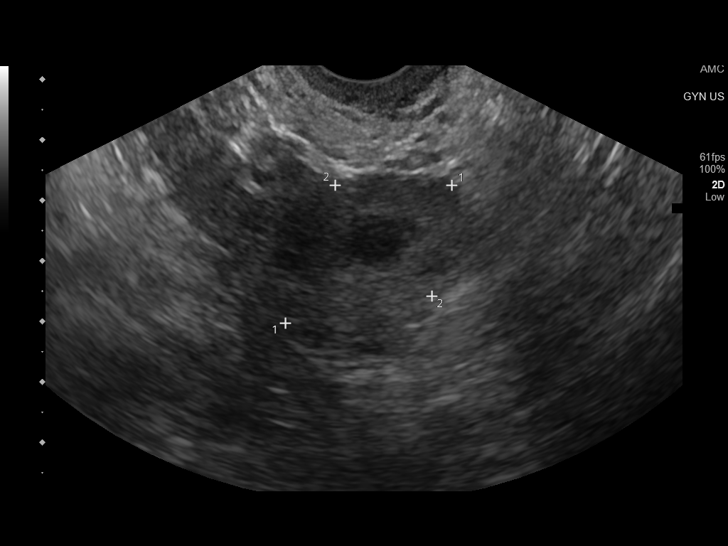
[im 39/43]
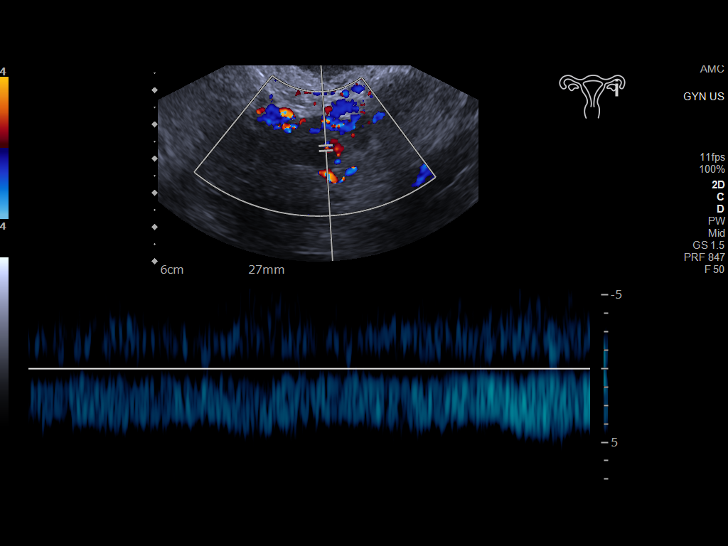
[im 43/43]
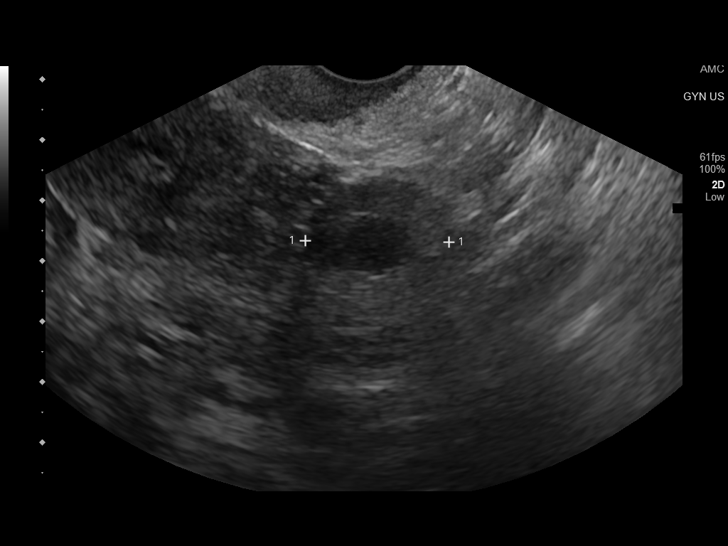

[13 of 25 positions shown; findings below may reference images not displayed]

FINDINGS: Uterus

Measurements: 7.1 x 3.1 x 4.0 cm = volume: 46 mL. No fibroids or
other mass visualized.

Endometrium

Thickness: 5.7 mm.  No focal abnormality visualized.

Right ovary

Measurements: 2.5 x 1.3 x 1.9 cm = volume: 3.3 mL. Normal
appearance/no adnexal mass.

Left ovary

Measurements: 3.6 x 2.4 x 2.4 cm = volume: 10.0 mL. Contains a small
partially collapsed cyst or corpus luteum.

Pulsed Doppler evaluation of both ovaries demonstrates normal
low-resistance arterial and venous waveforms.

Other findings

No abnormal free fluid.
IMPRESSION: 1. No acute findings.  No evidence for ovarian mass or torsion.
2. Physiologic cyst noted within the left ovary.

## 2021-01-19 MED ORDER — ONDANSETRON 4 MG PO TBDP
4.0000 mg | ORAL_TABLET | Freq: Once | ORAL | Status: AC
  Administered 2021-01-19: 4 mg via ORAL
  Filled 2021-01-19: qty 1

## 2021-01-19 NOTE — ED Triage Notes (Signed)
Pt states that she is having LLQ pain, she thinks that it could be an ovarian cyst. She reports being COVID positive a week ago. She is also reporting that she has had diarrhea.

## 2021-01-19 NOTE — ED Provider Notes (Signed)
[facility] Emergency Department Provider Note  ____________________________________________   Event Date/Time   First MD Initiated Contact with Patient 01/19/21 [phone]     (approximate)  I have reviewed the triage vital signs and the nursing notes.   HISTORY  Chief Complaint Abdominal Pain    HPI Yesenia Beasley is a 35 y.o. female with history of ovarian cyst, pseudotumor cerebri who presents to the emergency department left lower quadrant sharp abdominal pain that she describes as moderate to severe in nature without radiation that she is worried is a ruptured ovarian cyst.  Denies any vaginal bleeding, discharge.  Has been taking Tylenol and ibuprofen at home with relief but states she became concerned that she may be taking too much ibuprofen.  She was diagnosed with COVID-19 about a week ago and has had symptoms for 8 days.  Reports fevers that have resolved, cough, diarrhea.  No chest pain or shortness of breath.  Has had some nausea today but no vomiting.  No dysuria or hematuria.  Sexually active with 1 female partner.  Denies concerns for sexually transmitted diseases.        Past Medical History:  Diagnosis Date  . Anxiety   . Genetic testing 11/09/2017   Multi-Cancer panel (83 genes) @ [facility] - No pathogenic mutations detected  . Headache disorder   . History of ovarian cyst   . Kidney stone   . Migraine   . Pseudotumor cerebri   . Urinary tract infection     Patient Active Problem List   Diagnosis Date Noted  . Asthma 11/14/2020  . Migraine 11/14/2020  . Family history of heart disease in female family member before age 79 11/14/2020  . Mixed hyperlipidemia 11/14/2020  . Elevated LDL cholesterol level 07/23/2019  . Hemorrhagic ovarian cyst 04/27/2019  . Morbid obesity ([place]) 09/25/2018  . Gastroesophageal reflux disease 01/06/2018  . Genetic testing 11/09/2017  . Dysmenorrhea 06/25/2017  . Panic disorder 06/25/2017  . Pseudotumor cerebri  06/25/2017  . History of colon polyps 06/25/2017  . Depression, recurrent ([place]) 06/25/2017  . Anxiety 06/15/2017    Past Surgical History:  Procedure Laterality Date  . COLONOSCOPY WITH ESOPHAGOGASTRODUODENOSCOPY (EGD)    . COLONOSCOPY WITH PROPOFOL N/A 10/05/2017   Procedure: COLONOSCOPY WITH PROPOFOL;  Surgeon: Jonathon Bellows, MD;  Location[facility] ENDOSCOPY;  Service: Gastroenterology;  Laterality: N/A;  . KNEE SURGERY Bilateral 2002,2003,2014  . TONSILLECTOMY      Prior to Admission medications   Medication Sig Start Date End Date Taking? Authorizing Provider  ALPRAZolam (XANAX) 0.25 MG tablet Take 0.25 mg by mouth daily. 10/24/20   [provider]  atorvastatin (LIPITOR) 10 MG tablet Take 10 mg by mouth daily.    [provider]  cholecalciferol (VITAMIN D3) 25 MCG (1000 UNIT) tablet Take 1,000 Units by mouth daily.    [provider]  levalbuterol Penne Lash HFA) 45 MCG/ACT inhaler Inhale 1-2 puffs into the lungs every 4 (four) hours as needed for wheezing. 12/19/20   Lesleigh Noe, MD  ondansetron (ZOFRAN ODT) 4 MG disintegrating tablet Take 1 tablet (4 mg total) by mouth every 8 (eight) hours as needed. 09/18/19   Lavonia Drafts, MD  Rimegepant Sulfate 75 MG TBDP Take 1 tablet by mouth every other day. 11/07/19   [provider]  sertraline (ZOLOFT) 100 MG tablet TAKE 2 TABLETS BY MOUTH EVERY DAY 05/04/20   Elby Beck, FNP  venlafaxine XR (EFFEXOR-XR) 75 MG 24 hr capsule Take 75 mg  by mouth daily. 10/23/20   [provider]    Allergies Patient has no known allergies.  Family History  Problem Relation Age of Onset  . Leukemia Mother        CMML; dx 42s; deceased 25  . Heart disease Father   . Colon polyps Father        initially in 71s; #/type unknown  . Panic disorder Father   . Heart attack Father   . Wilm's tumor Sister        deceased at 2  . Brain cancer Maternal Grandfather        astrocytoma; deceased 50  . Heart attack  Paternal Grandfather 26       deceased at 40  . Thyroid cancer Maternal Uncle        dx 81s  . Non-Hodgkin's lymphoma Maternal Aunt   . Panic disorder Sister     Social History Social History   Tobacco Use  . Smoking status: Current Every Day Smoker    Packs/day: 0.50    Years: 10.00    Pack years: 5.00    Types: Cigarettes  . Smokeless tobacco: Never Used  Vaping Use  . Vaping Use: Never used  Substance Use Topics  . Alcohol use: Never  . Drug use: No    Review of Systems Constitutional: No fever. Eyes: No visual changes. ENT: No sore throat. Cardiovascular: Denies chest pain. Respiratory: Denies shortness of breath. Gastrointestinal:+ Nausea and diarrhea. Genitourinary: Negative for dysuria. Musculoskeletal: Negative for back pain. Skin: Negative for rash. Neurological: Negative for focal weakness or numbness.  ____________________________________________   PHYSICAL EXAM:  VITAL SIGNS: ED Triage Vitals  Enc Vitals Group     BP 01/19/21 0347 (!) 141/106     Pulse Rate 01/19/21 0346 93     Resp 01/19/21 0346 18     Temp 01/19/21 0346 98.2 F (36.8 C)     Temp Source 01/19/21 0346 Oral     SpO2 --      Weight 01/19/21 0347 300 lb (136.1 kg)     Height 01/19/21 0347 5\' 5"  (1.651 m)     Head Circumference --      Peak Flow --      Pain Score 01/19/21 0346 7     Pain Loc --      Pain Edu? --      Excl. in [place]? --    CONSTITUTIONAL: Alert and oriented and responds appropriately to questions. Well-appearing; well-nourished HEAD: Normocephalic EYES: Conjunctivae clear, pupils appear equal, EOM appear intact ENT: normal nose; moist mucous membranes NECK: Supple, normal ROM CARD: RRR; S1 and S2 appreciated; no murmurs, no clicks, no rubs, no gallops RESP: Normal chest excursion without splinting or tachypnea; breath sounds clear and equal bilaterally; no wheezes, no rhonchi, no rales, no hypoxia or respiratory distress, speaking full sentences ABD/GI: Normal  bowel sounds; non-distended; soft, non-tender, no rebound, no guarding, no peritoneal signs, no hepatosplenomegaly GU: patient defers BACK: The back appears normal EXT: Normal ROM in all joints; no deformity noted, no edema; no cyanosis SKIN: Normal color for age and race; warm; no rash on exposed skin NEURO: Moves all extremities equally PSYCH: The patient's mood and manner are appropriate.  ____________________________________________   LABS (all labs ordered are listed, but only abnormal results are displayed)  Labs Reviewed  COMPREHENSIVE METABOLIC PANEL - Abnormal; Notable for the following components:      Result Value   Sodium 132 (*)    CO2  20 (*)    Glucose, Bld 109 (*)    Calcium 8.6 (*)    All other components within normal limits  URINALYSIS, COMPLETE (UACMP) WITH MICROSCOPIC - Abnormal; Notable for the following components:   Color, Urine YELLOW (*)    APPearance HAZY (*)    Hgb urine dipstick SMALL (*)    Bacteria, UA RARE (*)    All other components within normal limits  LIPASE, BLOOD  CBC  POC URINE PREG, ED   ____________________________________________  EKG   ____________________________________________  RADIOLOGY I, Maybree Riling, personally viewed and evaluated these images (plain radiographs) as part of my medical decision making, as well as reviewing the written report by the radiologist.  ED MD interpretation: Left ovarian cyst.  Official radiology report(s): US PELVIC COMPLETE W TRANSVAGINAL AND TORSION R/O  Result Date: 01/19/2021 CLINICAL DATA:  Left lower quadrant pain for 2 days EXAM: TRANSABDOMINAL AND TRANSVAGINAL ULTRASOUND OF PELVIS DOPPLER ULTRASOUND OF OVARIES TECHNIQUE: Both transabdominal and transvaginal ultrasound examinations of the pelvis were performed. Transabdominal technique was performed for global imaging of the pelvis including uterus, ovaries, adnexal regions, and pelvic cul-de-sac. It was necessary to proceed with  endovaginal exam following the transabdominal exam to visualize the uterus, endometrium and ovaries. Color and duplex Doppler ultrasound was utilized to evaluate blood flow to the ovaries. COMPARISON:  06/21/2018 FINDINGS: Uterus Measurements: 7.1 x 3.1 x 4.0 cm = volume: 46 mL. No fibroids or other mass visualized. Endometrium Thickness: 5.7 mm.  No focal abnormality visualized. Right ovary Measurements: 2.5 x 1.3 x 1.9 cm = volume: 3.3 mL. Normal appearance/no adnexal mass. Left ovary Measurements: 3.6 x 2.4 x 2.4 cm = volume: 10.0 mL. Contains a small partially collapsed cyst or corpus luteum. Pulsed Doppler evaluation of both ovaries demonstrates normal low-resistance arterial and venous waveforms. Other findings No abnormal free fluid. IMPRESSION: 1. No acute findings.  No evidence for ovarian mass or torsion. 2. Physiologic cyst noted within the left ovary. Electronically Signed   By: Kerby Moors M.D.   On: 01/19/2021 05:55    ____________________________________________   PROCEDURES  Procedure(s) performed (including Critical Care):  Procedures   ____________________________________________   INITIAL IMPRESSION / ASSESSMENT AND PLAN / ED COURSE  As part of my medical decision making, I reviewed the following data within the [facility] notes reviewed and incorporated, Labs reviewed , Old chart reviewed, Radiograph reviewed  and Notes from prior ED visits         Patient here with complaints of left lower quadrant abdominal pain.  Abdominal exam today here is benign.  Labs reassuring.  No leukocytosis.  Normal LFTs, lipase.  Urine does not appear infected.  Pregnancy test is negative.  She is concerned this could be another ruptured ovarian cyst.  Differential also includes torsion, PID, TOA.  She declines pelvic exam and STD screening today.  Will obtain transvaginal ultrasound.  She declines pain medicine at this time but does agree to Zofran.  Low suspicion  for diverticulitis, colitis, appendicitis, bowel obstruction, perforation based on her exam.  ED PROGRESS  Transvaginal ultrasound she has a physiologic cyst in the left ovary.  No torsion.  She reports feeling better.  Tolerating p.o. here.  Recommended Tylenol, Motrin at home.  She is comfortable with this plan.  Discussed return precautions.  At this time, I do not feel there is any life-threatening condition present. I have reviewed, interpreted and discussed all results (EKG, imaging, lab, urine as appropriate) and  exam findings with patient/family. I have reviewed nursing notes and appropriate previous records.  I feel the patient is safe to be discharged home without further emergent workup and can continue workup as an outpatient as needed. Discussed usual and customary return precautions. Patient/family verbalize understanding and are comfortable with this plan.  Outpatient follow-up has been provided as needed. All questions have been answered.  ____________________________________________   FINAL CLINICAL IMPRESSION(S) / ED DIAGNOSES  Final diagnoses:  Left ovarian cyst     ED Discharge Orders    None      *Please note:  Navpreet Szczygiel was evaluated in Emergency Department on 01/19/2021 for the symptoms described in the history of present illness. She was evaluated in the context of the global COVID-19 pandemic, which necessitated consideration that the patient might be at risk for infection with the SARS-CoV-2 virus that causes COVID-19. Institutional protocols and algorithms that pertain to the evaluation of patients at risk for COVID-19 are in a state of rapid change based on information released by regulatory bodies including the CDC and federal and state organizations. These policies and algorithms were followed during the patient's care in the ED.  Some ED evaluations and interventions may be delayed as a result of limited staffing during and the pandemic.*   Note:  This document  was prepared using Dragon voice recognition software and may include unintentional dictation errors.   Neal Trulson, Delice Bison, DO 01/19/21 [phone]

## 2021-01-19 NOTE — Discharge Instructions (Addendum)
You may alternate Tylenol 1000 mg every 6 hours as needed for pain, fever and Ibuprofen 800 mg every 8 hours as needed for pain, fever.  Please take Ibuprofen with food.  Do not take more than 4000 mg of Tylenol (acetaminophen) in a 24 hour period.  

## 2021-01-26 ENCOUNTER — Ambulatory Visit: Payer: Self-pay

## 2021-01-30 ENCOUNTER — Other Ambulatory Visit: Payer: Self-pay

## 2021-01-30 ENCOUNTER — Telehealth (INDEPENDENT_AMBULATORY_CARE_PROVIDER_SITE_OTHER): Payer: 59 | Admitting: Family Medicine

## 2021-01-30 ENCOUNTER — Encounter: Payer: Self-pay | Admitting: Family Medicine

## 2021-01-30 VITALS — HR 80 | Wt 290.0 lb

## 2021-01-30 DIAGNOSIS — Z9189 Other specified personal risk factors, not elsewhere classified: Secondary | ICD-10-CM | POA: Diagnosis not present

## 2021-01-30 DIAGNOSIS — J0110 Acute frontal sinusitis, unspecified: Secondary | ICD-10-CM

## 2021-01-30 DIAGNOSIS — J069 Acute upper respiratory infection, unspecified: Secondary | ICD-10-CM

## 2021-01-30 MED ORDER — DOXYCYCLINE HYCLATE 100 MG PO TABS: 100.0000 mg | ORAL_TABLET | Freq: Two times a day (BID) | ORAL | 0 refills | Status: DC

## 2021-01-30 MED ORDER — GUAIFENESIN-CODEINE 100-10 MG/5ML PO SOLN: 5.0000 mL | Freq: Three times a day (TID) | ORAL | 0 refills | Status: DC | PRN

## 2021-01-30 MED ORDER — AMOXICILLIN-POT CLAVULANATE 875-125 MG PO TABS: 1.0000 | ORAL_TABLET | Freq: Two times a day (BID) | ORAL | 0 refills | Status: DC

## 2021-01-30 NOTE — Patient Instructions (Signed)
Doxycycline - take 10 days for tick illness - call if new symptoms or rashes  Take for sinus symptoms   If not improvement in 2-3 days - would recommend starting Augmentin.

## 2021-01-30 NOTE — Progress Notes (Signed)
I connected with Yesenia Beasley on 01/30/21 at  9:00 AM EDT by video and verified that I am speaking with the correct person using two identifiers.   I discussed the limitations, risks, security and privacy concerns of performing an evaluation and management service by video and the availability of in person appointments. I also discussed with the patient that there may be a patient responsible charge related to this service. The patient expressed understanding and agreed to proceed.  Patient location: Home Provider Location: [facility] Participants: Lesleigh Noe and Yesenia Beasley   Subjective:     Yesenia Beasley is a 35 y.o. female presenting for Covid Positive (May 5th but still sick ), Nasal Congestion, Pressure Behind the Eyes (L eye, severe pain ), and Cough     Cough Pertinent negatives include no chills, fever or shortness of breath.  Sinusitis This is a new problem. Episode onset: 01/10/2021. There has been no fever. The pain is moderate. Associated symptoms include congestion, coughing and sinus pressure. Pertinent negatives include no chills or shortness of breath. Past treatments include spray decongestants, saline sprays, oral decongestants and acetaminophen. The treatment provided no relief.    O2 has been >97% Has some fatigue SOB but nothing serious Endorses some wheezing and chest congestion Dry cough  Not sleeping well - with congestion and cough  Also found an embedded tick in her arm during her covid symptoms   Review of Systems  Constitutional: Negative for chills and fever.  HENT: Positive for congestion, sinus pressure and sinus pain (over the left eye).   Respiratory: Positive for cough. Negative for shortness of breath.   Gastrointestinal: Negative for diarrhea, nausea and vomiting.     Social History   Tobacco Use  Smoking Status Current Every Day Smoker  . Packs/day: 0.50  . Years: 10.00  . Pack years: 5.00  . Types: Cigarettes  Smokeless  Tobacco Never Used        Objective:   BP Readings from Last 3 Encounters:  01/19/21 (!) 141/106  11/14/20 128/68  08/15/20 (!) 148/89   Wt Readings from Last 3 Encounters:  01/30/21 290 lb (131.5 kg)  01/19/21 300 lb (136.1 kg)  11/14/20 (!) 320 lb 4 oz (145.3 kg)    Pulse 80   Wt 290 lb (131.5 kg)   SpO2 97%   BMI 48.26 kg/m    Physical Exam Constitutional:      Appearance: Normal appearance. She is not ill-appearing.  HENT:     Head: Normocephalic and atraumatic.     Right Ear: External ear normal.     Left Ear: External ear normal.  Eyes:     Conjunctiva/sclera: Conjunctivae normal.  Pulmonary:     Effort: Pulmonary effort is normal. No respiratory distress.  Neurological:     Mental Status: She is alert. Mental status is at baseline.  Psychiatric:        Mood and Affect: Mood normal.        Behavior: Behavior normal.        Thought Content: Thought content normal.        Judgment: Judgment normal.            Assessment & Plan:   Problem List Items Addressed This Visit   None   Visit Diagnoses    Acute non-recurrent frontal sinusitis    -  Primary   Relevant Medications   guaiFENesin-codeine 100-10 MG/5ML syrup   doxycycline (VIBRA-TABS) 100 MG tablet  Viral URI with cough       Relevant Medications   guaiFENesin-codeine 100-10 MG/5ML syrup   doxycycline (VIBRA-TABS) 100 MG tablet   At high risk for tick borne illness         Discussed using doxycycline to treat sinus infection given tick exposure of unknown length. Overall seems low risk - no rashes or new fevers - but will treat with 10 days of doxy to cover both.   She will call if new fever/rashes as will need testing/longer course or RMSF  She will return if sinus symptoms not improving.    Return if symptoms worsen or fail to improve.  Lesleigh Noe, MD

## 2021-01-31 ENCOUNTER — Encounter: Payer: Self-pay | Admitting: Family Medicine

## 2021-01-31 DIAGNOSIS — W57XXXD Bitten or stung by nonvenomous insect and other nonvenomous arthropods, subsequent encounter: Secondary | ICD-10-CM

## 2021-01-31 DIAGNOSIS — R52 Pain, unspecified: Secondary | ICD-10-CM

## 2021-01-31 MED ORDER — AMOXICILLIN-POT CLAVULANATE 875-125 MG PO TABS: 1.0000 | ORAL_TABLET | Freq: Two times a day (BID) | ORAL | 0 refills | Status: AC

## 2021-02-13 NOTE — Addendum Note (Signed)
Addended by: Lesleigh Noe on: 02/13/2021 02:12 PM   Modules accepted: Orders

## 2021-02-15 ENCOUNTER — Other Ambulatory Visit: Payer: 59

## 2021-02-18 ENCOUNTER — Other Ambulatory Visit (INDEPENDENT_AMBULATORY_CARE_PROVIDER_SITE_OTHER): Payer: 59

## 2021-02-18 ENCOUNTER — Other Ambulatory Visit: Payer: Self-pay

## 2021-02-18 DIAGNOSIS — M791 Myalgia, unspecified site: Secondary | ICD-10-CM

## 2021-02-18 DIAGNOSIS — W57XXXD Bitten or stung by nonvenomous insect and other nonvenomous arthropods, subsequent encounter: Secondary | ICD-10-CM

## 2021-02-18 DIAGNOSIS — R52 Pain, unspecified: Secondary | ICD-10-CM

## 2021-02-20 LAB — B. BURGDORFI ANTIBODIES BY WB

## 2021-02-20 LAB — ROCKY MTN SPOTTED FVR ABS PNL(IGG+IGM)
RMSF IgG: NOT DETECTED
RMSF IgM: NOT DETECTED

## 2021-04-25 ENCOUNTER — Other Ambulatory Visit: Payer: Self-pay | Admitting: Neurology

## 2021-04-25 DIAGNOSIS — G932 Benign intracranial hypertension: Secondary | ICD-10-CM

## 2021-05-03 ENCOUNTER — Other Ambulatory Visit: Payer: Self-pay

## 2021-05-03 ENCOUNTER — Ambulatory Visit
Admission: RE | Admit: 2021-05-03 | Discharge: 2021-05-03 | Disposition: A | Discharge status: Home / Self Care | Payer: 59 | Source: Ambulatory Visit | Attending: Neurology | Admitting: Neurology

## 2021-05-03 DIAGNOSIS — G932 Benign intracranial hypertension: Secondary | ICD-10-CM | POA: Diagnosis not present

## 2021-05-03 IMAGING — MR MR MRA HEAD W/O CM
1 series · 19 of 48 positions shown · non-contrast
Comparison: Prior MRV from [DATE].

CLINICAL DATA: Follow-up examination for idiopathic intracranial
hypertension. Retro-orbital pain.

EXAM:
MRA HEAD WITHOUT CONTRAST
TECHNIQUE: Angiographic images of the Circle of Willis were acquired using MRA
technique without intravenous contrast.

[Series 5: TOF · axial · 0.5mm · 0.41mm/px · z∈[-77,+18]mm · 19 of 205 slices shown]
[im 1/205]
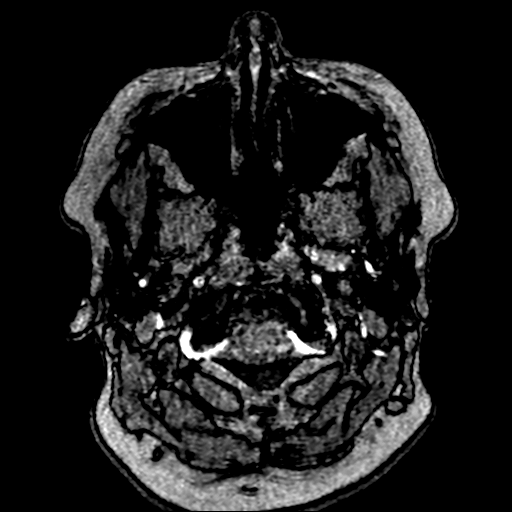
[im 5/205]
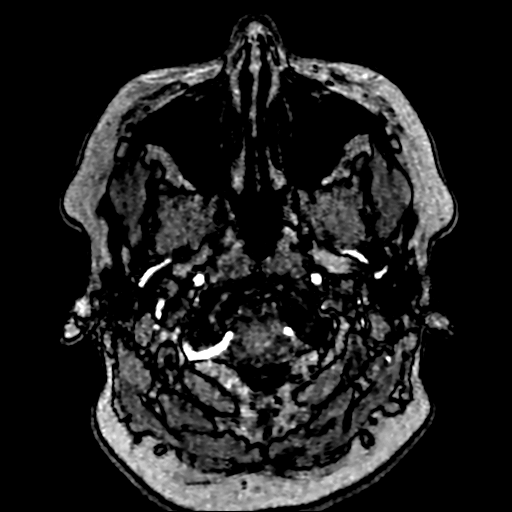
[im 9/205]
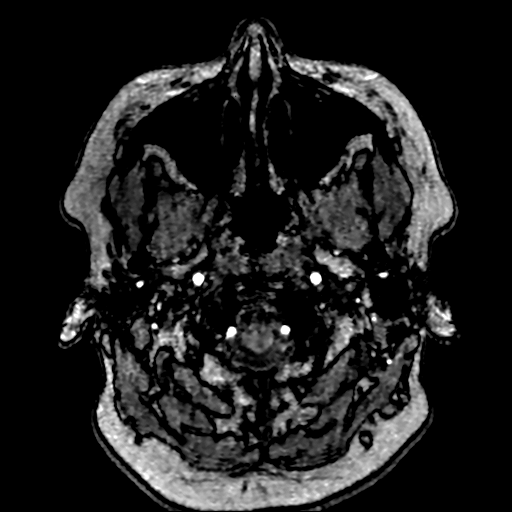
[im 14/205]
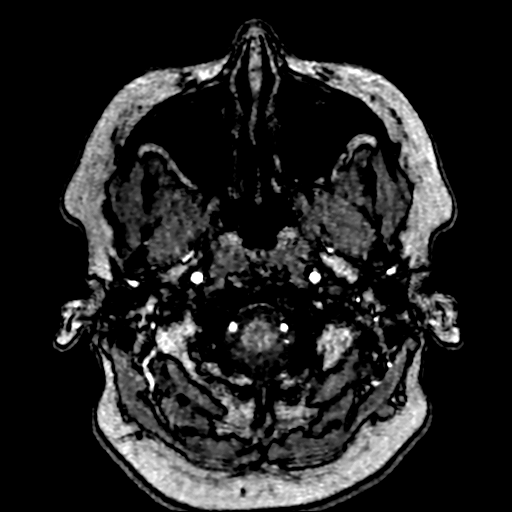
[im 18/205]
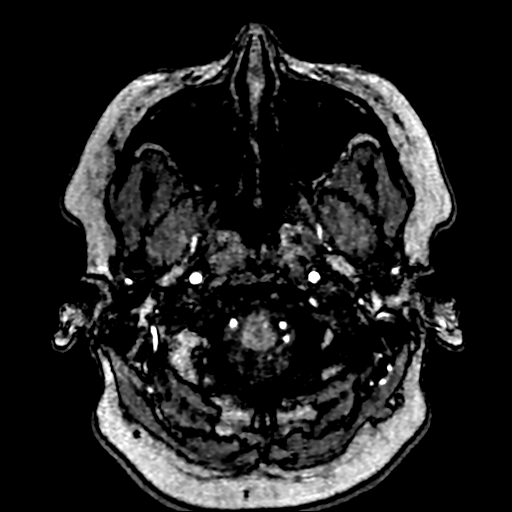
[im 22/205]
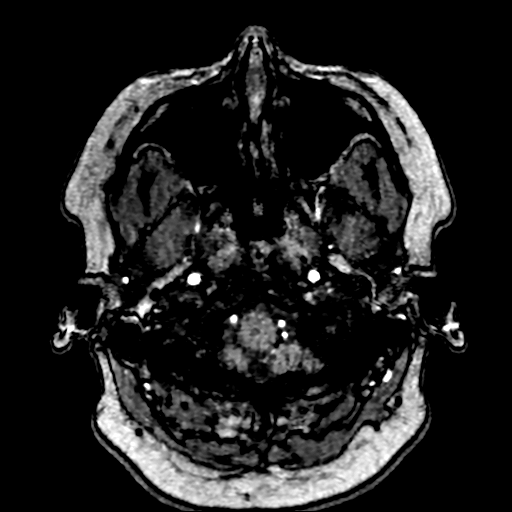
[im 27/205]
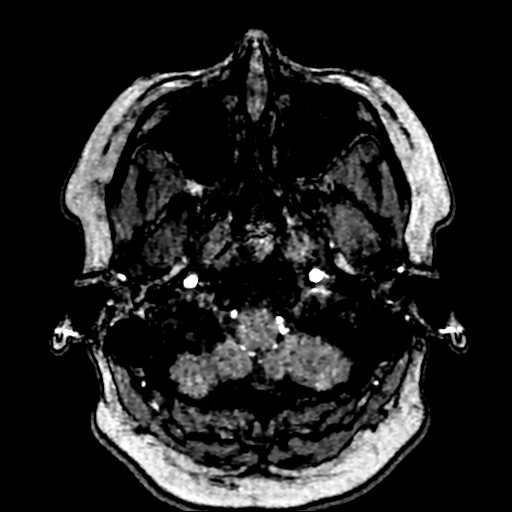
[im 31/205]
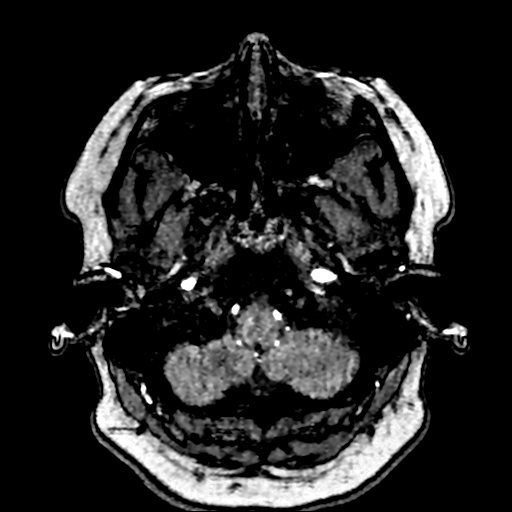
[im 35/205]
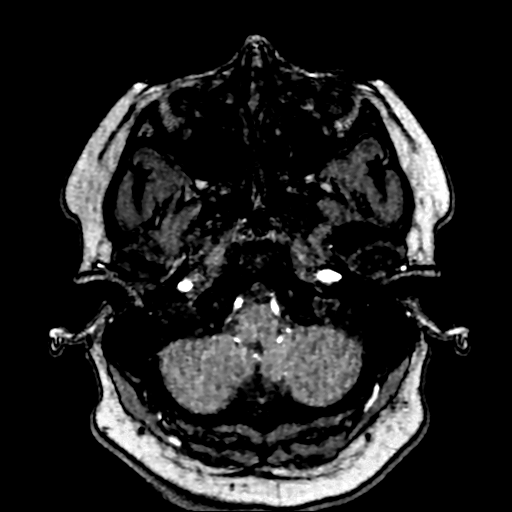
[im 40/205]
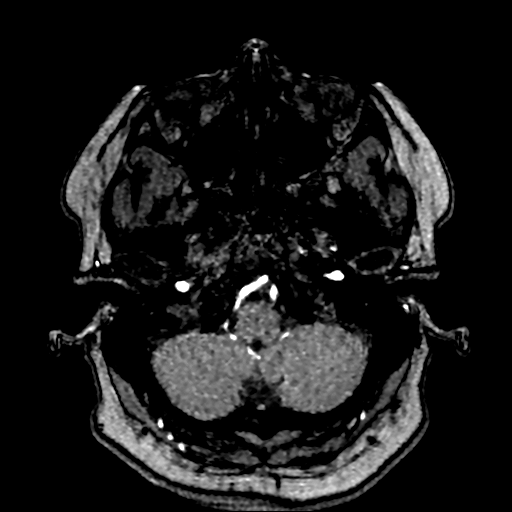
[im 44/205]
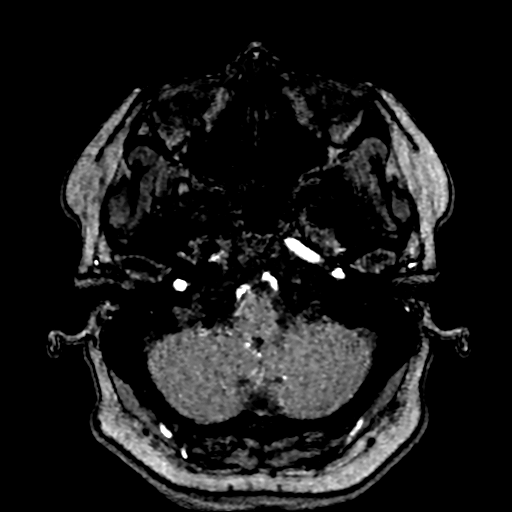
[im 66/205]
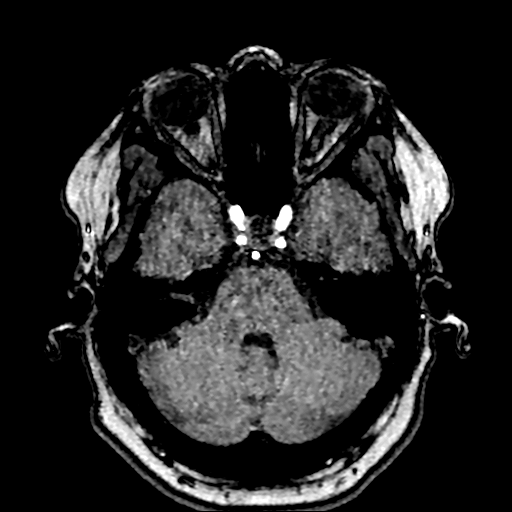
[im 92/205]
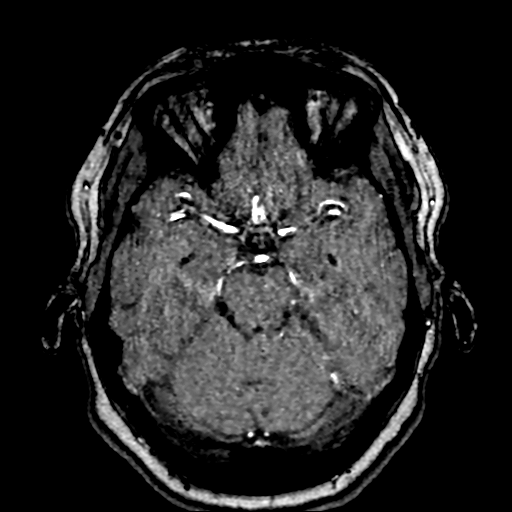
[im 105/205]
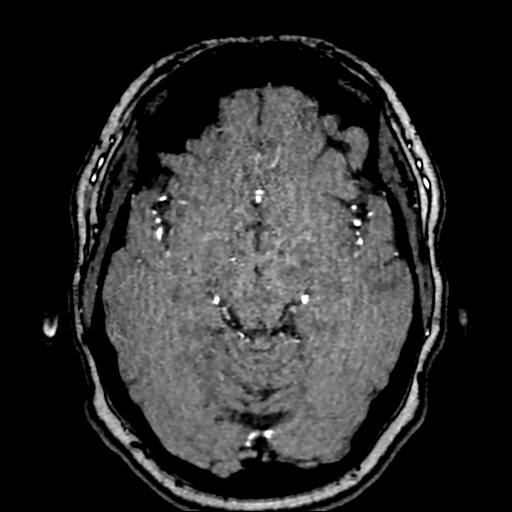
[im 118/205]
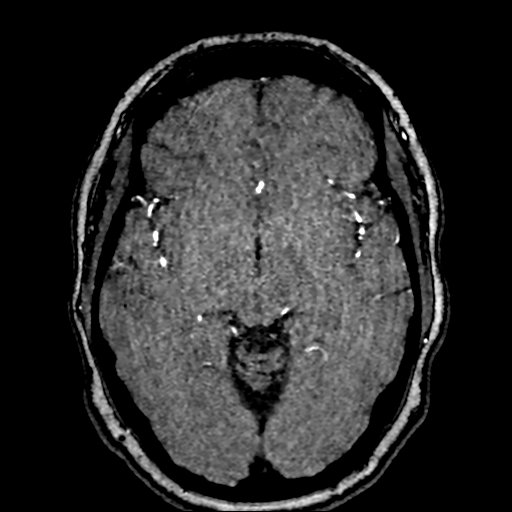
[im 144/205]
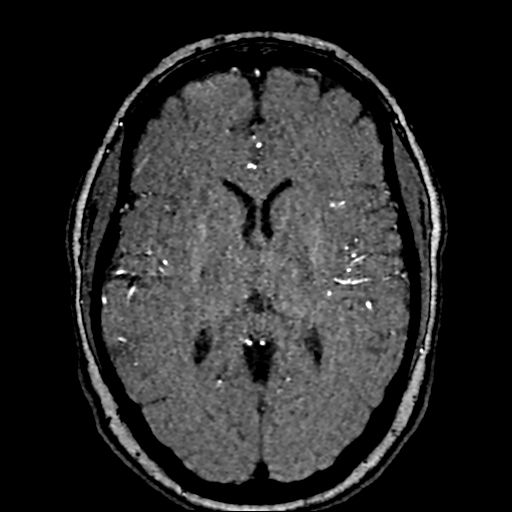
[im 170/205]
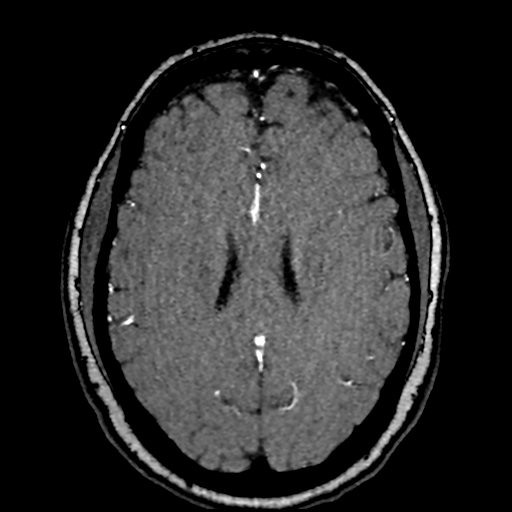
[im 174/205]
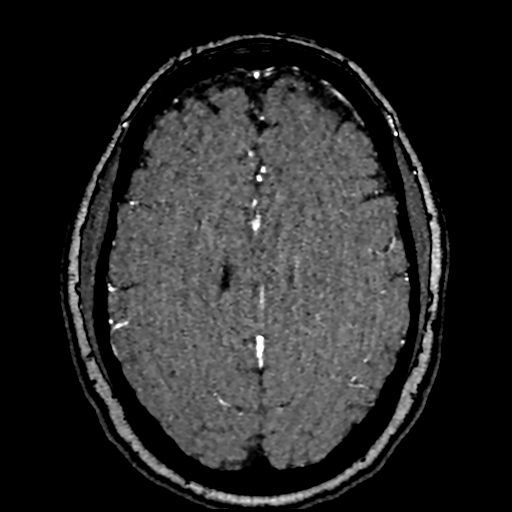
[im 196/205]
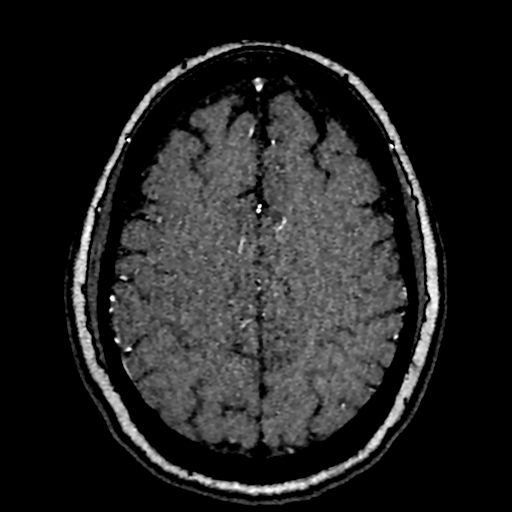

[19 of 48 positions shown; findings below may reference images not displayed]

FINDINGS: Anterior circulation: Both internal carotid arteries widely patent
to the termini without stenosis. A1 segments widely patent. Normal
anterior communicating artery complex. Both anterior cerebral
arteries widely patent to their distal aspects without stenosis. No
M1 stenosis or occlusion. Normal MCA bifurcations. Distal MCA
branches well perfused and symmetric.

Posterior circulation: Both vertebral arteries patent to the
vertebrobasilar junction without stenosis. Right PICA patent and
within normal limits. Left PICA appears to be patent proximally as
well. There is an apparent tubular outpouching/extension extending
inferiorly from the proximal left PICA, measuring 7 mm in length
(series [Q0], image 2). Finding is indeterminate, and could
potentially reflect a partially visualized branch. Possible aneurysm
or other vascular abnormality not excluded. Vertebrobasilar junction
normal. Basilar patent and normal. Superior cerebellar arteries
patent bilaterally. Both PCAs supplied via the basilar well perfused
to their distal aspects.

Anatomic variants: None significant.

Other: None.
IMPRESSION: 1. 7 mm tubular outpouching/extension extending inferiorly from the
proximal left PICA. Finding is indeterminate, and could potentially
reflect a partially visualized arterial branch arising from the left
PICA. Possible aneurysm or other vascular abnormality not excluded.
Further assessment with dedicated CTA of the head suggested for
further characterization.
2. Otherwise normal intracranial MRA.

## 2021-05-18 ENCOUNTER — Other Ambulatory Visit: Payer: Self-pay

## 2021-05-18 ENCOUNTER — Ambulatory Visit
Admission: RE | Admit: 2021-05-18 | Discharge: 2021-05-18 | Disposition: A | Discharge status: Home / Self Care | Payer: 59 | Source: Ambulatory Visit | Attending: Emergency Medicine | Admitting: Emergency Medicine

## 2021-05-18 VITALS — BP 163/91 | HR 86 | Temp 97.9°F | Resp 20

## 2021-05-18 DIAGNOSIS — L0291 Cutaneous abscess, unspecified: Secondary | ICD-10-CM | POA: Diagnosis not present

## 2021-05-18 DIAGNOSIS — R030 Elevated blood-pressure reading, without diagnosis of hypertension: Secondary | ICD-10-CM

## 2021-05-18 MED ORDER — SULFAMETHOXAZOLE-TRIMETHOPRIM 800-160 MG PO TABS: 1.0000 | ORAL_TABLET | Freq: Two times a day (BID) | ORAL | 0 refills | Status: AC

## 2021-05-18 NOTE — ED Triage Notes (Signed)
Pt here with abscess on upper left leg x 5 days. Has tried hot compresses with no relief. No drainage. Painful. Pt has history of HS

## 2021-05-18 NOTE — ED Provider Notes (Signed)
Roderic Palau    CSN: [id number] Arrival date & time: 05/18/21  [id number]      History   Chief Complaint Chief Complaint  Patient presents with   Abscess    HPI Yesenia Beasley is a 35 y.o. female.  Patient presents with a painful, red abscess on her left upper inner thigh x1 week.  No open wounds or drainage.  She denies fever, chills, or other symptoms.  She reports history of frequent abscesses due to hydradenitis suppurativa.  Treatment attempted at home with warm compresses and soaks.  Her medical history includes morbid obesity, asthma, migraine headaches, GERD, panic disorder, anxiety.  The history is provided by the patient and medical records.   Past Medical History:  Diagnosis Date   Anxiety    Genetic testing 11/09/2017   Multi-Cancer panel (83 genes) @ [facility] - No pathogenic mutations detected   Headache disorder    History of ovarian cyst    Kidney stone    Migraine    Pseudotumor cerebri    Urinary tract infection     Patient Active Problem List   Diagnosis Date Noted   Asthma 11/14/2020   Migraine 11/14/2020   Family history of heart disease in female family member before age 32 11/14/2020   Mixed hyperlipidemia 11/14/2020   Elevated LDL cholesterol level 07/23/2019   Hemorrhagic ovarian cyst 04/27/2019   Morbid obesity ([place]) 09/25/2018   Gastroesophageal reflux disease 01/06/2018   Genetic testing 11/09/2017   Dysmenorrhea 06/25/2017   Panic disorder 06/25/2017   Pseudotumor cerebri 06/25/2017   History of colon polyps 06/25/2017   Depression, recurrent ([place]) 06/25/2017   Anxiety 06/15/2017    Past Surgical History:  Procedure Laterality Date   COLONOSCOPY WITH ESOPHAGOGASTRODUODENOSCOPY (EGD)     COLONOSCOPY WITH PROPOFOL N/A 10/05/2017   Procedure: COLONOSCOPY WITH PROPOFOL;  Surgeon: Jonathon Bellows, MD;  Location: [facility] ENDOSCOPY;  Service: Gastroenterology;  Laterality: N/A;   KNEE SURGERY Bilateral 2002,2003,2014   TONSILLECTOMY      OB  History   No obstetric history on file.      Home Medications    Prior to Admission medications   Medication Sig Start Date End Date Taking? Authorizing Provider  sulfamethoxazole-trimethoprim (BACTRIM DS) 800-160 MG tablet Take 1 tablet by mouth 2 (two) times daily for 7 days. 05/18/21 05/25/21 Yes Sharion Balloon, NP  ALPRAZolam Duanne Moron) 0.25 MG tablet Take 0.25 mg by mouth daily. 10/24/20   [provider]  atorvastatin (LIPITOR) 10 MG tablet Take 10 mg by mouth daily.    [provider]  cholecalciferol (VITAMIN D3) 25 MCG (1000 UNIT) tablet Take 1,000 Units by mouth daily.    [provider]  guaiFENesin-codeine 100-10 MG/5ML syrup Take 5 mLs by mouth 3 (three) times daily as needed for cough. 01/30/21   Lesleigh Noe, MD  levalbuterol [facility] HFA) 45 MCG/ACT inhaler Inhale 1-2 puffs into the lungs every 4 (four) hours as needed for wheezing. 12/19/20   Lesleigh Noe, MD  ondansetron (ZOFRAN ODT) 4 MG disintegrating tablet Take 1 tablet (4 mg total) by mouth every 8 (eight) hours as needed. 09/18/19   Lavonia Drafts, MD  Rimegepant Sulfate 75 MG TBDP Take 1 tablet by mouth as needed. 11/07/19   [provider]  sertraline (ZOLOFT) 100 MG tablet TAKE 2 TABLETS BY MOUTH EVERY DAY 05/04/20   Elby Beck, FNP    Family History Family History  Problem Relation Age of Onset   Leukemia Mother  CMML; dx 64s; deceased 31   Heart disease Father    Colon polyps Father        initially in 80s; #/type unknown   Panic disorder Father    Heart attack Father    Wilm's tumor Sister        deceased at 26   Brain cancer Maternal Grandfather        astrocytoma; deceased 56   Heart attack Paternal Grandfather 16       deceased at 27   Thyroid cancer Maternal Uncle        dx 22s   Non-Hodgkin's lymphoma Maternal Aunt    Panic disorder Sister     Social History Social History   Tobacco Use   Smoking status: Every Day    Packs/day: 0.50    Years:  10.00    Pack years: 5.00    Types: Cigarettes   Smokeless tobacco: Never  Vaping Use   Vaping Use: Never used  Substance Use Topics   Alcohol use: Never   Drug use: No     Allergies   Patient has no known allergies.   Review of Systems Review of Systems  Constitutional:  Negative for chills and fever.  Respiratory:  Negative for cough and shortness of breath.   Cardiovascular:  Negative for chest pain and palpitations.  Skin:  Positive for color change and wound.  Neurological:  Negative for weakness and numbness.  All other systems reviewed and are negative.   Physical Exam Triage Vital Signs ED Triage Vitals  Enc Vitals Group     BP      Pulse      Resp      Temp      Temp src      SpO2      Weight      Height      Head Circumference      Peak Flow      Pain Score      Pain Loc      Pain Edu?      Excl. in [place]?    No data found.  Updated Vital Signs BP (!) 163/91   Pulse 86   Temp 97.9 F (36.6 C)   Resp 20   SpO2 97%   Visual Acuity Right Eye Distance:   Left Eye Distance:   Bilateral Distance:    Right Eye Near:   Left Eye Near:    Bilateral Near:     Physical Exam Vitals and nursing note reviewed.  Constitutional:      General: She is not in acute distress.    Appearance: She is well-developed. She is obese.  HENT:     Head: Normocephalic and atraumatic.     Mouth/Throat:     Mouth: Mucous membranes are moist.  Eyes:     Conjunctiva/sclera: Conjunctivae normal.  Cardiovascular:     Rate and Rhythm: Normal rate and regular rhythm.     Heart sounds: Normal heart sounds.  Pulmonary:     Effort: Pulmonary effort is normal. No respiratory distress.     Breath sounds: Normal breath sounds.  Abdominal:     Palpations: Abdomen is soft.     Tenderness: There is no abdominal tenderness.  Musculoskeletal:        General: Normal range of motion.     Cervical back: Neck supple.  Skin:    General: Skin is warm and dry.     Findings:  Erythema and lesion  present.     Comments: 6 x 4 cm area of tender induration with mild erythema.  Center is soft and fluctuant.  No open wounds or drainage.  Neurological:     General: No focal deficit present.     Mental Status: She is alert and oriented to person, place, and time.     Sensory: No sensory deficit.     Motor: No weakness.     Gait: Gait normal.  Psychiatric:        Mood and Affect: Mood normal.        Behavior: Behavior normal.     UC Treatments / Results  Labs (all labs ordered are listed, but only abnormal results are displayed) Labs Reviewed - No data to display  EKG   Radiology No results found.  Procedures Incision and Drainage  Date/Time: 05/18/2021 9:48 AM Performed by: Sharion Balloon, NP Authorized by: Sharion Balloon, NP   Consent:    Consent obtained:  Verbal   Consent given by:  Patient   Risks discussed:  Bleeding, incomplete drainage, infection and pain Universal protocol:    Procedure explained and questions answered to patient or proxy's satisfaction: yes   Location:    Type:  Abscess   Location:  Lower extremity   Lower extremity location:  Leg Pre-procedure details:    Skin preparation:  Povidone-iodine Anesthesia:    Anesthesia method:  Local infiltration   Local anesthetic:  Lidocaine 1% w/o epi Procedure type:    Complexity:  Simple Procedure details:    Incision types:  Single straight   Drainage:  Purulent and bloody   Drainage amount:  Copious   Wound treatment:  Wound left open   Packing materials:  None Post-procedure details:    Procedure completion:  Tolerated well, no immediate complications (including critical care time)  Medications Ordered in UC Medications - No data to display  Initial Impression / Assessment and Plan / UC Course  I have reviewed the triage vital signs and the nursing notes.  Pertinent labs & imaging results that were available during my care of the patient were reviewed by me and considered  in my medical decision making (see chart for details).  Abscess of left upper thigh.  Elevated blood pressure reading.  I&D performed with copious return of purulent and bloody drainage.  Wound care instructions discussed and education provided on abscess.  Treating with Septra DS.  Instructed patient to follow-up with her PCP if her symptoms are not improving.  Also discussed that her blood pressure is elevated today and needs to be rechecked by her PCP in 2 to 4 weeks.  Education provided on preventing hypertension.  She agrees to plan of care.   Final Clinical Impressions(s) / UC Diagnoses   Final diagnoses:  Abscess  Elevated blood pressure reading     Discharge Instructions      Take the antibiotic as directed.  Keep your wound clean and dry.  Wash it gently twice a day with soap and water.  Follow up with your primary care provider if your symptoms are not improving.    Your blood pressure is elevated today at 163/91.  Please have this rechecked by your primary care provider in 2-4 weeks.            ED Prescriptions     Medication Sig Dispense Auth. Provider   sulfamethoxazole-trimethoprim (BACTRIM DS) 800-160 MG tablet Take 1 tablet by mouth 2 (two) times daily for 7 days. [place]  tablet Sharion Balloon, NP      PDMP not reviewed this encounter.   Sharion Balloon, NP 05/18/21 [id number]

## 2021-05-18 NOTE — Discharge Instructions (Addendum)
Take the antibiotic as directed.  Keep your wound clean and dry.  Wash it gently twice a day with soap and water.  Follow up with your primary care provider if your symptoms are not improving.    Your blood pressure is elevated today at 163/91.  Please have this rechecked by your primary care provider in 2-4 weeks.

## 2021-05-20 ENCOUNTER — Ambulatory Visit (INDEPENDENT_AMBULATORY_CARE_PROVIDER_SITE_OTHER): Payer: 59 | Admitting: Family Medicine

## 2021-05-20 ENCOUNTER — Other Ambulatory Visit: Payer: Self-pay

## 2021-05-20 VITALS — BP 130/82 | HR 87 | Temp 97.0°F | Ht 65.0 in | Wt 326.0 lb

## 2021-05-20 DIAGNOSIS — E7800 Pure hypercholesterolemia, unspecified: Secondary | ICD-10-CM | POA: Diagnosis not present

## 2021-05-20 DIAGNOSIS — E559 Vitamin D deficiency, unspecified: Secondary | ICD-10-CM

## 2021-05-20 DIAGNOSIS — G8929 Other chronic pain: Secondary | ICD-10-CM

## 2021-05-20 DIAGNOSIS — R519 Headache, unspecified: Secondary | ICD-10-CM

## 2021-05-20 DIAGNOSIS — R9389 Abnormal findings on diagnostic imaging of other specified body structures: Secondary | ICD-10-CM | POA: Diagnosis not present

## 2021-05-20 DIAGNOSIS — R5383 Other fatigue: Secondary | ICD-10-CM | POA: Diagnosis not present

## 2021-05-20 DIAGNOSIS — R635 Abnormal weight gain: Secondary | ICD-10-CM | POA: Diagnosis not present

## 2021-05-20 DIAGNOSIS — E538 Deficiency of other specified B group vitamins: Secondary | ICD-10-CM

## 2021-05-20 LAB — VITAMIN D 25 HYDROXY (VIT D DEFICIENCY, FRACTURES): VITD: 28.94 ng/mL — ABNORMAL LOW (ref 30.00–100.00)

## 2021-05-20 LAB — LIPID PANEL
Cholesterol: 168 mg/dL (ref 0–200)
HDL: 23.7 mg/dL — ABNORMAL LOW (ref >39.00)
LDL Cholesterol: 108 mg/dL — ABNORMAL HIGH (ref 0–99)
NonHDL: 144.61
Total CHOL/HDL Ratio: 7
Triglycerides: 181 mg/dL — ABNORMAL HIGH (ref 0.0–149.0)
VLDL: 36.2 mg/dL (ref 0.0–40.0)

## 2021-05-20 LAB — VITAMIN B12: Vitamin B-12: 172 pg/mL — ABNORMAL LOW (ref 211–911)

## 2021-05-20 LAB — TSH: TSH: 2.06 u[IU]/mL (ref 0.35–5.50)

## 2021-05-20 MED ORDER — VITAMIN D (ERGOCALCIFEROL) 1.25 MG (50000 UNIT) PO CAPS: 50000.0000 [IU] | ORAL_CAPSULE | ORAL | 1 refills | Status: DC

## 2021-05-20 NOTE — Progress Notes (Signed)
Subjective:     Yesenia Beasley is a 35 y.o. female presenting for Follow-up (6 mo- weight loss/smoking/Declined flu shot )     HPI  #Elevated blood pressure - went to urgent care and it was high - does not get high at home - was hot that day  - usually 120/79  #abscess - healing well - no issues  #Abnormal MRI - ordered by her neurologist - has an area that is unclear - has been asking her neurologist to order this, but it has not been done  #Weight loss - interested in [place] - having a hard time losing weight  -   #anxiety - weaned off effexor and xanax - went through some withdrawal symptoms - was dizzy and brain zaps - thinks this lead to some weight weight gain   Review of Systems   Social History   Tobacco Use  Smoking Status Every Day   Packs/day: 0.50   Years: 10.00   Pack years: 5.00   Types: Cigarettes  Smokeless Tobacco Never        Objective:    BP Readings from Last 3 Encounters:  05/20/21 130/82  05/18/21 (!) 163/91  01/19/21 (!) 141/106   Wt Readings from Last 3 Encounters:  05/20/21 (!) 326 lb (147.9 kg)  01/30/21 290 lb (131.5 kg)  01/19/21 300 lb (136.1 kg)    BP 130/82   Pulse 87   Temp (!) 97 F (36.1 C) (Temporal)   Ht '5\' 5"'$  (1.651 m)   Wt (!) 326 lb (147.9 kg)   SpO2 97%   BMI 54.25 kg/m    Physical Exam Constitutional:      General: She is not in acute distress.    Appearance: She is well-developed. She is not diaphoretic.  HENT:     Right Ear: External ear normal.     Left Ear: External ear normal.  Eyes:     Conjunctiva/sclera: Conjunctivae normal.  Cardiovascular:     Rate and Rhythm: Normal rate and regular rhythm.     Heart sounds: No murmur heard. Pulmonary:     Effort: Pulmonary effort is normal. No respiratory distress.     Breath sounds: Normal breath sounds. No wheezing.  Musculoskeletal:     Cervical back: Neck supple.  Skin:    General: Skin is warm and dry.     Capillary Refill:  Capillary refill takes less than 2 seconds.  Neurological:     Mental Status: She is alert. Mental status is at baseline.  Psychiatric:        Mood and Affect: Mood normal.        Behavior: Behavior normal.    Lab Results  Component Value Date   CHOL 168 05/20/2021   HDL 23.70 (L) 05/20/2021   LDLCALC 108 (H) 05/20/2021   TRIG 181.0 (H) 05/20/2021   CHOLHDL 7 05/20/2021         Assessment & Plan:   Problem List Items Addressed This Visit       Other   Morbid obesity ([place])    Pt with recent 30 lb weight gain. Discussed starting either ozempic or Mourjaro - she will check with insurance. Discussed both will slowly increase dose monthly and possible side effects      Elevated LDL cholesterol level    Lab Results  Component Value Date   LDLCALC 108 (H) 05/20/2021  Stable off medication. Will continue to work on weight loss and lifestyle changes.  Relevant Orders   Lipid panel (Completed)   Weight gain    TSH normal. Previously blood works was normal. Suspect it is related to change in activity. Work on exercise and healthy diet.       Relevant Orders   Lipid panel (Completed)   TSH (Completed)   Vitamin D deficiency    Slightly low. Will do replacement weekly, followed by daily supplement. Likely contributing to some fatigue      Relevant Medications   Vitamin D, Ergocalciferol, (DRISDOL) 1.25 MG (50000 UNIT) CAPS capsule   Vitamin B12 deficiency    Low with fatigue. Will recommend daily supplement and recheck at next lab draw.       Abnormal MRI - Primary    Reviewed brain MRI done by neurology with concern for possible aneurysm. Pt has chronic HA and pseudotumor cerebri. Will get recommended CTA to further evaluate. No new symptoms per patient      Relevant Orders   CT ANGIO HEAD W OR WO CONTRAST   Other Visit Diagnoses     Chronic nonintractable headache, unspecified headache type       Relevant Orders   CT ANGIO HEAD W OR WO CONTRAST   Other  fatigue       Relevant Orders   TSH (Completed)   Vitamin B12 (Completed)   Vitamin D, 25-hydroxy (Completed)        Return if symptoms worsen or fail to improve.  Lesleigh Noe, MD  This visit occurred during the SARS-CoV-2 public health emergency.  Safety protocols were in place, including screening questions prior to the visit, additional usage of staff PPE, and extensive cleaning of exam room while observing appropriate contact time as indicated for disinfecting solutions.

## 2021-05-20 NOTE — Assessment & Plan Note (Signed)
Pt with recent 30 lb weight gain. Discussed starting either ozempic or Mourjaro - she will check with insurance. Discussed both will slowly increase dose monthly and possible side effects

## 2021-05-20 NOTE — Assessment & Plan Note (Signed)
Slightly low. Will do replacement weekly, followed by daily supplement. Likely contributing to some fatigue

## 2021-05-20 NOTE — Assessment & Plan Note (Signed)
Lab Results  Component Value Date   LDLCALC 108 (H) 05/20/2021   Stable off medication. Will continue to work on weight loss and lifestyle changes.

## 2021-05-20 NOTE — Assessment & Plan Note (Signed)
TSH normal. Previously blood works was normal. Suspect it is related to change in activity. Work on exercise and healthy diet.

## 2021-05-20 NOTE — Assessment & Plan Note (Signed)
Reviewed brain MRI done by neurology with concern for possible aneurysm. Pt has chronic HA and pseudotumor cerebri. Will get recommended CTA to further evaluate. No new symptoms per patient

## 2021-05-20 NOTE — Patient Instructions (Addendum)
Check with insurance on Ozempic vs Mourjaro   Blood work today  Plan  - Pharmacist, community after 3 weeks for dose increase - tenatively plan for a follow-up visit in 2-3 months  - could do virtual visit or in-person with one of the other providers

## 2021-05-20 NOTE — Assessment & Plan Note (Signed)
Low with fatigue. Will recommend daily supplement and recheck at next lab draw.

## 2021-06-25 ENCOUNTER — Ambulatory Visit
Admission: RE | Admit: 2021-06-25 | Discharge: 2021-06-25 | Disposition: A | Discharge status: Home / Self Care | Payer: 59 | Source: Ambulatory Visit | Attending: Family Medicine | Admitting: Family Medicine

## 2021-06-25 ENCOUNTER — Other Ambulatory Visit: Payer: Self-pay

## 2021-06-25 DIAGNOSIS — R9389 Abnormal findings on diagnostic imaging of other specified body structures: Secondary | ICD-10-CM | POA: Insufficient documentation

## 2021-06-25 DIAGNOSIS — R519 Headache, unspecified: Secondary | ICD-10-CM | POA: Insufficient documentation

## 2021-06-25 DIAGNOSIS — G8929 Other chronic pain: Secondary | ICD-10-CM | POA: Insufficient documentation

## 2021-06-25 HISTORY — DX: Unspecified asthma, uncomplicated: J45.909

## 2021-06-25 IMAGING — CT CT ANGIO HEAD
4 of 11 series · 16 of 47 positions shown · IV contrast (omnipaque)
Comparison: Head MRA [DATE], head MRV [DATE], and head CT
[DATE]

CLINICAL DATA: Abnormal MRI. Chronic headaches. History of
idiopathic intracranial hypertension.

EXAM:
CT ANGIOGRAPHY HEAD
TECHNIQUE: Multidetector CT imaging of the head was performed using the
standard protocol during bolus administration of intravenous
contrast. Multiplanar CT image reconstructions and MIPs were
obtained to evaluate the vascular anatomy.
CONTRAST:  75mL OMNIPAQUE IOHEXOL 350 MG/ML SOLN

[Series 12: cta head cta brain 2.00 ax · axial · 0.37mm/px · 1 of 75 slices shown]
[im 15/75  brain]
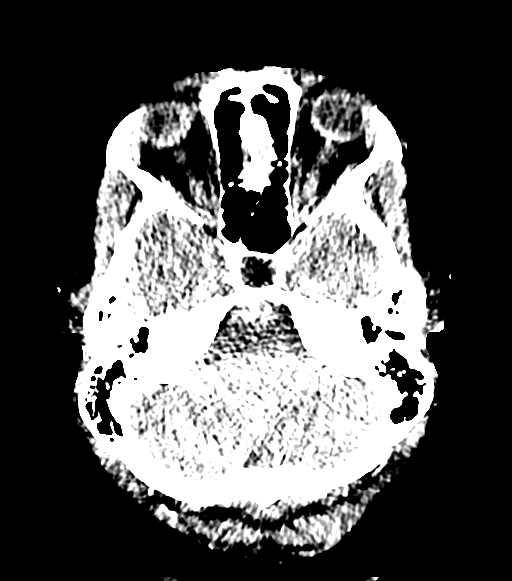

[Series 14: ax thin mips cta brain 1.00 · axial · 0.40mm/px · z∈[-569,-457]mm · 9 of 142 slices shown]
[im 15/142  brain]
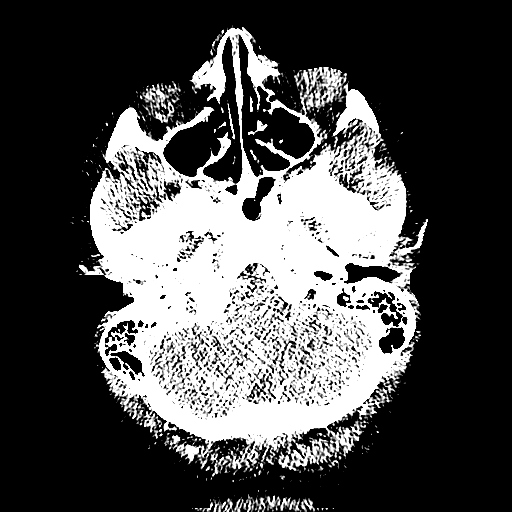
[im 29/142  bone]
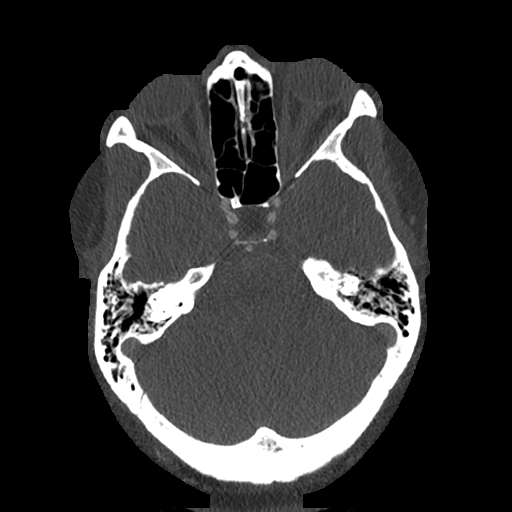
[im 43/142  brain]
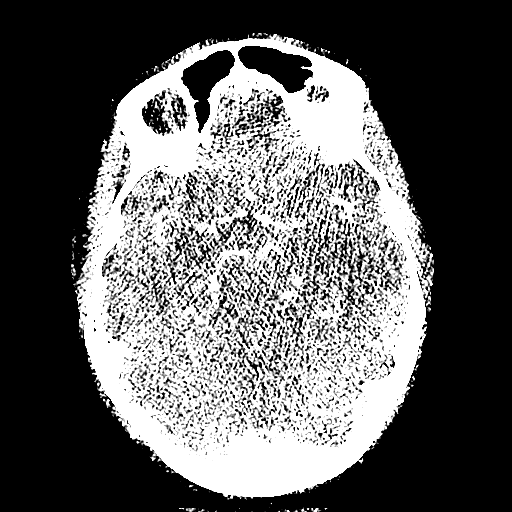
[im 57/142  bone]
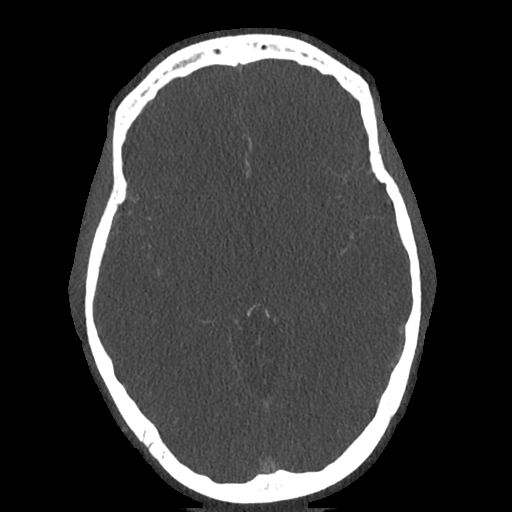
[im 71/142  brain]
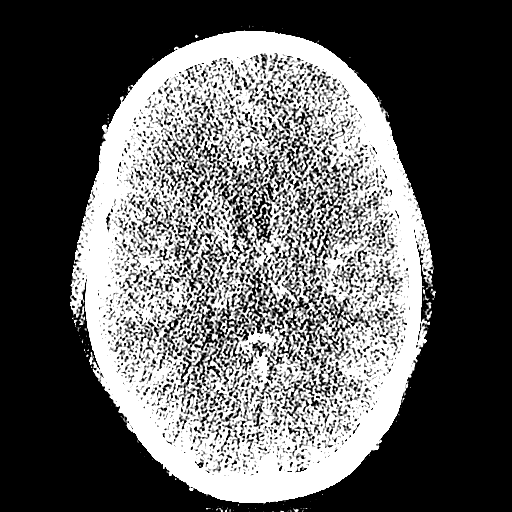
[im 85/142  bone]
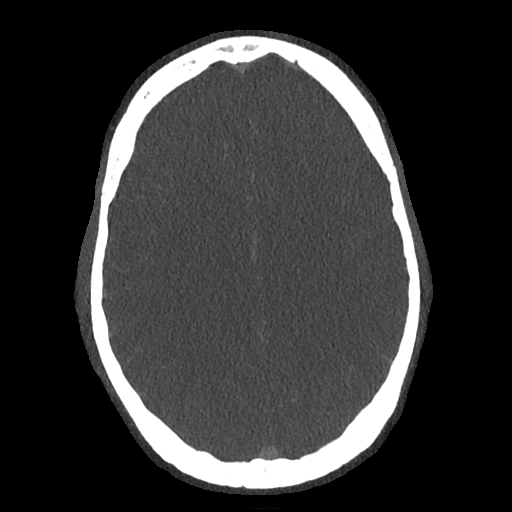
[im 99/142  brain]
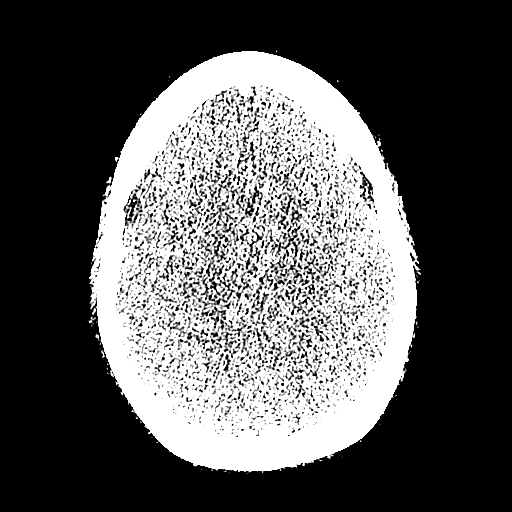
[im 113/142  bone]
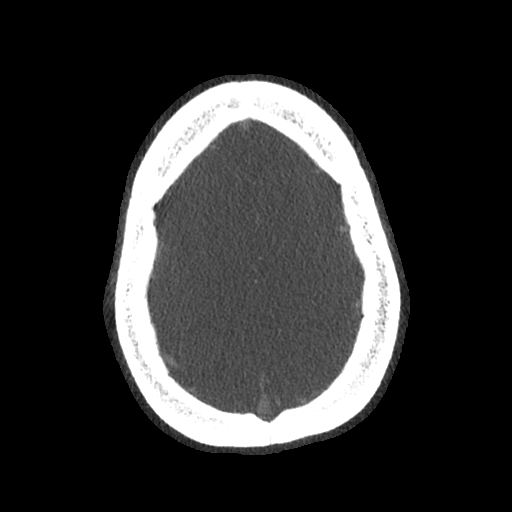
[im 127/142  brain]
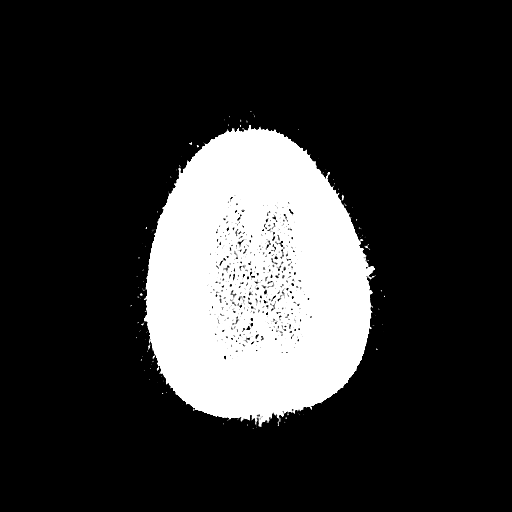

[Series 15: cor thin mips cta brain 1.00 cor · coronal · 0.31mm/px · 3 of 206 slices shown]
[im 59/206  brain]
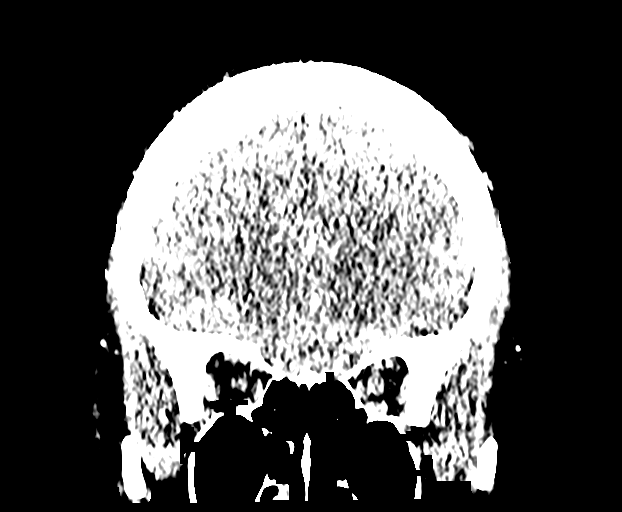
[im 88/206  brain]
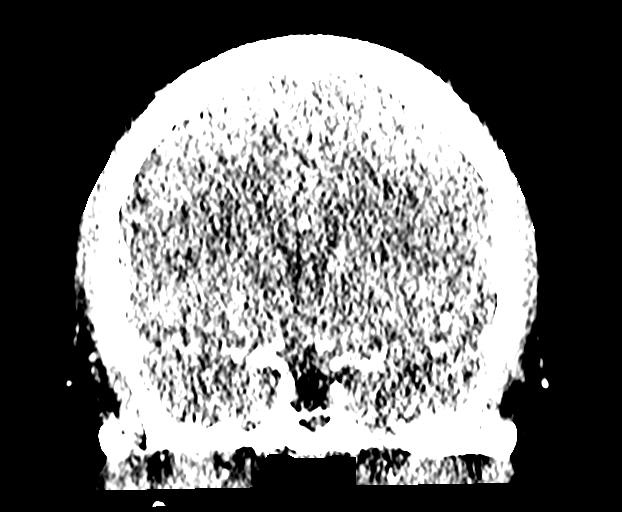
[im 118/206  brain]
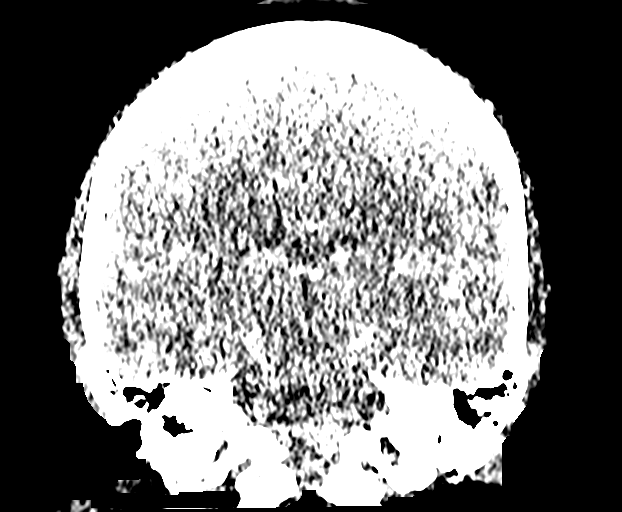

[Series 17: sag thin mips cta brain 1.00 sag · sagittal · 0.28mm/px · 3 of 190 slices shown]
[im 38/190  brain]
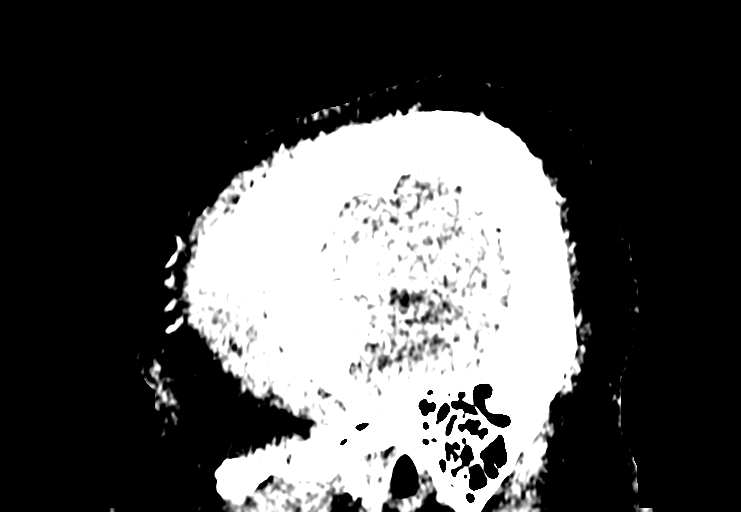
[im 76/190  brain]
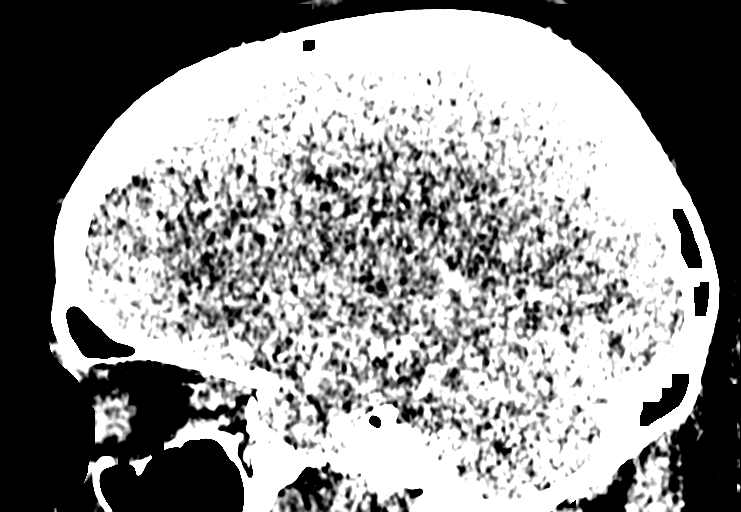
[im 114/190  brain]
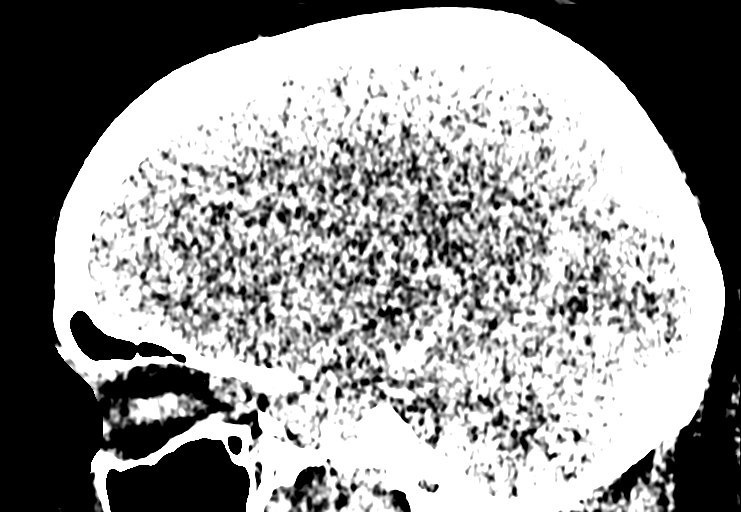

[16 of 47 positions shown; findings below may reference images not displayed]

FINDINGS: CT HEAD

Brain: There is no evidence of an acute infarct, intracranial
hemorrhage, mass, midline shift, or extra-axial fluid collection.
The ventricles and sulci are normal. A mildly enlarged partially
empty sella is unchanged.

Vascular: No hyperdense vessel.

Skull: No fracture or suspicious osseous lesion.

Sinuses: Visualized paranasal sinuses and mastoid air cells are
clear.

Other: Unremarkable orbits.

CTA HEAD

Anterior circulation: The internal carotid arteries are widely
patent from skull base to carotid termini. ACAs and MCAs are patent
with a diffusely irregular appearance which is felt to be technical.
No aneurysm is identified.

Posterior circulation: The included intracranial portions of the
vertebral arteries are patent to the basilar. Assessment of the left
PICA in the area of the abnormality described on the prior MRA is
limited by small vessel size and image noise, however when
correlating today's CTA with both the prior MRA and
contrast-enhanced MRV, the appearance on MRA is favored to reflect
prominent inferior looping of the PICA. A discrete aneurysm or other
vascular malformation is not identified. The basilar artery is
widely patent. Both PCAs are patent with a diffusely irregular
appearance which is felt to be technical.

Venous sinuses: As permitted by contrast timing, patent.

Anatomic variants: None.

Review of the MIP images confirms the above findings.
IMPRESSION: 1. Prominent looping of the proximal left PICA which is felt to
account for the appearance on MRA. No definite aneurysm or other
vascular malformation identified.
2. Otherwise negative head CTA.

## 2021-06-25 MED ORDER — IOHEXOL 350 MG/ML SOLN
75.0000 mL | Freq: Once | INTRAVENOUS | Status: AC | PRN
  Administered 2021-06-25: 75 mL via INTRAVENOUS

## 2021-07-23 ENCOUNTER — Encounter: Payer: Self-pay | Admitting: Family Medicine

## 2021-07-25 MED ORDER — OZEMPIC (0.25 OR 0.5 MG/DOSE) 2 MG/1.5ML ~~LOC~~ SOPN: PEN_INJECTOR | SUBCUTANEOUS | 0 refills | Status: DC

## 2021-09-23 DIAGNOSIS — F410 Panic disorder [episodic paroxysmal anxiety] without agoraphobia: Secondary | ICD-10-CM | POA: Diagnosis not present

## 2021-09-23 DIAGNOSIS — F5105 Insomnia due to other mental disorder: Secondary | ICD-10-CM | POA: Diagnosis not present

## 2021-09-23 DIAGNOSIS — F411 Generalized anxiety disorder: Secondary | ICD-10-CM | POA: Diagnosis not present

## 2021-11-11 ENCOUNTER — Encounter: Payer: Self-pay | Admitting: Family Medicine

## 2021-11-11 ENCOUNTER — Other Ambulatory Visit: Payer: Self-pay | Admitting: Family Medicine

## 2021-11-11 ENCOUNTER — Ambulatory Visit (INDEPENDENT_AMBULATORY_CARE_PROVIDER_SITE_OTHER): Payer: BC Managed Care – PPO | Admitting: Family Medicine

## 2021-11-11 ENCOUNTER — Other Ambulatory Visit: Payer: Self-pay

## 2021-11-11 VITALS — BP 128/70 | HR 87 | Temp 98.3°F | Ht 65.0 in | Wt 320.2 lb

## 2021-11-11 DIAGNOSIS — E559 Vitamin D deficiency, unspecified: Secondary | ICD-10-CM

## 2021-11-11 DIAGNOSIS — R232 Flushing: Secondary | ICD-10-CM | POA: Diagnosis not present

## 2021-11-11 DIAGNOSIS — R5383 Other fatigue: Secondary | ICD-10-CM | POA: Diagnosis not present

## 2021-11-11 DIAGNOSIS — N949 Unspecified condition associated with female genital organs and menstrual cycle: Secondary | ICD-10-CM | POA: Insufficient documentation

## 2021-11-11 DIAGNOSIS — R221 Localized swelling, mass and lump, neck: Secondary | ICD-10-CM | POA: Insufficient documentation

## 2021-11-11 DIAGNOSIS — R7303 Prediabetes: Secondary | ICD-10-CM | POA: Insufficient documentation

## 2021-11-11 DIAGNOSIS — E538 Deficiency of other specified B group vitamins: Secondary | ICD-10-CM

## 2021-11-11 LAB — COMPREHENSIVE METABOLIC PANEL
ALT: 11 U/L (ref 0–35)
AST: 9 U/L (ref 0–37)
Albumin: 4.2 g/dL (ref 3.5–5.2)
Alkaline Phosphatase: 71 U/L (ref 39–117)
BUN: 9 mg/dL (ref 6–23)
CO2: 24 mEq/L (ref 19–32)
Calcium: 9 mg/dL (ref 8.4–10.5)
Chloride: 105 mEq/L (ref 96–112)
Creatinine, Ser: 0.76 mg/dL (ref 0.40–1.20)
GFR: 100.01 mL/min (ref >60.00)
Glucose, Bld: 107 mg/dL — ABNORMAL HIGH (ref 70–99)
Potassium: 3.9 mEq/L (ref 3.5–5.1)
Sodium: 137 mEq/L (ref 135–145)
Total Bilirubin: 0.5 mg/dL (ref 0.2–1.2)
Total Protein: 6.5 g/dL (ref 6.0–8.3)

## 2021-11-11 LAB — CBC WITH DIFFERENTIAL/PLATELET
Basophils Absolute: 0 10*3/uL (ref 0.0–0.1)
Basophils Relative: 0.5 % (ref 0.0–3.0)
Eosinophils Absolute: 0.3 10*3/uL (ref 0.0–0.7)
Eosinophils Relative: 3 % (ref 0.0–5.0)
HCT: 43 % (ref 36.0–46.0)
Hemoglobin: 14.4 g/dL (ref 12.0–15.0)
Lymphocytes Relative: 15 % (ref 12.0–46.0)
Lymphs Abs: 1.5 10*3/uL (ref 0.7–4.0)
MCHC: 33.5 g/dL (ref 30.0–36.0)
MCV: 87.1 fl (ref 78.0–100.0)
Monocytes Absolute: 0.3 10*3/uL (ref 0.1–1.0)
Monocytes Relative: 3.1 % (ref 3.0–12.0)
Neutro Abs: 7.7 10*3/uL (ref 1.4–7.7)
Neutrophils Relative %: 78.4 % — ABNORMAL HIGH (ref 43.0–77.0)
Platelets: 186 10*3/uL (ref 150.0–400.0)
RBC: 4.93 Mil/uL (ref 3.87–5.11)
RDW: 15.6 % — ABNORMAL HIGH (ref 11.5–15.5)
WBC: 9.8 10*3/uL (ref 4.0–10.5)

## 2021-11-11 LAB — VITAMIN D 25 HYDROXY (VIT D DEFICIENCY, FRACTURES): VITD: 19.81 ng/mL — ABNORMAL LOW (ref 30.00–100.00)

## 2021-11-11 LAB — HEMOGLOBIN A1C: Hgb A1c MFr Bld: 5.7 % (ref 4.6–6.5)

## 2021-11-11 LAB — VITAMIN B12: Vitamin B-12: 166 pg/mL — ABNORMAL LOW (ref 211–911)

## 2021-11-11 LAB — TSH: TSH: 2.52 u[IU]/mL (ref 0.35–5.50)

## 2021-11-11 MED ORDER — TIRZEPATIDE 2.5 MG/0.5ML ~~LOC~~ SOAJ: 2.5000 mg | SUBCUTANEOUS | 0 refills | Status: DC

## 2021-11-11 MED ORDER — VITAMIN D (ERGOCALCIFEROL) 1.25 MG (50000 UNIT) PO CAPS: 50000.0000 [IU] | ORAL_CAPSULE | ORAL | 1 refills | Status: DC

## 2021-11-11 NOTE — Assessment & Plan Note (Signed)
Anterior neck discomfort with palpation as well as patient noted swelling.  Unable to appreciate any distinct masses.  We will check thyroid studies today, if negative and symptoms persistent may be reasonable to get ultrasound to rule out any worrisome findings. ?

## 2021-11-11 NOTE — Assessment & Plan Note (Signed)
Etiology unclear, she denies any GI symptoms and has normal blood pressure.  We will check thyroid as well as blood counts today and liver function tests.  Lower suspicion for menopause however she does have referral for GYN for additional work-up if needed. ?

## 2021-11-11 NOTE — Assessment & Plan Note (Signed)
Unable to get Ozempic paid for, will try to get Avala approved with insurance company.  Screen for diabetes today.  She does have hyperlipidemia and depression complicating her obesity. ?

## 2021-11-11 NOTE — Assessment & Plan Note (Signed)
Repeat labs today in setting of persistent fatigue.  Plan for replacement if still low. ?

## 2021-11-11 NOTE — Patient Instructions (Addendum)
Genital  ?- referral to GYN ? ?Neck  ?- labs today to evaluate thyroid ?- if not improving we could get ultrasound ? ?Facial flushing ?- labs today ? ?Fatigue ?- repeat labs ?- may be low Vitamin D - take 1000 units daily ?- Low Vit B12 - plan injections and then oral ? ?

## 2021-11-11 NOTE — Assessment & Plan Note (Signed)
She has not been taking a supplement and continues to have fatigue.  Repeat labs today if low will plan for injections followed by oral supplement ?

## 2021-11-11 NOTE — Assessment & Plan Note (Signed)
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  Foot exam today but patient also would like to establish with GYN referral placed for this. ?

## 2021-11-11 NOTE — Progress Notes (Signed)
? ?Subjective:  ? ?  ?Yesenia Beasley is a 36 y.o. female presenting for Fatigue, Neck Pain ("Feels like its bulging" ), and Hot Flashes (Flush and hot to the touch in her face ) ?  ? ? ?Neck Pain  ?Pertinent negatives include no chest pain or fever.  ? ?#Neck ?- feels like it is bulging ?- feels swollen ?- no swallowing issues ?- down the anterior sides - some discomfort but minimal pain ? ?#facial flushing ?- will get a higher temperature on forehead but not oral ?- 6 months ?- no sweating ?- just feeling hot ?- occurs daily, lasts for 30 minutes ?- has a headache Hat which she wears  ?- no HA ?- no body warmth ?- has mentioned to other providers and has not had work-up done ? ?#Fatigue ?- tired, body aches ?- low energy ?- sleep: 3-4 hour stretches, but 7 hours per night, waking up overnight, has trouble falling back asleep but will  ?- taking naps during the day ?- no snoring, negative OSA testing ?- worse over the last 2 months ?- no change in sleep - apart from more weekend naps ? ?#genital lesion ?- 4-5 of them ?- hx of +HPV ?- bothersome for a few month ?- inside of the labia - both side ?- no drainage ?- was painful initially but pain has resolved - thought it was acne and she tried to pop this with some pain  ?- used an OTC HPV cream w/ improvement on the one lesion ?- some of them will decrease in size but do not fully resolve ?- gyn moved, trying to find a new one ? ? ?Review of Systems  ?Constitutional:  Negative for chills, diaphoresis and fever.  ?Respiratory:  Negative for cough and shortness of breath.   ?Cardiovascular:  Negative for chest pain.  ?Gastrointestinal:  Negative for abdominal pain, constipation, diarrhea and nausea.  ?Musculoskeletal:  Positive for neck pain.  ? ? ?Social History  ? ?Tobacco Use  ?Smoking Status Every Day  ? Packs/day: 0.50  ? Years: 10.00  ? Pack years: 5.00  ? Types: Cigarettes  ?Smokeless Tobacco Never  ? ? ? ?   ?Objective:  ?  ?BP Readings from Last 3 Encounters:   ?11/11/21 128/70  ?05/20/21 130/82  ?05/18/21 (!) 163/91  ? ?Wt Readings from Last 3 Encounters:  ?11/11/21 (!) 320 lb 4 oz (145.3 kg)  ?05/20/21 (!) 326 lb (147.9 kg)  ?01/30/21 290 lb (131.5 kg)  ? ? ?BP 128/70   Pulse 87   Temp 98.3 ?F (36.8 ?C) (Oral)   Ht '5\' 5"'$  (1.651 m)   Wt (!) 320 lb 4 oz (145.3 kg)   LMP 11/11/2021 (Exact Date)   SpO2 97%   BMI 53.29 kg/m?  ? ? ?Physical Exam ?Constitutional:   ?   General: She is not in acute distress. ?   Appearance: She is well-developed. She is not diaphoretic.  ?HENT:  ?   Head: Normocephalic and atraumatic.  ?   Right Ear: External ear normal.  ?   Left Ear: External ear normal.  ?   Nose: Nose normal.  ?Eyes:  ?   Conjunctiva/sclera: Conjunctivae normal.  ?Neck:  ?   Thyroid: No thyroid mass, thyromegaly or thyroid tenderness.  ?Cardiovascular:  ?   Rate and Rhythm: Normal rate and regular rhythm.  ?   Heart sounds: No murmur heard. ?Pulmonary:  ?   Effort: Pulmonary effort is normal. No respiratory distress.  ?  Breath sounds: Normal breath sounds. No wheezing or rales.  ?Musculoskeletal:  ?   Cervical back: Normal range of motion and neck supple. Tenderness (anterior neck/bilateral) present.  ?Lymphadenopathy:  ?   Cervical: No cervical adenopathy.  ?Skin: ?   General: Skin is warm and dry.  ?   Capillary Refill: Capillary refill takes less than 2 seconds.  ?Neurological:  ?   Mental Status: She is alert. Mental status is at baseline.  ?Psychiatric:     ?   Mood and Affect: Mood normal.     ?   Behavior: Behavior normal.  ? ? ? ? ? ?   ?Assessment & Plan:  ? ?Problem List Items Addressed This Visit   ? ?  ? Genitourinary  ? Genital lesion, female - Primary  ?  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  Foot exam today but patient also would like to establish with GYN referral placed for this. ?  ?  ? Relevant Orders  ? Ambulatory referral to Obstetrics / Gynecology  ?  ? Other  ? Morbid obesity ([place])  ?  Unable to get Ozempic paid for, will try to  get [facility] approved with insurance company.  Screen for diabetes today.  She does have hyperlipidemia and depression complicating her obesity. ?  ?  ? Relevant Medications  ? tirzepatide [facility]) 2.5 MG/0.5ML Pen  ? Other Relevant Orders  ? Comprehensive metabolic panel  ? Hemoglobin A1c  ? Vitamin D deficiency  ?  Repeat labs today in setting of persistent fatigue.  Plan for replacement if still low. ?  ?  ? Relevant Orders  ? Vitamin D, 25-hydroxy  ? Vitamin B12 deficiency  ?  She has not been taking a supplement and continues to have fatigue.  Repeat labs today if low will plan for injections followed by oral supplement ?  ?  ? Relevant Orders  ? Vitamin B12  ? Facial flushing  ?  Etiology unclear, she denies any GI symptoms and has normal blood pressure.  We will check thyroid as well as blood counts today and liver function tests.  Lower suspicion for menopause however she does have referral for GYN for additional work-up if needed. ?  ?  ? Relevant Orders  ? TSH  ? CBC with Differential  ? ?Other Visit Diagnoses   ? ? Other fatigue      ? Relevant Orders  ? TSH  ? CBC with Differential  ? ?  ? ? ? ?Return in about 3 months (around 02/11/2022). ? ?Lesleigh Noe, MD ? ?This visit occurred during the SARS-CoV-2 public health emergency.  Safety protocols were in place, including screening questions prior to the visit, additional usage of staff PPE, and extensive cleaning of exam room while observing appropriate contact time as indicated for disinfecting solutions.  ? ?

## 2021-11-12 ENCOUNTER — Encounter: Payer: Self-pay | Admitting: Family Medicine

## 2021-11-12 DIAGNOSIS — E782 Mixed hyperlipidemia: Secondary | ICD-10-CM

## 2021-11-12 DIAGNOSIS — R7303 Prediabetes: Secondary | ICD-10-CM

## 2021-11-13 MED ORDER — WEGOVY 0.25 MG/0.5ML ~~LOC~~ SOAJ: 0.2500 mg | SUBCUTANEOUS | 0 refills | Status: DC

## 2021-11-14 ENCOUNTER — Telehealth: Payer: Self-pay

## 2021-11-14 NOTE — Telephone Encounter (Signed)
PA for [employer] submitted via covermymeds. Awaiting response ?Your information has been submitted to [place]. To check for an updated outcome later, reopen this PA request from your dashboard. ? ?If [employer] has not responded to your request within 24 hours, contact [place] at [phone]. If you think there may be a problem with your PA request, use our live chat feature at the bottom right. ? ? (Key: [id number]) ?

## 2021-11-15 NOTE — Telephone Encounter (Signed)
Pa approved for Wegovy from 11/14/21 to 06/16/22.  ?

## 2021-11-19 ENCOUNTER — Other Ambulatory Visit: Payer: Self-pay

## 2021-11-19 ENCOUNTER — Ambulatory Visit (INDEPENDENT_AMBULATORY_CARE_PROVIDER_SITE_OTHER): Payer: BC Managed Care – PPO

## 2021-11-19 DIAGNOSIS — E538 Deficiency of other specified B group vitamins: Secondary | ICD-10-CM | POA: Diagnosis not present

## 2021-11-19 MED ORDER — CYANOCOBALAMIN 1000 MCG/ML IJ SOLN
1000.0000 ug | Freq: Once | INTRAMUSCULAR | Status: AC
  Administered 2021-11-19: 1000 ug via INTRAMUSCULAR

## 2021-11-19 NOTE — Progress Notes (Signed)
Per orders of Tabith Dugal, in absence of Dr. Einar Pheasant, monthly (1 of 3) injection of B12 given by Loreen Freud. ?Patient tolerated injection well.  ?

## 2021-12-10 ENCOUNTER — Other Ambulatory Visit: Payer: Self-pay | Admitting: Family Medicine

## 2021-12-10 DIAGNOSIS — E782 Mixed hyperlipidemia: Secondary | ICD-10-CM

## 2021-12-10 DIAGNOSIS — R7303 Prediabetes: Secondary | ICD-10-CM

## 2021-12-10 NOTE — Telephone Encounter (Signed)
Weighed in this morning at 316.6  ?Yes I do feel nausea the first two days or so after injection but then it passes. I am definitely noticing appetite suppression. Ive been ensuring I drink enough water every day and get as much fiber and protein as so can.  ?I have no objections to continuing on the .25 to allow my body to get more used to it, but I?m open to going up as well.  ?Thanks!  ?

## 2021-12-10 NOTE — Telephone Encounter (Signed)
Mychart to pt: ?Yesenia Beasley, how have you been feeling on the 0.25 wegovy? Any side effects? Also, do you have a way to check your weight? If, So could you let me know what that is, please?  ?  ?Christy  ?

## 2021-12-10 NOTE — Telephone Encounter (Signed)
Mychart sent.

## 2021-12-11 MED ORDER — WEGOVY 0.25 MG/0.5ML ~~LOC~~ SOAJ: 0.2500 mg | SUBCUTANEOUS | 0 refills | Status: DC

## 2021-12-18 DIAGNOSIS — F5105 Insomnia due to other mental disorder: Secondary | ICD-10-CM | POA: Diagnosis not present

## 2021-12-18 DIAGNOSIS — F410 Panic disorder [episodic paroxysmal anxiety] without agoraphobia: Secondary | ICD-10-CM | POA: Diagnosis not present

## 2021-12-18 DIAGNOSIS — F411 Generalized anxiety disorder: Secondary | ICD-10-CM | POA: Diagnosis not present

## 2021-12-26 ENCOUNTER — Telehealth: Payer: Self-pay

## 2021-12-26 NOTE — Telephone Encounter (Signed)
Left message for pt to call office back regarding referral that was sent to [facility].  

## 2021-12-30 ENCOUNTER — Other Ambulatory Visit: Payer: Self-pay | Admitting: Family Medicine

## 2021-12-30 DIAGNOSIS — J4520 Mild intermittent asthma, uncomplicated: Secondary | ICD-10-CM

## 2022-01-01 ENCOUNTER — Ambulatory Visit (INDEPENDENT_AMBULATORY_CARE_PROVIDER_SITE_OTHER): Payer: BC Managed Care – PPO

## 2022-01-01 DIAGNOSIS — E538 Deficiency of other specified B group vitamins: Secondary | ICD-10-CM | POA: Diagnosis not present

## 2022-01-01 MED ORDER — CYANOCOBALAMIN 1000 MCG/ML IJ SOLN
1000.0000 ug | Freq: Once | INTRAMUSCULAR | Status: AC
  Administered 2022-01-01: 1000 ug via INTRAMUSCULAR

## 2022-01-01 NOTE — Progress Notes (Signed)
? ?  Nurse Visit  ? ?Date of Encounter: 01/01/2022 ?ID: Shaunessy Dobratz, DOB 12/13/1985, MRN [id number] ? ?PCP:  Lesleigh Noe, MD ?  ? ?Visit Details  ? ?VS:  Pt came in to receive her 2nd Vit B12 injection . Pt was given injection IM Vit B12 1,000 mcg/ml in the left Deltoid successfully w/o any concern ?  ? ?Reason for visit: Vit B12  ? ? ? ? ?Medications Adjustments/Labs and Tests Ordered: ?No orders of the defined types were placed in this encounter. ? ?Meds ordered this encounter  ?Medications  ? cyanocobalamin ((VITAMIN B-12)) injection 1,000 mcg  ? ? ? ?Signed, ?Mardelle Matte, CMA  ?01/01/2022 3:33 PM  ?

## 2022-01-06 ENCOUNTER — Other Ambulatory Visit: Payer: Self-pay | Admitting: Family Medicine

## 2022-01-06 DIAGNOSIS — R7303 Prediabetes: Secondary | ICD-10-CM

## 2022-01-06 DIAGNOSIS — E782 Mixed hyperlipidemia: Secondary | ICD-10-CM

## 2022-01-09 MED ORDER — WEGOVY 0.25 MG/0.5ML ~~LOC~~ SOAJ: 0.5000 mg | SUBCUTANEOUS | 0 refills | Status: DC

## 2022-01-13 ENCOUNTER — Telehealth: Payer: Self-pay

## 2022-01-13 NOTE — Telephone Encounter (Signed)
Left message for pt to call office back regarding referral that was sent to [facility]. Second call attempted  ?

## 2022-02-12 ENCOUNTER — Encounter: Payer: Self-pay | Admitting: Family Medicine

## 2022-02-13 MED ORDER — WEGOVY 0.5 MG/0.5ML ~~LOC~~ SOAJ: 0.5000 mg | SUBCUTANEOUS | 0 refills | Status: DC

## 2022-05-23 DIAGNOSIS — R451 Restlessness and agitation: Secondary | ICD-10-CM | POA: Diagnosis not present

## 2022-05-23 DIAGNOSIS — F410 Panic disorder [episodic paroxysmal anxiety] without agoraphobia: Secondary | ICD-10-CM | POA: Diagnosis not present

## 2022-05-23 DIAGNOSIS — G43119 Migraine with aura, intractable, without status migrainosus: Secondary | ICD-10-CM | POA: Diagnosis not present

## 2022-05-23 DIAGNOSIS — G932 Benign intracranial hypertension: Secondary | ICD-10-CM | POA: Diagnosis not present

## 2022-10-01 ENCOUNTER — Telehealth: Payer: Self-pay

## 2022-10-01 ENCOUNTER — Other Ambulatory Visit: Payer: Self-pay

## 2022-10-01 ENCOUNTER — Telehealth: Payer: Self-pay | Admitting: Gastroenterology

## 2022-10-01 DIAGNOSIS — Z86010 Personal history of colonic polyps: Secondary | ICD-10-CM

## 2022-10-01 MED ORDER — NA SULFATE-K SULFATE-MG SULF 17.5-3.13-1.6 GM/177ML PO SOLN: 1.0000 | Freq: Once | ORAL | 0 refills | Status: AC

## 2022-10-01 NOTE — Telephone Encounter (Signed)
Gastroenterology Pre-Procedure Review  Request Date: 10/20/22 Requesting Physician: Dr. Vicente Males  PATIENT REVIEW QUESTIONS: The patient responded to the following health history questions as indicated:    1. Are you having any GI issues? no 2. Do you have a personal history of Polyps? yes (last colonoscopy performed by Dr. Vicente Males 10/05/2017) 3. Do you have a family history of Colon Cancer or Polyps?father colon polyps 4. Diabetes Mellitus? no 5. Joint replacements in the past 12 months?no 6. Major health problems in the past 3 months?no 7. Any artificial heart valves, MVP, or defibrillator?no    MEDICATIONS & ALLERGIES:    Patient reports the following regarding taking any anticoagulation/antiplatelet therapy:   Plavix, Coumadin, Eliquis, Xarelto, Lovenox, Pradaxa, Brilinta, or Effient? no Aspirin? no  Patient confirms/reports the following medications:  Current Outpatient Medications  Medication Sig Dispense Refill   ALPRAZolam (XANAX) 0.25 MG tablet Take 0.25 mg by mouth as needed.     cholecalciferol (VITAMIN D3) 25 MCG (1000 UNIT) tablet Take 1,000 Units by mouth daily.     levalbuterol (XOPENEX HFA) 45 MCG/ACT inhaler INHALE 1 TO 2 PUFFS BY MOUTH EVERY 4 HOURS AS NEEDED FOR WHEEZE 15 g 12   ondansetron (ZOFRAN ODT) 4 MG disintegrating tablet Take 1 tablet (4 mg total) by mouth every 8 (eight) hours as needed. 20 tablet 0   Rimegepant Sulfate 75 MG TBDP Take 1 tablet by mouth as needed.     Semaglutide-Weight Management (WEGOVY) 0.5 MG/0.5ML SOAJ Inject 0.5 mg into the skin once a week. 2 mL 0   sertraline (ZOLOFT) 100 MG tablet TAKE 2 TABLETS BY MOUTH EVERY DAY 180 tablet 0   Vitamin D, Ergocalciferol, (DRISDOL) 1.25 MG (50000 UNIT) CAPS capsule Take 1 capsule (50,000 Units total) by mouth every 7 (seven) days. 4 capsule 1   No current facility-administered medications for this visit.    Patient confirms/reports the following allergies:  No Known Allergies  No orders of the  defined types were placed in this encounter.   AUTHORIZATION INFORMATION Primary Insurance: 1D#: Group #:  Secondary Insurance: 1D#: Group #:  SCHEDULE INFORMATION: Date: 10/20/22 Time: Location: [facility]

## 2022-10-01 NOTE — Telephone Encounter (Signed)
Patient due for repeat colonoscopy. Requesting call to schedule.

## 2022-10-02 DIAGNOSIS — D518 Other vitamin B12 deficiency anemias: Secondary | ICD-10-CM | POA: Diagnosis not present

## 2022-10-02 DIAGNOSIS — F5105 Insomnia due to other mental disorder: Secondary | ICD-10-CM | POA: Diagnosis not present

## 2022-10-02 DIAGNOSIS — F410 Panic disorder [episodic paroxysmal anxiety] without agoraphobia: Secondary | ICD-10-CM | POA: Diagnosis not present

## 2022-10-02 DIAGNOSIS — F411 Generalized anxiety disorder: Secondary | ICD-10-CM | POA: Diagnosis not present

## 2022-10-20 ENCOUNTER — Ambulatory Visit: Payer: BC Managed Care – PPO | Admitting: Certified Registered Nurse Anesthetist

## 2022-10-20 ENCOUNTER — Encounter: Payer: Self-pay | Admitting: Gastroenterology

## 2022-10-20 ENCOUNTER — Ambulatory Visit
Admission: RE | Admit: 2022-10-20 | Discharge: 2022-10-20 | Disposition: A | Discharge status: Home / Self Care | Payer: BC Managed Care – PPO | Attending: Gastroenterology | Admitting: Gastroenterology

## 2022-10-20 ENCOUNTER — Encounter
Admission: RE | Disposition: A | Discharge status: Home / Self Care | Payer: Self-pay | Source: Home / Self Care | Attending: Gastroenterology

## 2022-10-20 DIAGNOSIS — J45909 Unspecified asthma, uncomplicated: Secondary | ICD-10-CM | POA: Insufficient documentation

## 2022-10-20 DIAGNOSIS — F17210 Nicotine dependence, cigarettes, uncomplicated: Secondary | ICD-10-CM | POA: Diagnosis not present

## 2022-10-20 DIAGNOSIS — D125 Benign neoplasm of sigmoid colon: Secondary | ICD-10-CM | POA: Insufficient documentation

## 2022-10-20 DIAGNOSIS — Z1211 Encounter for screening for malignant neoplasm of colon: Secondary | ICD-10-CM | POA: Insufficient documentation

## 2022-10-20 DIAGNOSIS — D126 Benign neoplasm of colon, unspecified: Secondary | ICD-10-CM | POA: Diagnosis not present

## 2022-10-20 DIAGNOSIS — K219 Gastro-esophageal reflux disease without esophagitis: Secondary | ICD-10-CM | POA: Diagnosis not present

## 2022-10-20 DIAGNOSIS — Z6841 Body Mass Index (BMI) 40.0 and over, adult: Secondary | ICD-10-CM | POA: Diagnosis not present

## 2022-10-20 DIAGNOSIS — Z83719 Family history of colon polyps, unspecified: Secondary | ICD-10-CM | POA: Insufficient documentation

## 2022-10-20 DIAGNOSIS — Z86010 Personal history of colonic polyps: Secondary | ICD-10-CM | POA: Diagnosis not present

## 2022-10-20 DIAGNOSIS — Z8719 Personal history of other diseases of the digestive system: Secondary | ICD-10-CM | POA: Diagnosis not present

## 2022-10-20 DIAGNOSIS — K635 Polyp of colon: Secondary | ICD-10-CM | POA: Diagnosis not present

## 2022-10-20 DIAGNOSIS — F418 Other specified anxiety disorders: Secondary | ICD-10-CM | POA: Diagnosis not present

## 2022-10-20 HISTORY — DX: Personal history of urinary calculi: Z87.442

## 2022-10-20 HISTORY — PX: COLONOSCOPY WITH PROPOFOL: SHX5780

## 2022-10-20 LAB — POCT PREGNANCY, URINE
Preg Test, Ur: NEGATIVE
Preg Test, Ur: NEGATIVE

## 2022-10-20 SURGERY — COLONOSCOPY WITH PROPOFOL
Anesthesia: General

## 2022-10-20 MED ORDER — PROPOFOL 500 MG/50ML IV EMUL
INTRAVENOUS | Status: DC | PRN
  Administered 2022-10-20: 150 ug/kg/min via INTRAVENOUS

## 2022-10-20 MED ORDER — LIDOCAINE HCL (CARDIAC) PF 100 MG/5ML IV SOSY
PREFILLED_SYRINGE | INTRAVENOUS | Status: DC | PRN
  Administered 2022-10-20: 50 mg via INTRAVENOUS

## 2022-10-20 MED ORDER — SODIUM CHLORIDE 0.9 % IV SOLN
INTRAVENOUS | Status: DC
  Administered 2022-10-20: 1000 mL via INTRAVENOUS

## 2022-10-20 MED ORDER — PROPOFOL 10 MG/ML IV BOLUS
INTRAVENOUS | Status: DC | PRN
  Administered 2022-10-20: 90 mg via INTRAVENOUS

## 2022-10-20 NOTE — Anesthesia Procedure Notes (Signed)
Date/Time: 10/20/2022 10:23 AM  Performed by: Johnna Acosta, CRNAPre-anesthesia Checklist: Patient identified, Suction available, Emergency Drugs available, Patient being monitored and Timeout performed Patient Re-evaluated:Patient Re-evaluated prior to induction Oxygen Delivery Method: Nasal cannula Preoxygenation: Pre-oxygenation with 100% oxygen Induction Type: IV induction Ventilation: Mask ventilation without difficulty

## 2022-10-20 NOTE — Transfer of Care (Signed)
Immediate Anesthesia Transfer of Care Note  Patient: Yesenia Beasley  Procedure(s) Performed: COLONOSCOPY WITH PROPOFOL  Patient Location: Endoscopy Unit  Anesthesia Type:General  Level of Consciousness: awake, alert , and drowsy  Airway & Oxygen Therapy: Patient Spontanous Breathing  Post-op Assessment: Report given to RN and Post -op Vital signs reviewed and stable  Post vital signs: Reviewed and stable  Last Vitals:  Vitals Value Taken Time  BP    Temp    Pulse 71 10/20/22 1046  Resp 20 10/20/22 1046  SpO2 97 % 10/20/22 1046    Last Pain:  Vitals:   10/20/22 1000  TempSrc: Temporal  PainSc: 0-No pain         Complications: No notable events documented.

## 2022-10-20 NOTE — Anesthesia Preprocedure Evaluation (Addendum)
Anesthesia Evaluation  Patient identified by MRN, date of birth, ID band Patient awake    Reviewed: Allergy & Precautions, H&P , NPO status , Patient's Chart, lab work & pertinent test results  Airway Mallampati: I  TM Distance: >3 FB Neck ROM: full    Dental no notable dental hx.    Pulmonary asthma , Current SmokerPatient did not abstain from smoking.   Pulmonary exam normal        Cardiovascular negative cardio ROS Normal cardiovascular exam     Neuro/Psych  PSYCHIATRIC DISORDERS      negative neurological ROS     GI/Hepatic Neg liver ROS,GERD  Controlled,,  Endo/Other    Morbid obesity  Renal/GU negative Renal ROS  negative genitourinary   Musculoskeletal   Abdominal  (+) + obese  Peds  Hematology negative hematology ROS (+)   Anesthesia Other Findings Past Medical History: No date: Anxiety No date: Asthma 11/09/2017: Genetic testing     Comment:  Multi-Cancer panel (83 genes) @ [facility] - No pathogenic               mutations detected No date: Headache disorder No date: History of kidney stones No date: History of ovarian cyst No date: Migraine No date: Pseudotumor cerebri No date: Urinary tract infection  Past Surgical History: No date: COLONOSCOPY WITH ESOPHAGOGASTRODUODENOSCOPY (EGD) 10/05/2017: COLONOSCOPY WITH PROPOFOL; N/A     Comment:  Procedure: COLONOSCOPY WITH PROPOFOL;  Surgeon: Jonathon Bellows, MD;  Location: [facility] ENDOSCOPY;  Service:               Gastroenterology;  Laterality: N/A; 2002,2003,2014: KNEE SURGERY; Bilateral No date: TONSILLECTOMY  BMI    Body Mass Index: 51.19 kg/m      Reproductive/Obstetrics negative OB ROS                             Anesthesia Physical Anesthesia Plan  ASA: 3  Anesthesia Plan: General   Post-op Pain Management:    Induction: Intravenous  PONV Risk Score and Plan: Propofol infusion and TIVA  Airway  Management Planned: Natural Airway  Additional Equipment:   Intra-op Plan:   Post-operative Plan:   Informed Consent: I have reviewed the patients History and Physical, chart, labs and discussed the procedure including the risks, benefits and alternatives for the proposed anesthesia with the patient or authorized representative who has indicated his/her understanding and acceptance.     Dental Advisory Given  Plan Discussed with: CRNA and Surgeon  Anesthesia Plan Comments:         Anesthesia Quick Evaluation

## 2022-10-20 NOTE — Op Note (Signed)
[facility] Gastroenterology Patient Name: Yesenia Beasley Procedure Date: 10/20/2022 10:14 AM MRN: [id number] Account #: [id number] Date of Birth: 1985-12-13 Admit Type: Outpatient Age: 37 Room: [facility] ENDO ROOM 1 Gender: Female Note Status: Finalized Instrument Name: Park Meo [id number] Procedure:             Colonoscopy Indications:           Surveillance: Personal history of colonic polyps                         (unknown histology) on last colonoscopy 5 years ago Providers:             Jonathon Bellows MD, MD Referring MD:          Jobe Marker. Einar Pheasant (Referring MD) Medicines:             Monitored Anesthesia Care Complications:         No immediate complications. Procedure:             Pre-Anesthesia Assessment:                        - Prior to the procedure, a History and Physical was                         performed, and patient medications, allergies and                         sensitivities were reviewed. The patient's tolerance                         of previous anesthesia was reviewed.                        - The risks and benefits of the procedure and the                         sedation options and risks were discussed with the                         patient. All questions were answered and informed                         consent was obtained.                        - ASA Grade Assessment: II - A patient with mild                         systemic disease.                        After obtaining informed consent, the colonoscope was                         passed under direct vision. Throughout the procedure,                         the patient's blood pressure, pulse, and oxygen  saturations were monitored continuously. The                         Colonoscope was introduced through the anus and                         advanced to the the cecum, identified by the                         appendiceal orifice. The colonoscopy was performed                          with ease. The patient tolerated the procedure well.                         The quality of the bowel preparation was excellent.                         The ileocecal valve, appendiceal orifice, and rectum                         were photographed. Findings:      The perianal and digital rectal examinations were normal.      A 5 mm polyp was found in the sigmoid colon. The polyp was sessile. The       polyp was removed with a cold snare. Resection and retrieval were       complete.      The exam was otherwise without abnormality on direct and retroflexion       views. Impression:            - One 5 mm polyp in the sigmoid colon, removed with a                         cold snare. Resected and retrieved.                        - The examination was otherwise normal on direct and                         retroflexion views. Recommendation:        - Discharge patient to home (with escort).                        - Resume previous diet.                        - Continue present medications.                        - Await pathology results.                        - Repeat colonoscopy in 5 years for surveillance. Procedure Code(s):     --- Professional ---                        [phone], Colonoscopy, flexible; with removal of  tumor(s), polyp(s), or other lesion(s) by snare                         technique Diagnosis Code(s):     --- Professional ---                        Z86.010, Personal history of colonic polyps                        D12.5, Benign neoplasm of sigmoid colon CPT copyright 2022 American Medical Association. All rights reserved. The codes documented in this report are preliminary and upon coder review may  be revised to meet current compliance requirements. Jonathon Bellows, MD Jonathon Bellows MD, MD 10/20/2022 10:42:47 AM This report has been signed electronically. Number of Addenda: 0 Note Initiated On: 10/20/2022 10:14 AM Scope Withdrawal  Time: 0 hours 9 minutes 3 seconds  Total Procedure Duration: 0 hours 11 minutes 51 seconds  Estimated Blood Loss:  Estimated blood loss: none.      [facility]

## 2022-10-20 NOTE — H&P (Signed)
Jonathon Bellows, MD [address], [place], [place], [place], [address] [address], [place], [place], [place], [address] Phone: [phone]  Fax: [phone]  Primary Care Physician:  Waunita Schooner, MD   Pre-Procedure History & Physical: HPI:  Yesenia Beasley is a 37 y.o. female is here for an colonoscopy.   Past Medical History:  Diagnosis Date   Anxiety    Asthma    Genetic testing 11/09/2017   Multi-Cancer panel (83 genes) @ [facility] - No pathogenic mutations detected   Headache disorder    History of kidney stones    History of ovarian cyst    Migraine    Pseudotumor cerebri    Urinary tract infection     Past Surgical History:  Procedure Laterality Date   COLONOSCOPY WITH ESOPHAGOGASTRODUODENOSCOPY (EGD)     COLONOSCOPY WITH PROPOFOL N/A 10/05/2017   Procedure: COLONOSCOPY WITH PROPOFOL;  Surgeon: Jonathon Bellows, MD;  Location: [facility] ENDOSCOPY;  Service: Gastroenterology;  Laterality: N/A;   KNEE SURGERY Bilateral 2002,2003,2014   TONSILLECTOMY      Prior to Admission medications   Medication Sig Start Date End Date Taking? Authorizing Provider  cholecalciferol (VITAMIN D3) 25 MCG (1000 UNIT) tablet Take 1,000 Units by mouth daily.   Yes [provider]  levalbuterol (XOPENEX HFA) 45 MCG/ACT inhaler INHALE 1 TO 2 PUFFS BY MOUTH EVERY 4 HOURS AS NEEDED FOR WHEEZE 01/01/22  Yes Waunita Schooner, MD  ondansetron (ZOFRAN ODT) 4 MG disintegrating tablet Take 1 tablet (4 mg total) by mouth every 8 (eight) hours as needed. 09/18/19  Yes Lavonia Drafts, MD  sertraline (ZOLOFT) 100 MG tablet TAKE 2 TABLETS BY MOUTH EVERY DAY 05/04/20  Yes Elby Beck, FNP  Vitamin D, Ergocalciferol, (DRISDOL) 1.25 MG (50000 UNIT) CAPS capsule Take 1 capsule (50,000 Units total) by mouth every 7 (seven) days. 11/11/21  Yes Waunita Schooner, MD  ALPRAZolam Duanne Moron) 0.25 MG tablet Take 0.25 mg by mouth as needed. Patient not taking: Reported on 10/20/2022 10/24/20   [provider]  Rimegepant  Sulfate 75 MG TBDP Take 1 tablet by mouth as needed. 11/07/19   [provider]  Semaglutide-Weight Management (WEGOVY) 0.5 MG/0.5ML SOAJ Inject 0.5 mg into the skin once a week. Patient not taking: Reported on 10/01/2022 02/13/22   Waunita Schooner, MD    Allergies as of 10/02/2022   (No Known Allergies)    Family History  Problem Relation Age of Onset   Leukemia Mother        CMML; dx 86s; deceased 39   Heart disease Father    Colon polyps Father        initially in 73s; #/type unknown   Panic disorder Father    Heart attack Father    Wilm's tumor Sister        deceased at 15   Brain cancer Maternal Grandfather        astrocytoma; deceased 96   Heart attack Paternal Grandfather 10       deceased at 66   Thyroid cancer Maternal Uncle        dx 77s   Non-Hodgkin's lymphoma Maternal Aunt    Panic disorder Sister     Social History   Socioeconomic History   Marital status: Soil scientist    Spouse name: Not on file   Number of children: 0   Years of education: masters   Highest education level: Not on file  Occupational History   Not on file  Tobacco Use   Smoking  status: Every Day    Packs/day: 0.50    Years: 10.00    Total pack years: 5.00    Types: Cigarettes   Smokeless tobacco: Never  Vaping Use   Vaping Use: Never used  Substance and Sexual Activity   Alcohol use: Never   Drug use: No   Sexual activity: Yes    Birth control/protection: Condom  Other Topics Concern   Not on file  Social History Narrative   11/14/20   From: [place], moved to [facility] 2018   Living: with Jenny Reichmann BF (2015)   Work: [employer], retail - masters in Therapist, 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       Family: dad in [place], has twin sisters in [place] as well      Enjoys: watching sports, dogs - 2 labs, go to the mountains      Exercise: treadmill at home   Diet: planning to work on this soon      Land belts: Yes    Guns: Yes  and secure   Safe in relationships: Yes    Social  Determinants of Radio broadcast assistant Strain: Not on file  Food Insecurity: Not on file  Transportation Needs: Not on file  Physical Activity: Not on file  Stress: Not on file  Social Connections: Not on file  Intimate Partner Violence: Not on file    Review of Systems: See HPI, otherwise negative ROS  Physical Exam: BP (!) 150/94   Pulse 79   Temp (!) 96.4 F (35.8 C) (Temporal)   Resp 18   Ht 5' 5"$  (1.651 m)   Wt (!) 139.5 kg   LMP 10/14/2022 Comment: Pregnancy test positive  SpO2 100%   BMI 51.19 kg/m  General:   Alert,  pleasant and cooperative in NAD Head:  Normocephalic and atraumatic. Neck:  Supple; no masses or thyromegaly. Lungs:  Clear throughout to auscultation, normal respiratory effort.    Heart:  +S1, +S2, Regular rate and rhythm, No edema. Abdomen:  Soft, nontender and nondistended. Normal bowel sounds, without guarding, and without rebound.   Neurologic:  Alert and  oriented x4;  grossly normal neurologically.  Impression/Plan: Yesenia Beasley is here for an colonoscopy to be performed for surveillance due to prior history of colon polyps   Risks, benefits, limitations, and alternatives regarding  colonoscopy have been reviewed with the patient.  Questions have been answered.  All parties agreeable.   Jonathon Bellows, MD  10/20/2022, 10:19 AM

## 2022-10-20 NOTE — Anesthesia Postprocedure Evaluation (Signed)
Anesthesia Post Note  Patient: Yesenia Beasley  Procedure(s) Performed: COLONOSCOPY WITH PROPOFOL  Patient location during evaluation: PACU Anesthesia Type: General Level of consciousness: awake and alert Pain management: pain level controlled Vital Signs Assessment: post-procedure vital signs reviewed and stable Respiratory status: spontaneous breathing, nonlabored ventilation and respiratory function stable Cardiovascular status: blood pressure returned to baseline and stable Postop Assessment: no apparent nausea or vomiting Anesthetic complications: no   No notable events documented.   Last Vitals:  Vitals:   10/20/22 1046 10/20/22 1047  BP: 108/79 108/79  Pulse: 71 71  Resp: 20 20  Temp: (!) 36 C   SpO2: 97% 97%    Last Pain:  Vitals:   10/20/22 1056  TempSrc:   PainSc: 0-No pain                 Iran Ouch

## 2022-10-21 ENCOUNTER — Encounter: Payer: Self-pay | Admitting: Gastroenterology

## 2022-10-21 DIAGNOSIS — F411 Generalized anxiety disorder: Secondary | ICD-10-CM | POA: Diagnosis not present

## 2022-10-21 DIAGNOSIS — F410 Panic disorder [episodic paroxysmal anxiety] without agoraphobia: Secondary | ICD-10-CM | POA: Diagnosis not present

## 2022-10-21 LAB — SURGICAL PATHOLOGY

## 2022-10-23 ENCOUNTER — Encounter: Payer: Self-pay | Admitting: Gastroenterology

## 2022-11-03 DIAGNOSIS — D518 Other vitamin B12 deficiency anemias: Secondary | ICD-10-CM | POA: Diagnosis not present

## 2022-11-03 DIAGNOSIS — F411 Generalized anxiety disorder: Secondary | ICD-10-CM | POA: Diagnosis not present

## 2022-11-03 DIAGNOSIS — F410 Panic disorder [episodic paroxysmal anxiety] without agoraphobia: Secondary | ICD-10-CM | POA: Diagnosis not present

## 2022-11-05 DIAGNOSIS — F411 Generalized anxiety disorder: Secondary | ICD-10-CM | POA: Diagnosis not present

## 2022-11-05 DIAGNOSIS — F410 Panic disorder [episodic paroxysmal anxiety] without agoraphobia: Secondary | ICD-10-CM | POA: Diagnosis not present

## 2022-11-18 ENCOUNTER — Ambulatory Visit (INDEPENDENT_AMBULATORY_CARE_PROVIDER_SITE_OTHER): Payer: BC Managed Care – PPO | Admitting: Obstetrics and Gynecology

## 2022-11-18 ENCOUNTER — Other Ambulatory Visit (HOSPITAL_COMMUNITY)
Admission: RE | Admit: 2022-11-18 | Discharge: 2022-11-18 | Disposition: A | Discharge status: Home / Self Care | Payer: BC Managed Care – PPO | Source: Ambulatory Visit | Attending: Obstetrics and Gynecology | Admitting: Obstetrics and Gynecology

## 2022-11-18 ENCOUNTER — Encounter: Payer: Self-pay | Admitting: Obstetrics and Gynecology

## 2022-11-18 VITALS — BP 134/84 | HR 75 | Ht 65.0 in | Wt 307.0 lb

## 2022-11-18 DIAGNOSIS — N9089 Other specified noninflammatory disorders of vulva and perineum: Secondary | ICD-10-CM

## 2022-11-18 DIAGNOSIS — Z01419 Encounter for gynecological examination (general) (routine) without abnormal findings: Secondary | ICD-10-CM | POA: Diagnosis not present

## 2022-11-18 DIAGNOSIS — N946 Dysmenorrhea, unspecified: Secondary | ICD-10-CM

## 2022-11-18 DIAGNOSIS — N920 Excessive and frequent menstruation with regular cycle: Secondary | ICD-10-CM | POA: Diagnosis not present

## 2022-11-18 MED ORDER — SLYND 4 MG PO TABS: 1.0000 | ORAL_TABLET | Freq: Every day | ORAL | 3 refills | Status: DC

## 2022-11-18 NOTE — Progress Notes (Unsigned)
Obstetrics and Gynecology New Patient Evaluation  Appointment Date: 11/18/2022  OBGYN Clinic: [facility]  Primary Care Provider: Waunita Schooner  Chief Complaint: pain with ovulation; pain and heavy periods   History of Present Illness: Yesenia Beasley is a 37 y.o.  G0 (Patient's last menstrual period was 10/13/2022.), seen for the above chief complaint. Her past medical history is significant for BMI 51, h/o migraines, h/o pseudotumor   Labia bumps: gets them on and off; was told in the past they were benign Ovulation pain: chronic issue, see below. She has not been on anything for this or period control except mirena in the past.  Heavy and painful periods: bleeding is about 5-7 days. Pain for about 7-10d  Review of Systems: Pertinent items noted in HPI and remainder of comprehensive ROS otherwise negative.   Patient Active Problem List   Diagnosis Date Noted   Adenomatous polyp of colon 10/20/2022   Facial flushing 11/11/2021   Genital lesion, female 11/11/2021   Neck swelling 11/11/2021   Prediabetes 11/11/2021   Weight gain 05/20/2021   Vitamin D deficiency 05/20/2021   Vitamin B12 deficiency 05/20/2021   Abnormal MRI 05/20/2021   Asthma 11/14/2020   Migraine 11/14/2020   Family history of heart disease in female family member before age 43 11/14/2020   Mixed hyperlipidemia 11/14/2020   Elevated LDL cholesterol level 07/23/2019   Morbid obesity ([place]) 09/25/2018   Gastroesophageal reflux disease 01/06/2018   Genetic testing 11/09/2017   Dysmenorrhea 06/25/2017   Panic disorder 06/25/2017   Pseudotumor cerebri 06/25/2017   History of colon polyps 06/25/2017   Depression, recurrent ([place]) 06/25/2017   Anxiety 06/15/2017    Past Medical History:  Past Medical History:  Diagnosis Date   Anxiety    Asthma    Genetic testing 11/09/2017   Multi-Cancer panel (83 genes) @ [facility] - No pathogenic mutations detected   Headache disorder    History  of kidney stones    History of ovarian cyst    Migraine    Pseudotumor cerebri    Urinary tract infection     Past Surgical History:  Past Surgical History:  Procedure Laterality Date   COLONOSCOPY WITH ESOPHAGOGASTRODUODENOSCOPY (EGD)     COLONOSCOPY WITH PROPOFOL N/A 10/05/2017   Procedure: COLONOSCOPY WITH PROPOFOL;  Surgeon: Jonathon Bellows, MD;  Location: [facility] ENDOSCOPY;  Service: Gastroenterology;  Laterality: N/A;   COLONOSCOPY WITH PROPOFOL N/A 10/20/2022   Procedure: COLONOSCOPY WITH PROPOFOL;  Surgeon: Jonathon Bellows, MD;  Location: [facility] ENDOSCOPY;  Service: Gastroenterology;  Laterality: N/A;   KNEE SURGERY Bilateral 2002,2003,2014   TONSILLECTOMY      Past Obstetrical History:  OB History  Gravida Para Term Preterm AB Living  0 0 0 0 0 0  SAB IAB Ectopic Multiple Live Births  0 0 0 0 0    Past Gynecological History: As per HPI. History of Pap Smear(s): Yes.   Last pap 2020, which was wnl  Social History:  Social History   Socioeconomic History   Marital status: Soil scientist    Spouse name: Not on file   Number of children: 0   Years of education: masters   Highest education level: Not on file  Occupational History   Not on file  Tobacco Use   Smoking status: Every Day    Packs/day: 0.50    Years: 10.00    Total pack years: 5.00    Types: Cigarettes   Smokeless tobacco: Never  Vaping Use   Vaping  Use: Never used  Substance and Sexual Activity   Alcohol use: Yes    Comment: socially   Drug use: No   Sexual activity: Yes    Partners: Male    Birth control/protection: Condom  Other Topics Concern   Not on file  Social History Narrative   11/14/20   From: [place], moved to [facility] 2018   Living: with Jenny Reichmann BF (2015)   Work: [employer] - masters in Therapist, 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       Family: dad in [place], has twin sisters in [place] as well      Enjoys: watching sports, dogs - 2 labs, go to the mountains      Exercise: treadmill at home    Diet: planning to work on this soon      Land belts: Yes    Guns: Yes  and secure   Safe in relationships: Yes    Social Determinants of Radio broadcast assistant Strain: Not on file  Food Insecurity: Not on file  Transportation Needs: Not on file  Physical Activity: Not on file  Stress: Not on file  Social Connections: Not on file  Intimate Partner Violence: Not on file    Family History:  Family History  Problem Relation Age of Onset   Leukemia Mother        CMML; dx 32s; deceased 39   Heart disease Father    Colon polyps Father        initially in 29s; #/type unknown   Panic disorder Father    Heart attack Father    Wilm's tumor Sister        deceased at 92   Brain cancer Maternal Grandfather        astrocytoma; deceased 24   Heart attack Paternal Grandfather 63       deceased at 21   Thyroid cancer Maternal Uncle        dx 49s   Non-Hodgkin's lymphoma Maternal Aunt    Panic disorder Sister     Medications Sharline Schomburg had no medications administered during this visit. Current Outpatient Medications  Medication Sig Dispense Refill   cholecalciferol (VITAMIN D3) 25 MCG (1000 UNIT) tablet Take 1,000 Units by mouth daily.     Cyanocobalamin (VITAMIN B-12) 2500 MCG SUBL Place under the tongue.     levalbuterol (XOPENEX HFA) 45 MCG/ACT inhaler INHALE 1 TO 2 PUFFS BY MOUTH EVERY 4 HOURS AS NEEDED FOR WHEEZE 15 g 12   ondansetron (ZOFRAN ODT) 4 MG disintegrating tablet Take 1 tablet (4 mg total) by mouth every 8 (eight) hours as needed. 20 tablet 0   Rimegepant Sulfate 75 MG TBDP Take 1 tablet by mouth as needed.     sertraline (ZOLOFT) 100 MG tablet TAKE 2 TABLETS BY MOUTH EVERY DAY 180 tablet 0   Vitamin D, Ergocalciferol, (DRISDOL) 1.25 MG (50000 UNIT) CAPS capsule Take 1 capsule (50,000 Units total) by mouth every 7 (seven) days. 4 capsule 1   ALPRAZolam (XANAX) 0.25 MG tablet Take 0.25 mg by mouth as needed. (Patient not taking: Reported on 10/20/2022)      Semaglutide-Weight Management (WEGOVY) 0.5 MG/0.5ML SOAJ Inject 0.5 mg into the skin once a week. (Patient not taking: Reported on 10/01/2022) 2 mL 0   No current facility-administered medications for this visit.    Allergies Patient has no known allergies.   Physical Exam:  BP 134/84   Pulse 75   Ht 5'  5" (1.651 m)   Wt (!) 307 lb (139.3 kg)   LMP 10/13/2022 Comment: Pregnancy test positive  BMI 51.09 kg/m  Body mass index is 51.09 kg/m. General appearance: Well nourished, well developed female in no acute distress.  Neck:  Supple, normal appearance, and no thyromegaly  Cardiovascular: normal s1 and s2.  No murmurs, rubs or gallops. Respiratory:  Clear to auscultation bilateral. Normal respiratory effort Abdomen: positive bowel sounds and no masses, hernias; diffusely non tender to palpation, non distended Breasts: breasts appear normal, no suspicious masses, no skin or nipple changes or axillary nodes, and normal palpation. Neuro/Psych:  Normal mood and affect.  Skin:  Warm and dry.  Lymphatic:  No inguinal lymphadenopathy.   Cervical exam performed in the presence of a chaperone Pelvic exam: is limited by body habitus EGBUS: within normal limits. Two pinpoint sebaceous cyst one on each labia, nttp, mobile Vagina: within normal limits and with no blood or discharge in the vault Cervix: normal appearing cervix without tenderness, discharge or lesions. Uterus:  nonenlarged and non tender Adnexa:  normal adnexa and no mass, fullness, tenderness Rectovaginal: deferred  Laboratory: none  Radiology: no new imaging Narrative & Impression  CLINICAL DATA:  Left lower quadrant pain for 2 days   EXAM: TRANSABDOMINAL AND TRANSVAGINAL ULTRASOUND OF PELVIS   DOPPLER ULTRASOUND OF OVARIES   TECHNIQUE: Both transabdominal and transvaginal ultrasound examinations of the pelvis were performed. Transabdominal technique was performed for global imaging of the pelvis including uterus,  ovaries, adnexal regions, and pelvic cul-de-sac.   It was necessary to proceed with endovaginal exam following the transabdominal exam to visualize the uterus, endometrium and ovaries. Color and duplex Doppler ultrasound was utilized to evaluate blood flow to the ovaries.   COMPARISON:  06/21/2018   FINDINGS: Uterus   Measurements: 7.1 x 3.1 x 4.0 cm = volume: 46 mL. No fibroids or other mass visualized.   Endometrium   Thickness: 5.7 mm.  No focal abnormality visualized.   Right ovary   Measurements: 2.5 x 1.3 x 1.9 cm = volume: 3.3 mL. Normal appearance/no adnexal mass.   Left ovary   Measurements: 3.6 x 2.4 x 2.4 cm = volume: 10.0 mL. Contains a small partially collapsed cyst or corpus luteum.   Pulsed Doppler evaluation of both ovaries demonstrates normal low-resistance arterial and venous waveforms.   Other findings   No abnormal free fluid.   IMPRESSION: 1. No acute findings.  No evidence for ovarian mass or torsion. 2. Physiologic cyst noted within the left ovary.     Electronically Signed   By: Kerby Moors M.D.   On: 01/19/2021 05:55    Assessment: patient stable  Plan:  1. Well woman exam with routine gynecological exam - Cytology - PAP( [place])  2. Lesion of labia Benign sebaceous cyst d/w her  3. Dysmenorrhea Prior ultrasound reviewed and unremarkable. Many options and scenarios d/w her. I told her that, ideally, an option that suppresses ovulation is best, which would not include a hysterectomy since leaving her ovaries is recommended given her age; pt does not desire to ever have children. She is working on this, but I also told her that weight loss would also help a great deal. Given her co-morbidities, I recommend progestin only pills, specifically Slynd since this will suppress her ovulation, vs older POPs which may not consistently do this. Patient is amenable to this  4. Menorrhagia with regular cycle   RTC 3 month.   No  follow-ups on  file.  Future Appointments  Date Time Provider [place]  01/16/2023  9:20 AM Eugenia Pancoast, FNP [facility]    Durene Romans MD Attending [facility])

## 2022-11-18 NOTE — Progress Notes (Signed)
Patient presents for Annual.  LMP: 10/13/22 Monthly, with Moderate flow Last pap:  07/22/2019 WNL  Hx of HPV Contraception: None Mammogram:  01/27/2020 and  U/S  STD Screening: Declines Flu Vaccine : Received already  CC:  tiny bumps that come and go, not painful to touch.   Fun Fact: Loves dogs.

## 2022-11-21 LAB — CYTOLOGY - PAP
Comment: NEGATIVE
Diagnosis: NEGATIVE
High risk HPV: NEGATIVE

## 2022-11-25 ENCOUNTER — Other Ambulatory Visit: Payer: Self-pay | Admitting: *Deleted

## 2022-11-25 ENCOUNTER — Encounter: Payer: Self-pay | Admitting: Obstetrics and Gynecology

## 2022-11-25 MED ORDER — SLYND 4 MG PO TABS: 1.0000 | ORAL_TABLET | Freq: Every day | ORAL | 3 refills | Status: DC

## 2022-11-25 NOTE — Progress Notes (Signed)
Programmer, multimedia

## 2022-11-26 ENCOUNTER — Encounter: Payer: Self-pay | Admitting: *Deleted

## 2022-11-26 ENCOUNTER — Other Ambulatory Visit: Payer: Self-pay | Admitting: Primary Care

## 2022-11-26 DIAGNOSIS — R7303 Prediabetes: Secondary | ICD-10-CM

## 2022-11-26 DIAGNOSIS — E782 Mixed hyperlipidemia: Secondary | ICD-10-CM

## 2022-11-27 DIAGNOSIS — F410 Panic disorder [episodic paroxysmal anxiety] without agoraphobia: Secondary | ICD-10-CM | POA: Diagnosis not present

## 2022-11-27 DIAGNOSIS — F411 Generalized anxiety disorder: Secondary | ICD-10-CM | POA: Diagnosis not present

## 2022-12-01 ENCOUNTER — Encounter: Payer: BC Managed Care – PPO | Admitting: Obstetrics and Gynecology

## 2022-12-12 DIAGNOSIS — F411 Generalized anxiety disorder: Secondary | ICD-10-CM | POA: Diagnosis not present

## 2022-12-12 DIAGNOSIS — F410 Panic disorder [episodic paroxysmal anxiety] without agoraphobia: Secondary | ICD-10-CM | POA: Diagnosis not present

## 2022-12-23 ENCOUNTER — Encounter: Payer: BC Managed Care – PPO | Admitting: Family

## 2022-12-29 DIAGNOSIS — F411 Generalized anxiety disorder: Secondary | ICD-10-CM | POA: Diagnosis not present

## 2022-12-29 DIAGNOSIS — F410 Panic disorder [episodic paroxysmal anxiety] without agoraphobia: Secondary | ICD-10-CM | POA: Diagnosis not present

## 2023-01-05 ENCOUNTER — Encounter: Payer: Self-pay | Admitting: Family

## 2023-01-05 ENCOUNTER — Other Ambulatory Visit: Payer: Self-pay | Admitting: Family

## 2023-01-05 DIAGNOSIS — J4520 Mild intermittent asthma, uncomplicated: Secondary | ICD-10-CM

## 2023-01-05 MED ORDER — ALBUTEROL SULFATE HFA 108 (90 BASE) MCG/ACT IN AERS: 2.0000 | INHALATION_SPRAY | Freq: Four times a day (QID) | RESPIRATORY_TRACT | 0 refills | Status: DC | PRN

## 2023-01-09 ENCOUNTER — Telehealth: Payer: Self-pay

## 2023-01-09 DIAGNOSIS — F411 Generalized anxiety disorder: Secondary | ICD-10-CM | POA: Diagnosis not present

## 2023-01-09 DIAGNOSIS — F410 Panic disorder [episodic paroxysmal anxiety] without agoraphobia: Secondary | ICD-10-CM | POA: Diagnosis not present

## 2023-01-09 NOTE — Telephone Encounter (Signed)
Duplicate rx request. Error in Message.

## 2023-01-16 ENCOUNTER — Ambulatory Visit (INDEPENDENT_AMBULATORY_CARE_PROVIDER_SITE_OTHER): Payer: BC Managed Care – PPO | Admitting: Family

## 2023-01-16 ENCOUNTER — Encounter: Payer: Self-pay | Admitting: Family

## 2023-01-16 VITALS — BP 130/78 | HR 80 | Temp 97.9°F | Ht 65.0 in | Wt 311.4 lb

## 2023-01-16 DIAGNOSIS — J4520 Mild intermittent asthma, uncomplicated: Secondary | ICD-10-CM

## 2023-01-16 DIAGNOSIS — M2550 Pain in unspecified joint: Secondary | ICD-10-CM

## 2023-01-16 DIAGNOSIS — Z860100 Personal history of colon polyps, unspecified: Secondary | ICD-10-CM

## 2023-01-16 DIAGNOSIS — R7303 Prediabetes: Secondary | ICD-10-CM | POA: Diagnosis not present

## 2023-01-16 DIAGNOSIS — E7800 Pure hypercholesterolemia, unspecified: Secondary | ICD-10-CM

## 2023-01-16 DIAGNOSIS — Z8249 Family history of ischemic heart disease and other diseases of the circulatory system: Secondary | ICD-10-CM

## 2023-01-16 DIAGNOSIS — R062 Wheezing: Secondary | ICD-10-CM

## 2023-01-16 DIAGNOSIS — J4541 Moderate persistent asthma with (acute) exacerbation: Secondary | ICD-10-CM | POA: Diagnosis not present

## 2023-01-16 DIAGNOSIS — N946 Dysmenorrhea, unspecified: Secondary | ICD-10-CM

## 2023-01-16 DIAGNOSIS — F410 Panic disorder [episodic paroxysmal anxiety] without agoraphobia: Secondary | ICD-10-CM

## 2023-01-16 DIAGNOSIS — E559 Vitamin D deficiency, unspecified: Secondary | ICD-10-CM

## 2023-01-16 DIAGNOSIS — Z8349 Family history of other endocrine, nutritional and metabolic diseases: Secondary | ICD-10-CM

## 2023-01-16 DIAGNOSIS — R768 Other specified abnormal immunological findings in serum: Secondary | ICD-10-CM

## 2023-01-16 DIAGNOSIS — F339 Major depressive disorder, recurrent, unspecified: Secondary | ICD-10-CM

## 2023-01-16 DIAGNOSIS — F419 Anxiety disorder, unspecified: Secondary | ICD-10-CM

## 2023-01-16 DIAGNOSIS — Z23 Encounter for immunization: Secondary | ICD-10-CM

## 2023-01-16 DIAGNOSIS — E538 Deficiency of other specified B group vitamins: Secondary | ICD-10-CM

## 2023-01-16 DIAGNOSIS — Z720 Tobacco use: Secondary | ICD-10-CM

## 2023-01-16 DIAGNOSIS — G43809 Other migraine, not intractable, without status migrainosus: Secondary | ICD-10-CM

## 2023-01-16 DIAGNOSIS — Z30011 Encounter for initial prescription of contraceptive pills: Secondary | ICD-10-CM

## 2023-01-16 MED ORDER — ZEPBOUND 2.5 MG/0.5ML ~~LOC~~ SOAJ: 2.5000 mg | SUBCUTANEOUS | 0 refills | Status: DC

## 2023-01-16 MED ORDER — MONTELUKAST SODIUM 10 MG PO TABS
10.0000 mg | ORAL_TABLET | Freq: Every day | ORAL | 3 refills | Status: AC
Start: 2023-01-16 — End: ?

## 2023-01-16 MED ORDER — SLYND 4 MG PO TABS: 1.0000 | ORAL_TABLET | Freq: Every day | ORAL | 3 refills | Status: DC

## 2023-01-16 MED ORDER — ZEPBOUND 5 MG/0.5ML ~~LOC~~ SOAJ: 5.0000 mg | SUBCUTANEOUS | 0 refills | Status: DC

## 2023-01-16 MED ORDER — SERTRALINE HCL 100 MG PO TABS
ORAL_TABLET | ORAL | 0 refills | Status: AC
Start: 2023-01-16 — End: ?

## 2023-01-16 NOTE — Assessment & Plan Note (Signed)
Stable on ocp  Refill slynd

## 2023-01-16 NOTE — Assessment & Plan Note (Signed)
Continue f/u with psychiatrist

## 2023-01-16 NOTE — Patient Instructions (Addendum)
  Stop by the lab prior to leaving today. I will notify you of your results once received.   Recommendations on keeping yourself healthy:  - Exercise at least 30-45 minutes a day, 3-4 days a week.  - Eat a low-fat diet with lots of fruits and vegetables, up to 7-9 servings per day.  - Seatbelts can save your life. Wear them always.  - Smoke detectors on every level of your home, check batteries every year.  - Eye Doctor - have an eye exam every 1-2 years  - Safe sex - if you may be exposed to STDs, use a condom.  - Alcohol -  If you drink, do it moderately, less than 2 drinks per day.  - Health Care Power of Attorney. Choose someone to speak for you if you are not able.  - Depression is common in our stressful world.If you're feeling down or losing interest in things you normally enjoy, please come in for a visit.  - Violence - If anyone is threatening or hurting you, please call immediately.  Due to recent changes in healthcare laws, you may see results of your imaging and/or laboratory studies on MyChart before I have had a chance to review them.  I understand that in some cases there may be results that are confusing or concerning to you. Please understand that not all results are received at the same time and often I may need to interpret multiple results in order to provide you with the best plan of care or course of treatment. Therefore, I ask that you please give me 2 business days to thoroughly review all your results before contacting my office for clarification. Should we see a critical lab result, you will be contacted sooner.   I will see you again in one year for your annual comprehensive exam unless otherwise stated and or with acute concerns.  It was a pleasure seeing you today! Please do not hesitate to reach out with any questions and or concerns.  Regards,   Warnie Belair    

## 2023-01-16 NOTE — Assessment & Plan Note (Signed)
Start singulair 10 mg  Worsening  Cxr today to r/o pneumonia/bronchitis as wheezing

## 2023-01-16 NOTE — Progress Notes (Unsigned)
Established Patient Office Visit  Subjective:  Patient ID: Yesenia Beasley, female    DOB: 1986-07-27  Age: 37 y.o. MRN: [id number]  CC:  Chief Complaint  Patient presents with   Establish Care    TOC from Dr Selena Batten    HPI [name] is here for a transition of care visit.  Prior provider was: Dr. Gweneth Dimitri   Pt is with acute concerns.  Does note asthma is acting up with the allergy season. She is often wheezing. She does still smoke as well, desire to quit but not quite ready. Does plan to start nicotine patches sometime in the near future.   chronic concerns:  Pseudotumor cerebri: sees Dr. Clelia Croft, no medication necessary. Stable. Nurtec prn   Obesity: was on wegovy however on back order so she had to d/c. She would like to try either wegovy or another weight loss medication.  She is trying to work on exercise but does walk her dog often.   C/o polyarthralgia and stiffness. Bil knees, bil shoulders, and bil elbows and wrists.she was tested in the past wit ha positive RF factor. She never f/u on this. Uncle with rheumatoid arthritis.   Anxiety with panic attacks: on sertraline 150 mg once daily and seeing psychiatrist (Dr. Maryruth Bun) and also seeing therapist Perley Jain) sees her twice a month.   Past Medical History:  Diagnosis Date   Anxiety    Asthma    Genetic testing 11/09/2017   Multi-Cancer panel (83 genes) @ [facility] - No pathogenic mutations detected   Headache disorder    History of kidney stones    History of ovarian cyst    Migraine    Pseudotumor cerebri    Urinary tract infection     Past Surgical History:  Procedure Laterality Date   COLONOSCOPY WITH ESOPHAGOGASTRODUODENOSCOPY (EGD)     COLONOSCOPY WITH PROPOFOL N/A 10/05/2017   Procedure: COLONOSCOPY WITH PROPOFOL;  Surgeon: Wyline Mood, MD;  Location: [facility] ENDOSCOPY;  Service: Gastroenterology;  Laterality: N/A;   COLONOSCOPY WITH PROPOFOL N/A 10/20/2022   Procedure: COLONOSCOPY WITH PROPOFOL;  Surgeon:  Wyline Mood, MD;  Location: [facility] ENDOSCOPY;  Service: Gastroenterology;  Laterality: N/A;   KNEE SURGERY Bilateral 2002,2003,2014   TONSILLECTOMY      Family History  Problem Relation Age of Onset   Leukemia Mother        CMML; dx 83s; deceased 30   Heart disease Father    Colon polyps Father        initially in 50s; #/type unknown   Panic disorder Father    Heart attack Father        49   Wilm's tumor Sister        deceased at 2   Panic disorder Sister    Brain cancer Maternal Grandfather        astrocytoma; deceased 39   Heart attack Paternal Grandfather 65       deceased at 50   Non-Hodgkin's lymphoma Maternal Aunt    Thyroid cancer Maternal Uncle        dx 71s   Rheum arthritis Maternal Uncle     Social History   Socioeconomic History   Marital status: Significant Other    Spouse name: Not on file   Number of children: 0   Years of education: masters   Highest education level: Not on file  Occupational History    Employer: [employer]   Tobacco Use   Smoking status: Every Day  Packs/day: 0.50    Years: 10.00    Additional pack years: 0.00    Total pack years: 5.00    Types: Cigarettes   Smokeless tobacco: Never  Vaping Use   Vaping Use: Never used  Substance and Sexual Activity   Alcohol use: Yes    Comment: socially   Drug use: No   Sexual activity: Yes    Partners: Male    Birth control/protection: OCP  Other Topics Concern   Not on file  Social History Narrative   11/14/20   From: [place], moved to [facility] 2018   Living: with Jonny Ruiz BF (2015)   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       Family: dad in [place], has twin sisters in [place] as well      Enjoys: watching sports, dogs - 2 labs, go to the mountains      Exercise: treadmill at home   Diet: planning to work on this soon      IT sales professional belts: Yes    Guns: Yes  and secure   Safe in relationships: Yes    No children by choice    Lives with s/o    Social Determinants of Corporate investment banker Strain: Not on file  Food Insecurity: Not on file  Transportation Needs: Not on file  Physical Activity: Not on file  Stress: Not on file  Social Connections: Not on file  Intimate Partner Violence: Not on file    Outpatient Medications Prior to Visit  Medication Sig Dispense Refill   ALPRAZolam (XANAX) 0.25 MG tablet Take 0.25 mg by mouth as needed.     cholecalciferol (VITAMIN D3) 25 MCG (1000 UNIT) tablet Take 1,000 Units by mouth daily.     Cyanocobalamin (VITAMIN B-12) 2500 MCG SUBL Place under the tongue.     levalbuterol (XOPENEX HFA) 45 MCG/ACT inhaler Inhale 2 puffs into the lungs every 6 (six) hours as needed for wheezing. 1 each 1   ondansetron (ZOFRAN ODT) 4 MG disintegrating tablet Take 1 tablet (4 mg total) by mouth every 8 (eight) hours as needed. 20 tablet 0   Rimegepant Sulfate 75 MG TBDP Take 1 tablet by mouth as needed.     Drospirenone (SLYND) 4 MG TABS Take 1 tablet (4 mg total) by mouth daily. 90 tablet 3   sertraline (ZOLOFT) 100 MG tablet TAKE 2 TABLETS BY MOUTH EVERY DAY 180 tablet 0   Vitamin D, Ergocalciferol, (DRISDOL) 1.25 MG (50000 UNIT) CAPS capsule Take 1 capsule (50,000 Units total) by mouth every 7 (seven) days. 4 capsule 1   Semaglutide-Weight Management (WEGOVY) 0.5 MG/0.5ML SOAJ Inject 0.5 mg into the skin once a week. (Patient not taking: Reported on 10/01/2022) 2 mL 0   No facility-administered medications prior to visit.    Allergies  Allergen Reactions   Wellbutrin [Bupropion] Other (See Comments)    agitated    ROS: Pertinent symptoms negative unless otherwise noted in HPI     Objective:    Physical Exam Vitals reviewed.  Constitutional:      General: She is not in acute distress.    Appearance: Normal appearance. She is obese. She is not ill-appearing or toxic-appearing.  HENT:     Right Ear: Tympanic membrane normal.     Left Ear: Tympanic membrane normal.      Mouth/Throat:     Mouth: Mucous membranes are moist.     Pharynx: No  pharyngeal swelling.     Tonsils: No tonsillar exudate.  Eyes:     Extraocular Movements: Extraocular movements intact.     Conjunctiva/sclera: Conjunctivae normal.     Pupils: Pupils are equal, round, and reactive to light.  Neck:     Thyroid: No thyroid mass.  Cardiovascular:     Rate and Rhythm: Normal rate and regular rhythm.  Pulmonary:     Effort: Pulmonary effort is normal.     Breath sounds: Normal breath sounds.     Comments: Slight wheezing in ruq  Abdominal:     General: Abdomen is flat. Bowel sounds are normal.     Palpations: Abdomen is soft.  Musculoskeletal:        General: Normal range of motion.  Lymphadenopathy:     Cervical:     Right cervical: No superficial cervical adenopathy.    Left cervical: No superficial cervical adenopathy.  Skin:    General: Skin is warm.     Capillary Refill: Capillary refill takes less than 2 seconds.  Neurological:     General: No focal deficit present.     Mental Status: She is alert and oriented to person, place, and time.  Psychiatric:        Mood and Affect: Mood normal.        Behavior: Behavior normal.        Thought Content: Thought content normal.        Judgment: Judgment normal.      BP 130/78 (BP Location: Left Arm)   Pulse 80   Temp 97.9 F (36.6 C) (Temporal)   Ht 5\' 5"  (1.651 m)   Wt (!) 311 lb 6.4 oz (141.3 kg)   SpO2 98%   BMI 51.82 kg/m  Wt Readings from Last 3 Encounters:  01/16/23 (!) 311 lb 6.4 oz (141.3 kg)  11/18/22 (!) 307 lb (139.3 kg)  10/20/22 (!) 307 lb 10.1 oz (139.5 kg)     There are no preventive care reminders to display for this patient.   There are no preventive care reminders to display for this patient.  Lab Results  Component Value Date   TSH 2.52 11/11/2021   Lab Results  Component Value Date   WBC 9.8 11/11/2021   HGB 14.4 11/11/2021   HCT 43.0 11/11/2021   MCV 87.1 11/11/2021   PLT 186.0  11/11/2021   Lab Results  Component Value Date   NA 137 11/11/2021   K 3.9 11/11/2021   CO2 24 11/11/2021   GLUCOSE 107 (H) 11/11/2021   BUN 9 11/11/2021   CREATININE 0.76 11/11/2021   BILITOT 0.5 11/11/2021   ALKPHOS 71 11/11/2021   AST 9 11/11/2021   ALT 11 11/11/2021   PROT 6.5 11/11/2021   ALBUMIN 4.2 11/11/2021   CALCIUM 9.0 11/11/2021   ANIONGAP 7 01/19/2021   GFR 100.01 11/11/2021   Lab Results  Component Value Date   CHOL 168 05/20/2021   Lab Results  Component Value Date   HDL 23.70 (L) 05/20/2021   Lab Results  Component Value Date   LDLCALC 108 (H) 05/20/2021   Lab Results  Component Value Date   TRIG 181.0 (H) 05/20/2021   Lab Results  Component Value Date   CHOLHDL 7 05/20/2021   Lab Results  Component Value Date   HGBA1C 5.7 11/11/2021      Assessment & Plan:   Prediabetes Assessment & Plan: A1c ordered  Pending results.  Pt advised of the following: Work on a diabetic  diet, try to incorporate exercise at least 20-30 a day for 3 days a week or more.    Orders: -     Basic metabolic panel; Future -     Hemoglobin A1c; Future  Morbid obesity (HCC) Assessment & Plan: Pt advised to work on diet and exercise as tolerated   Orders: -     Ambulatory referral to Cardiology -     TSH; Future  Elevated LDL cholesterol level Assessment & Plan: Ordered lipid panel, pending results. Work on low cholesterol diet and exercise as tolerated   Orders: -     Ambulatory referral to Cardiology -     Lipid panel; Future  Vitamin B12 deficiency Assessment & Plan: Ordered b12 pending results   Orders: -     B12 and Folate Panel; Future -     CBC; Future -     Homocysteine; Future  Vitamin D deficiency -     VITAMIN D 25 Hydroxy (Vit-D Deficiency, Fractures); Future  Anxiety Assessment & Plan: Continue f/u with psychiatrist    Other migraine without status migrainosus, not intractable  Encounter for initial prescription of  contraceptive pills -     Slynd; Take 1 tablet (4 mg total) by mouth daily.  Dispense: 90 tablet; Refill: 3  Family history of heart disease in female family member before age 14 -     Ambulatory referral to Cardiology  Polyarthralgia Assessment & Plan: Workup in place today for autoimmune disease.  H/o positive RF and she states never went for workup, will repeat ANA RF sed rate crp pending results  Orders: -     ANA; Future -     C-reactive protein; Future -     Rheumatoid factor; Future -     Sedimentation rate; Future  Tobacco abuse Assessment & Plan: Smoking cessation instruction/counseling given:  counseled patient on the dangers of tobacco use, advised patient to stop smoking, and reviewed strategies to maximize success   Orders: -     DG Chest 2 View; Future  Family history of thyroid disease -     TSH; Future  Rheumatoid factor positive -     ANA; Future -     Rheumatoid factor; Future  Panic disorder -     Sertraline HCl; Take 1.5 tablets once daily  Dispense: 180 tablet; Refill: 0  Depression, recurrent (HCC) Assessment & Plan: Continue sertraline 150 mg once daily.  Doing well, stable.   Orders: -     Sertraline HCl; Take 1.5 tablets once daily  Dispense: 180 tablet; Refill: 0  Wheezing Assessment & Plan: Suspected asthma exacerbation Cxr ordered pending results.  Start singulair 10 mg nightly   Orders: -     DG Chest 2 View; Future  Moderate persistent extrinsic asthma with acute exacerbation -     Montelukast Sodium; Take 1 tablet (10 mg total) by mouth at bedtime.  Dispense: 90 tablet; Refill: 3  History of colon polyps  Need for Td vaccine -     Td vaccine greater than or equal to 7yo preservative free IM  Mild intermittent asthma without complication Assessment & Plan: Start singulair 10 mg  Worsening  Cxr today to r/o pneumonia/bronchitis as wheezing   Dysmenorrhea Assessment & Plan: Stable on ocp  Refill slynd     Meds  ordered this encounter  Medications   Drospirenone (SLYND) 4 MG TABS    Sig: Take 1 tablet (4 mg total) by mouth daily.    Dispense:  90 tablet    Refill:  3    Order Specific Question:   Supervising Provider    Answer:   BEDSOLE, AMY E [[id number]]   DISCONTD: tirzepatide (ZEPBOUND) 5 MG/0.5ML Pen    Sig: Inject 5 mg into the skin once a week. After four weeks of 2.5 mg weekly increase to 5 mg once weekly    Dispense:  2 mL    Refill:  0    Order Specific Question:   Supervising Provider    Answer:   BEDSOLE, AMY E [[id number]]   DISCONTD: tirzepatide (ZEPBOUND) 2.5 MG/0.5ML Pen    Sig: Inject 2.5 mg into the skin once a week. Once complete with four weeks of weekly injection will increase to 5 mg Qweek with new RX    Dispense:  2 mL    Refill:  0    Order Specific Question:   Supervising Provider    Answer:   BEDSOLE, AMY E [[id number]]   sertraline (ZOLOFT) 100 MG tablet    Sig: Take 1.5 tablets once daily    Dispense:  180 tablet    Refill:  0    Order Specific Question:   Supervising Provider    Answer:   BEDSOLE, AMY E [[id number]]   montelukast (SINGULAIR) 10 MG tablet    Sig: Take 1 tablet (10 mg total) by mouth at bedtime.    Dispense:  90 tablet    Refill:  3    Order Specific Question:   Supervising Provider    Answer:   Ermalene Searing, AMY E [[id number]]    Follow-up: Return in about 1 year (around 01/16/2024) for f/u CPE.    Mort Sawyers, FNP

## 2023-01-19 ENCOUNTER — Other Ambulatory Visit: Payer: Self-pay | Admitting: Family

## 2023-01-19 ENCOUNTER — Telehealth: Payer: Self-pay

## 2023-01-19 DIAGNOSIS — J45909 Unspecified asthma, uncomplicated: Secondary | ICD-10-CM | POA: Insufficient documentation

## 2023-01-19 DIAGNOSIS — R768 Other specified abnormal immunological findings in serum: Secondary | ICD-10-CM | POA: Insufficient documentation

## 2023-01-19 DIAGNOSIS — R7303 Prediabetes: Secondary | ICD-10-CM

## 2023-01-19 MED ORDER — WEGOVY 0.25 MG/0.5ML ~~LOC~~ SOAJ
SUBCUTANEOUS | 0 refills | Status: DC
Start: 2023-01-19 — End: 2023-01-19

## 2023-01-19 MED ORDER — ZEPBOUND 2.5 MG/0.5ML ~~LOC~~ SOAJ
2.5000 mg | SUBCUTANEOUS | 0 refills | Status: DC
Start: 2023-01-19 — End: 2023-01-19

## 2023-01-19 MED ORDER — ZEPBOUND 5 MG/0.5ML ~~LOC~~ SOAJ: 5.0000 mg | SUBCUTANEOUS | 0 refills | Status: DC

## 2023-01-19 MED ORDER — WEGOVY 0.5 MG/0.5ML ~~LOC~~ SOAJ
SUBCUTANEOUS | 2 refills | Status: DC
Start: 2023-01-19 — End: 2023-01-19

## 2023-01-19 MED ORDER — ZEPBOUND 5 MG/0.5ML ~~LOC~~ SOAJ
5.0000 mg | SUBCUTANEOUS | 0 refills | Status: DC
Start: 2023-01-19 — End: 2023-01-19

## 2023-01-19 MED ORDER — ZEPBOUND 2.5 MG/0.5ML ~~LOC~~ SOAJ
2.5000 mg | SUBCUTANEOUS | 0 refills | Status: AC
Start: 2023-01-19 — End: ?

## 2023-01-19 NOTE — Assessment & Plan Note (Signed)
Smoking cessation instruction/counseling given:  counseled patient on the dangers of tobacco use, advised patient to stop smoking, and reviewed strategies to maximize success 

## 2023-01-19 NOTE — Assessment & Plan Note (Signed)
Suspected asthma exacerbation Cxr ordered pending results.  Start singulair 10 mg nightly

## 2023-01-19 NOTE — Assessment & Plan Note (Signed)
Continue sertraline 150 mg once daily.  Doing well, stable.

## 2023-01-19 NOTE — Assessment & Plan Note (Signed)
Ordered lipid panel, pending results. Work on low cholesterol diet and exercise as tolerated  

## 2023-01-19 NOTE — Assessment & Plan Note (Signed)
Pt advised to work on diet and exercise as tolerated  

## 2023-01-19 NOTE — Assessment & Plan Note (Signed)
Workup in place today for autoimmune disease.  H/o positive RF and she states never went for workup, will repeat ANA RF sed rate crp pending results

## 2023-01-19 NOTE — Addendum Note (Signed)
Addended by: Mort Sawyers on: 01/19/2023 02:23 PM   Modules accepted: Orders

## 2023-01-19 NOTE — Assessment & Plan Note (Signed)
Ordered b12 pending results  °

## 2023-01-19 NOTE — Addendum Note (Signed)
Addended by: Mort Sawyers on: 01/19/2023 03:11 PM   Modules accepted: Orders

## 2023-01-19 NOTE — Assessment & Plan Note (Addendum)
A1c ordered  Pending results.  Pt advised of the following: Work on a diabetic diet, try to incorporate exercise at least 20-30 a day for 3 days a week or more.

## 2023-01-21 DIAGNOSIS — F411 Generalized anxiety disorder: Secondary | ICD-10-CM | POA: Diagnosis not present

## 2023-01-21 DIAGNOSIS — F410 Panic disorder [episodic paroxysmal anxiety] without agoraphobia: Secondary | ICD-10-CM | POA: Diagnosis not present

## 2023-01-23 ENCOUNTER — Other Ambulatory Visit: Payer: BC Managed Care – PPO

## 2023-01-27 NOTE — Telephone Encounter (Signed)
Averii your prior auth for zepbound was denied.   At this time also, there is no availability for zepbound and or the other GLP 1's. I have had patients having a lot of problems finding them anywhere.   Since this is not an action, would you be interested in a referral to [facility]?

## 2023-01-27 NOTE — Telephone Encounter (Signed)
Unable to reach pt by phone and left v/m requesting pt to [phone].

## 2023-01-28 DIAGNOSIS — D518 Other vitamin B12 deficiency anemias: Secondary | ICD-10-CM | POA: Diagnosis not present

## 2023-01-28 DIAGNOSIS — F410 Panic disorder [episodic paroxysmal anxiety] without agoraphobia: Secondary | ICD-10-CM | POA: Diagnosis not present

## 2023-01-28 DIAGNOSIS — F411 Generalized anxiety disorder: Secondary | ICD-10-CM | POA: Diagnosis not present

## 2023-01-28 NOTE — Telephone Encounter (Signed)
Message has been sent to MyChart.

## 2023-02-06 DIAGNOSIS — F411 Generalized anxiety disorder: Secondary | ICD-10-CM | POA: Diagnosis not present

## 2023-02-06 DIAGNOSIS — F410 Panic disorder [episodic paroxysmal anxiety] without agoraphobia: Secondary | ICD-10-CM | POA: Diagnosis not present

## 2023-02-17 ENCOUNTER — Telehealth: Payer: Self-pay

## 2023-02-17 ENCOUNTER — Other Ambulatory Visit (HOSPITAL_COMMUNITY): Payer: Self-pay

## 2023-02-17 NOTE — Telephone Encounter (Signed)
Patient Advocate Encounter  Prior Authorization for Zepbound has been approved with [facility].    Effective dates: 02/17/23 through 10/20/23  Per [facility] test claim, copay for 28 days supply is $24.99 (with eVoucher)

## 2023-02-17 NOTE — Telephone Encounter (Signed)
Approval letter in charts

## 2023-02-20 ENCOUNTER — Other Ambulatory Visit: Payer: Self-pay | Admitting: Family

## 2023-02-20 DIAGNOSIS — J4520 Mild intermittent asthma, uncomplicated: Secondary | ICD-10-CM

## 2023-02-23 DIAGNOSIS — F410 Panic disorder [episodic paroxysmal anxiety] without agoraphobia: Secondary | ICD-10-CM | POA: Diagnosis not present

## 2023-02-23 DIAGNOSIS — F411 Generalized anxiety disorder: Secondary | ICD-10-CM | POA: Diagnosis not present

## 2023-03-19 ENCOUNTER — Ambulatory Visit: Payer: BC Managed Care – PPO | Admitting: Cardiology

## 2023-04-03 DIAGNOSIS — F410 Panic disorder [episodic paroxysmal anxiety] without agoraphobia: Secondary | ICD-10-CM | POA: Diagnosis not present

## 2023-04-03 DIAGNOSIS — F411 Generalized anxiety disorder: Secondary | ICD-10-CM | POA: Diagnosis not present

## 2023-04-05 ENCOUNTER — Encounter: Payer: Self-pay | Admitting: *Deleted

## 2023-04-05 ENCOUNTER — Emergency Department: Payer: BC Managed Care – PPO

## 2023-04-05 ENCOUNTER — Other Ambulatory Visit: Payer: Self-pay

## 2023-04-05 DIAGNOSIS — M546 Pain in thoracic spine: Secondary | ICD-10-CM | POA: Insufficient documentation

## 2023-04-05 DIAGNOSIS — Z5321 Procedure and treatment not carried out due to patient leaving prior to being seen by health care provider: Secondary | ICD-10-CM | POA: Diagnosis not present

## 2023-04-05 DIAGNOSIS — N644 Mastodynia: Secondary | ICD-10-CM | POA: Insufficient documentation

## 2023-04-05 DIAGNOSIS — R079 Chest pain, unspecified: Secondary | ICD-10-CM | POA: Diagnosis not present

## 2023-04-05 DIAGNOSIS — R0781 Pleurodynia: Secondary | ICD-10-CM | POA: Diagnosis not present

## 2023-04-05 LAB — BASIC METABOLIC PANEL
Anion gap: 10 (ref 5–15)
BUN: 11 mg/dL (ref 6–20)
CO2: 22 mmol/L (ref 22–32)
Calcium: 9.1 mg/dL (ref 8.9–10.3)
Chloride: 105 mmol/L (ref 98–111)
Creatinine, Ser: 0.84 mg/dL (ref 0.44–1.00)
GFR, Estimated: >60 mL/min (ref >60)
Glucose, Bld: 102 mg/dL — ABNORMAL HIGH (ref 70–99)
Potassium: 3.9 mmol/L (ref 3.5–5.1)
Sodium: 137 mmol/L (ref 135–145)

## 2023-04-05 LAB — CBC
HCT: 42.7 % (ref 36.0–46.0)
Hemoglobin: 14.6 g/dL (ref 12.0–15.0)
MCH: 30 pg (ref 26.0–34.0)
MCHC: 34.2 g/dL (ref 30.0–36.0)
MCV: 87.9 fL (ref 80.0–100.0)
Platelets: 188 10*3/uL (ref 150–400)
RBC: 4.86 MIL/uL (ref 3.87–5.11)
RDW: 13.5 % (ref 11.5–15.5)
WBC: 11.3 10*3/uL — ABNORMAL HIGH (ref 4.0–10.5)
nRBC: 0 % (ref 0.0–0.2)

## 2023-04-05 LAB — TROPONIN I (HIGH SENSITIVITY): Troponin I (High Sensitivity): 3 ng/L (ref <18)

## 2023-04-05 NOTE — ED Triage Notes (Signed)
Pt reporting for 1 week she has been having "random and sharp pain under the left ribs/breast" lasting 30 seconds to a minute. Left upper back pain that started about 1 day ago. Denies SOB.

## 2023-04-06 ENCOUNTER — Emergency Department
Admission: EM | Admit: 2023-04-06 | Discharge: 2023-04-06 | Discharge status: Against Medical Advice | Payer: BC Managed Care – PPO | Source: Home / Self Care

## 2023-04-06 NOTE — ED Notes (Signed)
Pt called from the lobby for repeat VS and trop - no answer - not seen in the lobby.

## 2023-04-07 NOTE — Transitions of Care (Post Inpatient/ED Visit) (Signed)
   04/07/2023  Name: Yesenia Beasley MRN: [id number] DOB: [birth year]  Today's TOC FU Call Status: Today's TOC FU Call Status:: Unsuccessul Call (1st Attempt) Unsuccessful Call (1st Attempt) Date: 04/07/23  Attempted to reach the patient regarding the most recent Inpatient/ED visit.  Follow Up Plan: Additional outreach attempts will be made to reach the patient to complete the Transitions of Care (Post Inpatient/ED visit) call.   Signature   Woodfin Ganja LPN [facility] Nurse Health Advisor Direct Dial [phone]

## 2023-04-08 NOTE — Transitions of Care (Post Inpatient/ED Visit) (Signed)
   04/08/2023  Name: Yesenia Beasley MRN: [id number] DOB: [birth year]  Today's TOC FU Call Status: Today's TOC FU Call Status:: Unsuccessful Call (2nd Attempt) Unsuccessful Call (1st Attempt) Date: 04/07/23 Unsuccessful Call (2nd Attempt) Date: 04/08/23  Attempted to reach the patient regarding the most recent Inpatient/ED visit.  Follow Up Plan: Additional outreach attempts will be made to reach the patient to complete the Transitions of Care (Post Inpatient/ED visit) call.   Signature   Woodfin Ganja LPN [facility] Nurse Health Advisor Direct Dial [phone]

## 2023-04-10 NOTE — Transitions of Care (Post Inpatient/ED Visit) (Signed)
Unable to reach pt by phone and left v/m requesting cb [phone]. Per note pt LWBS at [facility] ED. Sending note to Hayden Pedro FNP.      04/10/2023  Name: Yesenia Beasley MRN: [id number] DOB: 1986/03/05  Today's TOC FU Call Status: Today's TOC FU Call Status:: Unsuccessful Call (3rd Attempt) Unsuccessful Call (1st Attempt) Date: 04/07/23 Unsuccessful Call (2nd Attempt) Date: 04/08/23 Unsuccessful Call (3rd Attempt) Date: 04/10/23  Attempted to reach the patient regarding the most recent Inpatient/ED visit.  Follow Up Plan: No further outreach attempts will be made at this time. We have been unable to contact the patient.  Signature  Lewanda Rife, LPN

## 2023-04-11 ENCOUNTER — Other Ambulatory Visit: Payer: Self-pay | Admitting: Family

## 2023-04-11 DIAGNOSIS — J4520 Mild intermittent asthma, uncomplicated: Secondary | ICD-10-CM

## 2023-04-20 DIAGNOSIS — F410 Panic disorder [episodic paroxysmal anxiety] without agoraphobia: Secondary | ICD-10-CM | POA: Diagnosis not present

## 2023-04-20 DIAGNOSIS — F411 Generalized anxiety disorder: Secondary | ICD-10-CM | POA: Diagnosis not present

## 2023-04-27 DIAGNOSIS — F410 Panic disorder [episodic paroxysmal anxiety] without agoraphobia: Secondary | ICD-10-CM | POA: Diagnosis not present

## 2023-04-27 DIAGNOSIS — F411 Generalized anxiety disorder: Secondary | ICD-10-CM | POA: Diagnosis not present

## 2023-04-27 DIAGNOSIS — D518 Other vitamin B12 deficiency anemias: Secondary | ICD-10-CM | POA: Diagnosis not present

## 2023-04-27 NOTE — Telephone Encounter (Signed)
Error

## 2023-05-12 DIAGNOSIS — F411 Generalized anxiety disorder: Secondary | ICD-10-CM | POA: Diagnosis not present

## 2023-05-12 DIAGNOSIS — F410 Panic disorder [episodic paroxysmal anxiety] without agoraphobia: Secondary | ICD-10-CM | POA: Diagnosis not present

## 2023-05-21 ENCOUNTER — Ambulatory Visit: Payer: BC Managed Care – PPO | Attending: Cardiology | Admitting: Cardiology

## 2023-05-22 ENCOUNTER — Encounter: Payer: Self-pay | Admitting: Cardiology

## 2023-06-03 ENCOUNTER — Other Ambulatory Visit: Payer: Self-pay | Admitting: Family

## 2023-06-03 DIAGNOSIS — J4520 Mild intermittent asthma, uncomplicated: Secondary | ICD-10-CM

## 2023-06-15 DIAGNOSIS — F410 Panic disorder [episodic paroxysmal anxiety] without agoraphobia: Secondary | ICD-10-CM | POA: Diagnosis not present

## 2023-06-15 DIAGNOSIS — F411 Generalized anxiety disorder: Secondary | ICD-10-CM | POA: Diagnosis not present

## 2023-07-13 DIAGNOSIS — F410 Panic disorder [episodic paroxysmal anxiety] without agoraphobia: Secondary | ICD-10-CM | POA: Diagnosis not present

## 2023-07-13 DIAGNOSIS — F411 Generalized anxiety disorder: Secondary | ICD-10-CM | POA: Diagnosis not present

## 2023-07-23 ENCOUNTER — Other Ambulatory Visit: Payer: Self-pay | Admitting: Family

## 2023-07-23 DIAGNOSIS — J4520 Mild intermittent asthma, uncomplicated: Secondary | ICD-10-CM

## 2023-08-10 DIAGNOSIS — F411 Generalized anxiety disorder: Secondary | ICD-10-CM | POA: Diagnosis not present

## 2023-08-10 DIAGNOSIS — F410 Panic disorder [episodic paroxysmal anxiety] without agoraphobia: Secondary | ICD-10-CM | POA: Diagnosis not present

## 2023-09-08 ENCOUNTER — Other Ambulatory Visit: Payer: Self-pay | Admitting: Family

## 2023-09-08 DIAGNOSIS — J4520 Mild intermittent asthma, uncomplicated: Secondary | ICD-10-CM

## 2023-09-08 NOTE — Telephone Encounter (Signed)
Pt has filled this within the last one month.  Is she still experiencing wheezing and or using this daily or frequently during the week?   If yes she needs to be seen in the office

## 2023-09-21 DIAGNOSIS — F410 Panic disorder [episodic paroxysmal anxiety] without agoraphobia: Secondary | ICD-10-CM | POA: Diagnosis not present

## 2023-09-21 DIAGNOSIS — F411 Generalized anxiety disorder: Secondary | ICD-10-CM | POA: Diagnosis not present

## 2023-09-27 ENCOUNTER — Ambulatory Visit: Payer: Self-pay

## 2023-10-23 ENCOUNTER — Encounter: Payer: Self-pay | Admitting: Family

## 2023-10-23 ENCOUNTER — Other Ambulatory Visit: Payer: Self-pay | Admitting: Family

## 2023-10-23 DIAGNOSIS — J4520 Mild intermittent asthma, uncomplicated: Secondary | ICD-10-CM

## 2023-10-26 ENCOUNTER — Other Ambulatory Visit (HOSPITAL_COMMUNITY): Payer: Self-pay

## 2023-10-26 ENCOUNTER — Telehealth: Payer: Self-pay | Admitting: Pharmacy Technician

## 2023-10-26 NOTE — Telephone Encounter (Signed)
Pharmacy Patient Advocate Encounter  Received notification from [facility] that Prior Authorization for Zepbound 2.5MG /0.5ML pen-injectors  has been APPROVED from 10/26/2023 to 06/22/2024. Ran test claim, Copay is $24.99. This test claim was processed through [facility]- copay amounts may vary at other pharmacies due to pharmacy/plan contracts, or as the patient moves through the different stages of their insurance plan.   Key: [id number] PA #/Case ID/Reference #: [id number]

## 2023-10-26 NOTE — Telephone Encounter (Signed)
Can you please call patient to inquire of the MyChart message?  She did not respond to the message.

## 2023-11-02 MED ORDER — LEVALBUTEROL TARTRATE 45 MCG/ACT IN AERO
2.0000 | INHALATION_SPRAY | Freq: Four times a day (QID) | RESPIRATORY_TRACT | 1 refills | Status: DC | PRN
Start: 2023-11-02 — End: 2023-12-22

## 2023-11-02 NOTE — Addendum Note (Signed)
 Addended by: Mort Sawyers on: 11/02/2023 01:57 PM   Modules accepted: Orders

## 2023-12-22 ENCOUNTER — Other Ambulatory Visit: Payer: Self-pay | Admitting: Family

## 2023-12-22 DIAGNOSIS — J4520 Mild intermittent asthma, uncomplicated: Secondary | ICD-10-CM

## 2024-02-16 ENCOUNTER — Other Ambulatory Visit: Payer: Self-pay | Admitting: Family

## 2024-02-16 DIAGNOSIS — J4520 Mild intermittent asthma, uncomplicated: Secondary | ICD-10-CM

## 2024-03-04 ENCOUNTER — Other Ambulatory Visit: Payer: Self-pay

## 2024-03-14 ENCOUNTER — Telehealth: Payer: Self-pay | Admitting: Family

## 2024-03-14 ENCOUNTER — Ambulatory Visit (INDEPENDENT_AMBULATORY_CARE_PROVIDER_SITE_OTHER): Payer: Self-pay | Admitting: Family

## 2024-03-14 ENCOUNTER — Ambulatory Visit: Payer: Self-pay | Admitting: Family

## 2024-03-14 ENCOUNTER — Telehealth: Payer: Self-pay

## 2024-03-14 ENCOUNTER — Other Ambulatory Visit (HOSPITAL_COMMUNITY): Payer: Self-pay

## 2024-03-14 ENCOUNTER — Encounter: Payer: Self-pay | Admitting: Family

## 2024-03-14 VITALS — BP 128/86 | HR 82 | Temp 97.6°F | Ht 65.0 in | Wt 313.0 lb

## 2024-03-14 DIAGNOSIS — E538 Deficiency of other specified B group vitamins: Secondary | ICD-10-CM

## 2024-03-14 DIAGNOSIS — R768 Other specified abnormal immunological findings in serum: Secondary | ICD-10-CM | POA: Diagnosis not present

## 2024-03-14 DIAGNOSIS — Z720 Tobacco use: Secondary | ICD-10-CM

## 2024-03-14 DIAGNOSIS — E782 Mixed hyperlipidemia: Secondary | ICD-10-CM

## 2024-03-14 DIAGNOSIS — Z6841 Body Mass Index (BMI) 40.0 and over, adult: Secondary | ICD-10-CM

## 2024-03-14 DIAGNOSIS — R7303 Prediabetes: Secondary | ICD-10-CM | POA: Diagnosis not present

## 2024-03-14 DIAGNOSIS — R110 Nausea: Secondary | ICD-10-CM

## 2024-03-14 DIAGNOSIS — M2550 Pain in unspecified joint: Secondary | ICD-10-CM | POA: Diagnosis not present

## 2024-03-14 LAB — LIPID PANEL
Cholesterol: 160 mg/dL (ref 0–200)
HDL: 25.5 mg/dL — ABNORMAL LOW (ref >39.00)
LDL Cholesterol: 107 mg/dL — ABNORMAL HIGH (ref 0–99)
NonHDL: 134.54
Total CHOL/HDL Ratio: 6
Triglycerides: 136 mg/dL (ref 0.0–149.0)
VLDL: 27.2 mg/dL (ref 0.0–40.0)

## 2024-03-14 LAB — SEDIMENTATION RATE: Sed Rate: 6 mm/h (ref 0–20)

## 2024-03-14 LAB — CBC
HCT: 45.1 % (ref 36.0–46.0)
Hemoglobin: 15.3 g/dL — ABNORMAL HIGH (ref 12.0–15.0)
MCHC: 33.9 g/dL (ref 30.0–36.0)
MCV: 87.6 fl (ref 78.0–100.0)
Platelets: 161 K/uL (ref 150.0–400.0)
RBC: 5.14 Mil/uL — ABNORMAL HIGH (ref 3.87–5.11)
RDW: 14.2 % (ref 11.5–15.5)
WBC: 8.7 K/uL (ref 4.0–10.5)

## 2024-03-14 LAB — COMPREHENSIVE METABOLIC PANEL WITH GFR
ALT: 13 U/L (ref 0–35)
AST: 9 U/L (ref 0–37)
Albumin: 4.3 g/dL (ref 3.5–5.2)
Alkaline Phosphatase: 67 U/L (ref 39–117)
BUN: 9 mg/dL (ref 6–23)
CO2: 26 meq/L (ref 19–32)
Calcium: 9 mg/dL (ref 8.4–10.5)
Chloride: 102 meq/L (ref 96–112)
Creatinine, Ser: 0.72 mg/dL (ref 0.40–1.20)
GFR: 104.97 mL/min (ref >60.00)
Glucose, Bld: 100 mg/dL — ABNORMAL HIGH (ref 70–99)
Potassium: 4.2 meq/L (ref 3.5–5.1)
Sodium: 135 meq/L (ref 135–145)
Total Bilirubin: 0.5 mg/dL (ref 0.2–1.2)
Total Protein: 6.4 g/dL (ref 6.0–8.3)

## 2024-03-14 LAB — HEMOGLOBIN A1C: Hgb A1c MFr Bld: 5.9 % (ref 4.6–6.5)

## 2024-03-14 LAB — C-REACTIVE PROTEIN: CRP: <1 mg/dL (ref 0.5–20.0)

## 2024-03-14 LAB — T3, FREE: T3, Free: 3.3 pg/mL (ref 2.3–4.2)

## 2024-03-14 LAB — TSH: TSH: 2.95 u[IU]/mL (ref 0.35–5.50)

## 2024-03-14 LAB — VITAMIN B12: Vitamin B-12: 155 pg/mL — ABNORMAL LOW (ref 211–911)

## 2024-03-14 LAB — T4, FREE: Free T4: 0.61 ng/dL (ref 0.60–1.60)

## 2024-03-14 MED ORDER — WEGOVY 0.25 MG/0.5ML ~~LOC~~ SOAJ
SUBCUTANEOUS | 0 refills | Status: AC
Start: 2024-03-14 — End: ?

## 2024-03-14 MED ORDER — ONDANSETRON HCL 4 MG PO TABS: 4.0000 mg | ORAL_TABLET | Freq: Three times a day (TID) | ORAL | 0 refills | Status: DC | PRN

## 2024-03-14 MED ORDER — WEGOVY 0.5 MG/0.5ML ~~LOC~~ SOAJ
SUBCUTANEOUS | 0 refills | Status: AC
Start: 2024-04-04 — End: ?

## 2024-03-14 MED ORDER — WEGOVY 1 MG/0.5ML ~~LOC~~ SOAJ
1.0000 mg | SUBCUTANEOUS | 0 refills | Status: AC
Start: 2024-04-25 — End: ?

## 2024-03-14 NOTE — Assessment & Plan Note (Signed)
 Ordered lipid panel, pending results. Work on low cholesterol diet and exercise as tolerated

## 2024-03-14 NOTE — Assessment & Plan Note (Signed)
 Autoimmune workup pending results.

## 2024-03-14 NOTE — Telephone Encounter (Signed)
 Can we please start prior auth wegovy 

## 2024-03-14 NOTE — Assessment & Plan Note (Signed)
 The beneficiary does not have any FDA labeled contraindications to the requested agent including pregnancy, lactation, h/o medullary thyroid  cancer or multiple endocrine neoplasia type II.  Trial start wegovy  pending insurance

## 2024-03-14 NOTE — Progress Notes (Signed)
 Established Patient Office Visit  Subjective:      CC:  Chief Complaint  Patient presents with   Medical Management of Chronic Issues    F/u for medication refills  Wants to discuss restarting GLP-1    HPI: Yesenia Beasley is a 38 y.o. female presenting on 03/14/2024 for Medical Management of Chronic Issues (F/u for medication refills /Wants to discuss restarting GLP-1)  Obesity: pt would like to restart wegovy  as she was successful with this. She had had minor nausea  Exercise: 30 minutes walking 7 days a week walking her dogs. Yoga few times a week.   Wt Readings from Last 3 Encounters:  03/14/24 (!) 313 lb (142 kg)  04/05/23 295 lb (133.8 kg)  01/16/23 (!) 311 lb 6.4 oz (141.3 kg)      New complaints: Still c/o joint pains with history of positive RF years ago. She never followed up with this. Does have stiffness in the joints especially in the am. Joint pains in the shoulders and bil knees     Social history:  Relevant past medical, surgical, family and social history reviewed and updated as indicated. Interim medical history since our last visit reviewed.  Allergies and medications reviewed and updated.  DATA REVIEWED: CHART IN EPIC     ROS: Negative unless specifically indicated above in HPI.    Current Outpatient Medications:    cholecalciferol (VITAMIN D3) 25 MCG (1000 UNIT) tablet, Take 1,000 Units by mouth daily., Disp: , Rfl:    Cyanocobalamin  (VITAMIN B-12) 2500 MCG SUBL, Place under the tongue., Disp: , Rfl:    levalbuterol  (XOPENEX  HFA) 45 MCG/ACT inhaler, INHALE 2 PUFFS INTO THE LUNGS EVERY 6 HOURS AS NEEDED FOR WHEEZE, Disp: 15 each, Rfl: 1   montelukast  (SINGULAIR ) 10 MG tablet, Take 1 tablet (10 mg total) by mouth at bedtime., Disp: 90 tablet, Rfl: 3   ondansetron  (ZOFRAN ) 4 MG tablet, Take 1 tablet (4 mg total) by mouth every 8 (eight) hours as needed for nausea or vomiting., Disp: 20 tablet, Rfl: 0   Rimegepant Sulfate 75 MG TBDP, Take 1 tablet  by mouth as needed., Disp: , Rfl:    Semaglutide -Weight Management (WEGOVY ) 0.25 MG/0.5ML SOAJ, Start 0.25 mg qweek for four weeks, then increase to 0.5 mg dose qweek, Disp: 2 mL, Rfl: 0   [START ON 04/04/2024] Semaglutide -Weight Management (WEGOVY ) 0.5 MG/0.5ML SOAJ, Inject 0.5 mg [place] weekly, Disp: 2 mL, Rfl: 0   [START ON 04/25/2024] Semaglutide -Weight Management (WEGOVY ) 1 MG/0.5ML SOAJ, Inject 1 mg into the skin once a week., Disp: 6 mL, Rfl: 0   sertraline  (ZOLOFT ) 100 MG tablet, Take 1.5 tablets once daily, Disp: 180 tablet, Rfl: 0   tirzepatide  (ZEPBOUND ) 2.5 MG/0.5ML Pen, Inject 2.5 mg into the skin once a week. Once complete with four weeks of weekly injection will increase to 5 mg Qweek with new RX (Patient not taking: Reported on 03/14/2024), Disp: 2 mL, Rfl: 0      Objective:    BP 128/86   Pulse 82   Temp 97.6 F (36.4 C) (Temporal)   Ht 5' 5 (1.651 m)   Wt (!) 313 lb (142 kg)   LMP 02/16/2024 (Exact Date)   SpO2 97%   BMI 52.09 kg/m   Wt Readings from Last 3 Encounters:  03/14/24 (!) 313 lb (142 kg)  04/05/23 295 lb (133.8 kg)  01/16/23 (!) 311 lb 6.4 oz (141.3 kg)    Physical Exam Vitals reviewed.  Constitutional:  General: She is not in acute distress.    Appearance: Normal appearance. She is normal weight. She is not ill-appearing, toxic-appearing or diaphoretic.  HENT:     Head: Normocephalic.  Cardiovascular:     Rate and Rhythm: Normal rate and regular rhythm.  Pulmonary:     Effort: Pulmonary effort is normal.     Breath sounds: Normal breath sounds.  Musculoskeletal:        General: Normal range of motion.  Neurological:     General: No focal deficit present.     Mental Status: She is alert and oriented to person, place, and time. Mental status is at baseline.  Psychiatric:        Mood and Affect: Mood normal.        Behavior: Behavior normal.        Thought Content: Thought content normal.        Judgment: Judgment normal.            Assessment & Plan:  Mixed hyperlipidemia Assessment & Plan: Ordered lipid panel, pending results. Work on low cholesterol diet and exercise as tolerated   Orders: -     Wegovy ; Inject 0.5 mg [place] weekly  Dispense: 2 mL; Refill: 0 -     Wegovy ; Start 0.25 mg qweek for four weeks, then increase to 0.5 mg dose qweek  Dispense: 2 mL; Refill: 0 -     Wegovy ; Inject 1 mg into the skin once a week.  Dispense: 6 mL; Refill: 0 -     Lipid panel  Morbid obesity (HCC) Assessment & Plan: The beneficiary does not have any FDA labeled contraindications to the requested agent including pregnancy, lactation, h/o medullary thyroid  cancer or multiple endocrine neoplasia type II.  Trial start wegovy  pending insurance    Orders: -     Wegovy ; Inject 0.5 mg [place] weekly  Dispense: 2 mL; Refill: 0 -     Wegovy ; Start 0.25 mg qweek for four weeks, then increase to 0.5 mg dose qweek  Dispense: 2 mL; Refill: 0 -     Wegovy ; Inject 1 mg into the skin once a week.  Dispense: 6 mL; Refill: 0 -     TSH -     T4, free -     T3, free  Prediabetes Assessment & Plan: Pt advised of the following: Work on a diabetic diet, try to incorporate exercise at least 20-30 a day for 3 days a week or more.    Orders: -     Wegovy ; Inject 0.5 mg [place] weekly  Dispense: 2 mL; Refill: 0 -     Wegovy ; Start 0.25 mg qweek for four weeks, then increase to 0.5 mg dose qweek  Dispense: 2 mL; Refill: 0 -     Wegovy ; Inject 1 mg into the skin once a week.  Dispense: 6 mL; Refill: 0 -     Comprehensive metabolic panel with GFR -     Hemoglobin A1c  Nausea -     Ondansetron  HCl; Take 1 tablet (4 mg total) by mouth every 8 (eight) hours as needed for nausea or vomiting.  Dispense: 20 tablet; Refill: 0  Rheumatoid factor positive -     ANA w/Reflex -     C-reactive protein -     Sedimentation rate -     Rheumatoid factor  Vitamin B 12 deficiency -     Vitamin B12 -     CBC  Polyarthralgia Assessment & Plan: Autoimmune  workup  pending results.    Orders: -     ANA w/Reflex -     C-reactive protein -     Sedimentation rate -     Rheumatoid factor  Tobacco abuse Assessment & Plan: Smoking cessation instruction/counseling given:  counseled patient on the dangers of tobacco use, advised patient to stop smoking, and reviewed strategies to maximize success       Return in about 3 months (around 06/14/2024) for f/u CPE.  Ginger Patrick, MSN, APRN, FNP-C [place] [facility]

## 2024-03-14 NOTE — Telephone Encounter (Signed)
 Pharmacy Patient Advocate Encounter   Received notification from Pt Calls Messages that prior authorization for Wegovy  0.25 is required/requested.   Insurance verification completed.   The patient is insured through [facility] [employer] .   Per test claim: The current 28 day co-pay is, $24.99.  No PA needed at this time. This test claim was processed through [facility]- copay amounts may vary at other pharmacies due to pharmacy/plan contracts, or as the patient moves through the different stages of their insurance plan.

## 2024-03-14 NOTE — Assessment & Plan Note (Signed)
 Smoking cessation instruction/counseling given:  counseled patient on the dangers of tobacco use, advised patient to stop smoking, and reviewed strategies to maximize success

## 2024-03-14 NOTE — Assessment & Plan Note (Signed)
 Pt advised of the following: Work on a diabetic diet, try to incorporate exercise at least 20-30 a day for 3 days a week or more.

## 2024-03-15 LAB — ANA W/REFLEX: ANA Titer 1: NEGATIVE

## 2024-03-15 LAB — RHEUMATOID FACTOR: Rheumatoid fact SerPl-aCnc: <10 [IU]/mL (ref <14)

## 2024-04-05 ENCOUNTER — Other Ambulatory Visit: Payer: Self-pay | Admitting: Family

## 2024-04-05 DIAGNOSIS — J4520 Mild intermittent asthma, uncomplicated: Secondary | ICD-10-CM

## 2024-05-20 ENCOUNTER — Other Ambulatory Visit: Payer: Self-pay | Admitting: Family

## 2024-05-20 DIAGNOSIS — J4520 Mild intermittent asthma, uncomplicated: Secondary | ICD-10-CM

## 2024-06-20 MED ORDER — LEVALBUTEROL TARTRATE 45 MCG/ACT IN AERO: 2.0000 | INHALATION_SPRAY | Freq: Four times a day (QID) | RESPIRATORY_TRACT | 1 refills | Status: DC | PRN

## 2024-06-20 NOTE — Addendum Note (Signed)
 Addended by: CORWIN ANTU on: 06/20/2024 03:56 PM   Modules accepted: Orders

## 2024-06-22 ENCOUNTER — Other Ambulatory Visit: Payer: Self-pay | Admitting: *Deleted

## 2024-06-22 DIAGNOSIS — R110 Nausea: Secondary | ICD-10-CM

## 2024-06-22 MED ORDER — ONDANSETRON HCL 4 MG PO TABS
4.0000 mg | ORAL_TABLET | Freq: Three times a day (TID) | ORAL | 0 refills | Status: AC | PRN
Start: 2024-06-22 — End: ?

## 2024-06-23 ENCOUNTER — Telehealth: Payer: Self-pay

## 2024-06-23 ENCOUNTER — Other Ambulatory Visit (HOSPITAL_COMMUNITY): Payer: Self-pay

## 2024-06-23 NOTE — Telephone Encounter (Signed)
 Pharmacy Patient Advocate Encounter   Received notification from Onbase that prior authorization for Zepbound  2.5 is required/requested.   Insurance verification completed.   The patient is insured through [facility]   Per test claim: Per test claim, medication is not covered due to plan/benefit exclusion, PA not submitted at this time  Patient had a Wegovy  PA that expired 06/22/24

## 2024-07-04 ENCOUNTER — Other Ambulatory Visit (HOSPITAL_COMMUNITY): Payer: Self-pay

## 2024-08-03 ENCOUNTER — Other Ambulatory Visit: Payer: Self-pay | Admitting: Family

## 2024-08-03 DIAGNOSIS — J4520 Mild intermittent asthma, uncomplicated: Secondary | ICD-10-CM

## 2024-10-06 ENCOUNTER — Other Ambulatory Visit: Payer: Self-pay | Admitting: Family

## 2024-10-06 DIAGNOSIS — J4520 Mild intermittent asthma, uncomplicated: Secondary | ICD-10-CM
